# Patient Record
Sex: Male | Born: 1960 | Race: Black or African American | Hispanic: No | Marital: Single | Smoking: Never smoker
Health system: Southern US, Community
[De-identification: ages and names within clinical notes are randomized; demographics above are authoritative.]

## PROBLEM LIST (undated history)

## (undated) DIAGNOSIS — I10 Essential (primary) hypertension: Secondary | ICD-10-CM

## (undated) DIAGNOSIS — M1990 Unspecified osteoarthritis, unspecified site: Secondary | ICD-10-CM

## (undated) DIAGNOSIS — E785 Hyperlipidemia, unspecified: Secondary | ICD-10-CM

## (undated) DIAGNOSIS — K429 Umbilical hernia without obstruction or gangrene: Secondary | ICD-10-CM

## (undated) DIAGNOSIS — N183 Chronic kidney disease, stage 3 unspecified: Secondary | ICD-10-CM

## (undated) DIAGNOSIS — M109 Gout, unspecified: Secondary | ICD-10-CM

## (undated) DIAGNOSIS — G459 Transient cerebral ischemic attack, unspecified: Secondary | ICD-10-CM

## (undated) DIAGNOSIS — I351 Nonrheumatic aortic (valve) insufficiency: Secondary | ICD-10-CM

## (undated) DIAGNOSIS — I639 Cerebral infarction, unspecified: Secondary | ICD-10-CM

## (undated) DIAGNOSIS — R011 Cardiac murmur, unspecified: Secondary | ICD-10-CM

## (undated) HISTORY — DX: Chronic kidney disease, stage 3 unspecified: N18.30

## (undated) HISTORY — DX: Transient cerebral ischemic attack, unspecified: G45.9

## (undated) HISTORY — DX: Hyperlipidemia, unspecified: E78.5

## (undated) HISTORY — DX: Nonrheumatic aortic (valve) insufficiency: I35.1

---

## 1998-03-25 ENCOUNTER — Emergency Department (HOSPITAL_COMMUNITY): Admission: EM | Admit: 1998-03-25 | Discharge: 1998-03-25 | Payer: Self-pay | Admitting: Emergency Medicine

## 1998-10-22 ENCOUNTER — Encounter: Payer: Self-pay | Admitting: Emergency Medicine

## 1998-10-22 ENCOUNTER — Ambulatory Visit (HOSPITAL_BASED_OUTPATIENT_CLINIC_OR_DEPARTMENT_OTHER): Admission: RE | Admit: 1998-10-22 | Discharge: 1998-10-22 | Payer: Self-pay | Admitting: Orthopedic Surgery

## 1998-10-22 ENCOUNTER — Emergency Department (HOSPITAL_COMMUNITY): Admission: EM | Admit: 1998-10-22 | Discharge: 1998-10-22 | Payer: Self-pay | Admitting: Emergency Medicine

## 2002-03-07 ENCOUNTER — Encounter: Payer: Self-pay | Admitting: Emergency Medicine

## 2002-03-07 ENCOUNTER — Emergency Department (HOSPITAL_COMMUNITY): Admission: EM | Admit: 2002-03-07 | Discharge: 2002-03-07 | Payer: Self-pay | Admitting: Emergency Medicine

## 2002-03-12 ENCOUNTER — Emergency Department (HOSPITAL_COMMUNITY): Admission: EM | Admit: 2002-03-12 | Discharge: 2002-03-13 | Payer: Self-pay | Admitting: Emergency Medicine

## 2002-03-13 ENCOUNTER — Encounter: Payer: Self-pay | Admitting: Emergency Medicine

## 2002-11-12 ENCOUNTER — Emergency Department (HOSPITAL_COMMUNITY): Admission: AD | Admit: 2002-11-12 | Discharge: 2002-11-12 | Payer: Self-pay | Admitting: Emergency Medicine

## 2002-11-12 ENCOUNTER — Encounter: Payer: Self-pay | Admitting: Emergency Medicine

## 2004-05-13 ENCOUNTER — Emergency Department (HOSPITAL_COMMUNITY): Admission: EM | Admit: 2004-05-13 | Discharge: 2004-05-14 | Payer: Self-pay | Admitting: Emergency Medicine

## 2004-12-24 ENCOUNTER — Emergency Department (HOSPITAL_COMMUNITY): Admission: EM | Admit: 2004-12-24 | Discharge: 2004-12-24 | Payer: Self-pay | Admitting: Emergency Medicine

## 2005-03-04 ENCOUNTER — Emergency Department (HOSPITAL_COMMUNITY): Admission: EM | Admit: 2005-03-04 | Discharge: 2005-03-04 | Payer: Self-pay | Admitting: Emergency Medicine

## 2005-04-08 ENCOUNTER — Emergency Department (HOSPITAL_COMMUNITY): Admission: EM | Admit: 2005-04-08 | Discharge: 2005-04-08 | Payer: Self-pay | Admitting: Emergency Medicine

## 2005-04-08 IMAGING — DX DG ORTHOPANTOGRAM /PANORAMIC
1 series · 1 of 1 positions shown · non-contrast
Comparison: none

CLINICAL DATA: Left lower jaw toothache

ORTHOPANTOGRAM (PANOREX):

[view not recorded]
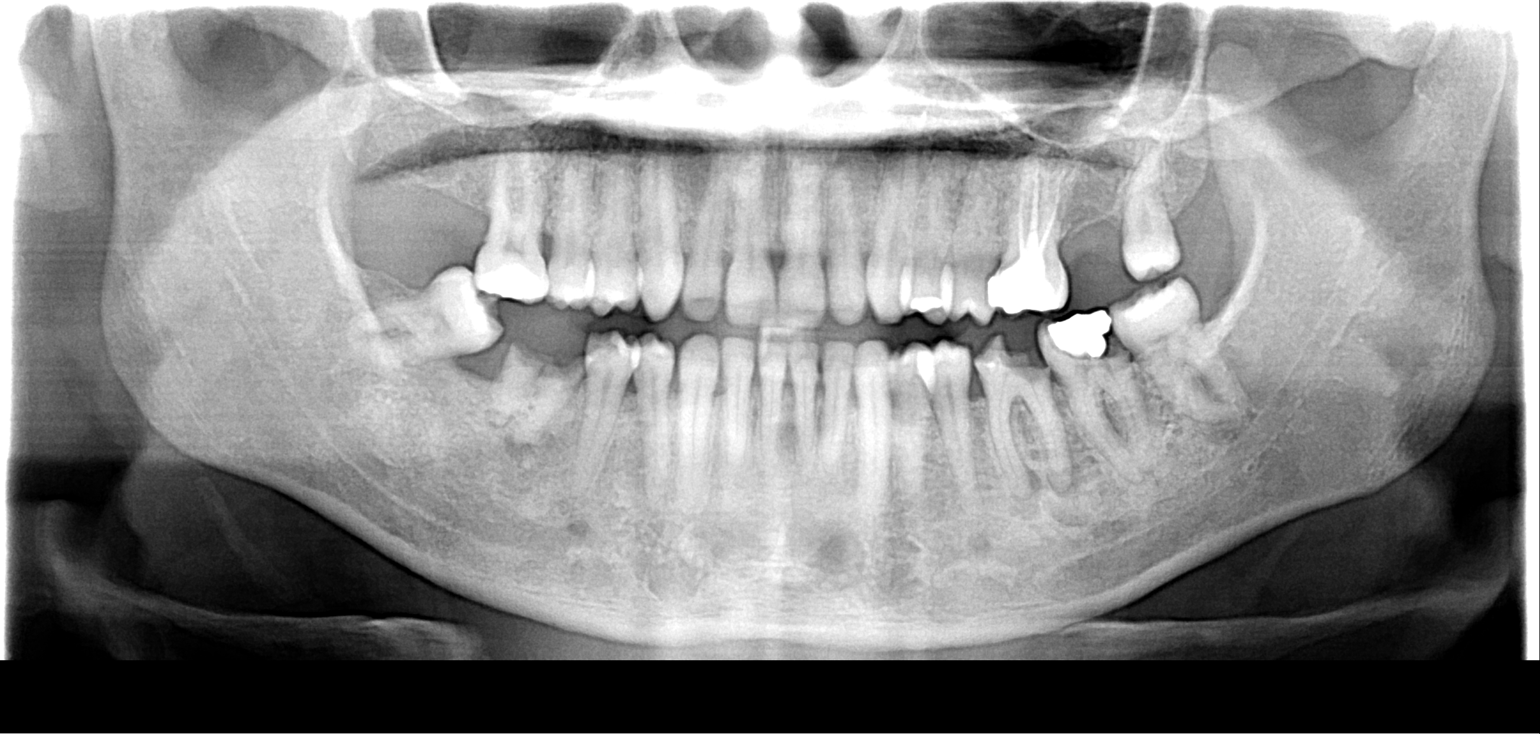

[1 of 1 positions shown; findings below may reference images not displayed]

FINDINGS: Multiple dental caries. There is slight lucency noted around teeth
#19 and 30, possibly early periapical abscesses. Otherwise no bony abnormality.
IMPRESSION: Question early periapical abscesses teeth #19 and 30.

## 2015-10-15 ENCOUNTER — Emergency Department (HOSPITAL_COMMUNITY): Payer: Worker's Compensation

## 2015-10-15 ENCOUNTER — Encounter (HOSPITAL_COMMUNITY): Payer: Self-pay | Admitting: *Deleted

## 2015-10-15 ENCOUNTER — Emergency Department (HOSPITAL_COMMUNITY)
Admission: EM | Admit: 2015-10-15 | Discharge: 2015-10-15 | Disposition: A | Discharge status: Home / Self Care | Payer: Worker's Compensation | Attending: Emergency Medicine | Admitting: Emergency Medicine

## 2015-10-15 DIAGNOSIS — I10 Essential (primary) hypertension: Secondary | ICD-10-CM | POA: Insufficient documentation

## 2015-10-15 DIAGNOSIS — W231XXA Caught, crushed, jammed, or pinched between stationary objects, initial encounter: Secondary | ICD-10-CM | POA: Insufficient documentation

## 2015-10-15 DIAGNOSIS — Y990 Civilian activity done for income or pay: Secondary | ICD-10-CM | POA: Diagnosis not present

## 2015-10-15 DIAGNOSIS — Z8739 Personal history of other diseases of the musculoskeletal system and connective tissue: Secondary | ICD-10-CM | POA: Diagnosis not present

## 2015-10-15 DIAGNOSIS — S6702XA Crushing injury of left thumb, initial encounter: Secondary | ICD-10-CM | POA: Insufficient documentation

## 2015-10-15 DIAGNOSIS — Y9289 Other specified places as the place of occurrence of the external cause: Secondary | ICD-10-CM | POA: Diagnosis not present

## 2015-10-15 DIAGNOSIS — Y9389 Activity, other specified: Secondary | ICD-10-CM | POA: Diagnosis not present

## 2015-10-15 DIAGNOSIS — S61012A Laceration without foreign body of left thumb without damage to nail, initial encounter: Secondary | ICD-10-CM | POA: Insufficient documentation

## 2015-10-15 DIAGNOSIS — S6992XA Unspecified injury of left wrist, hand and finger(s), initial encounter: Secondary | ICD-10-CM | POA: Diagnosis present

## 2015-10-15 HISTORY — DX: Gout, unspecified: M10.9

## 2015-10-15 HISTORY — DX: Essential (primary) hypertension: I10

## 2015-10-15 IMAGING — DX DG FINGER THUMB 2+V*L*
3 series · 3 of 3 positions shown · non-contrast
Comparison: None.

CLINICAL DATA: Smashed finger between metal blocks at work today.
Open wound on left thumb.

EXAM:
LEFT THUMB 2+V

[finger ap]
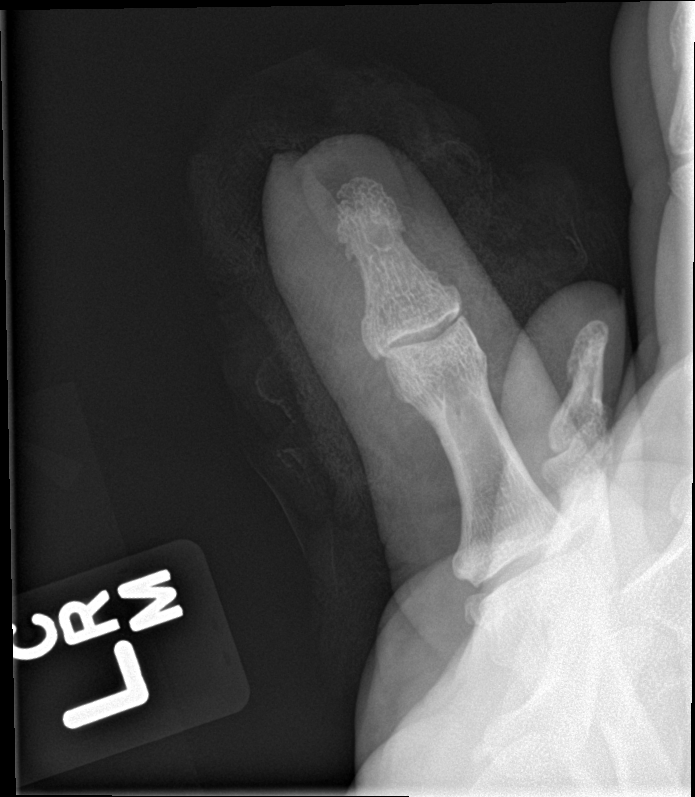

[finger obl]
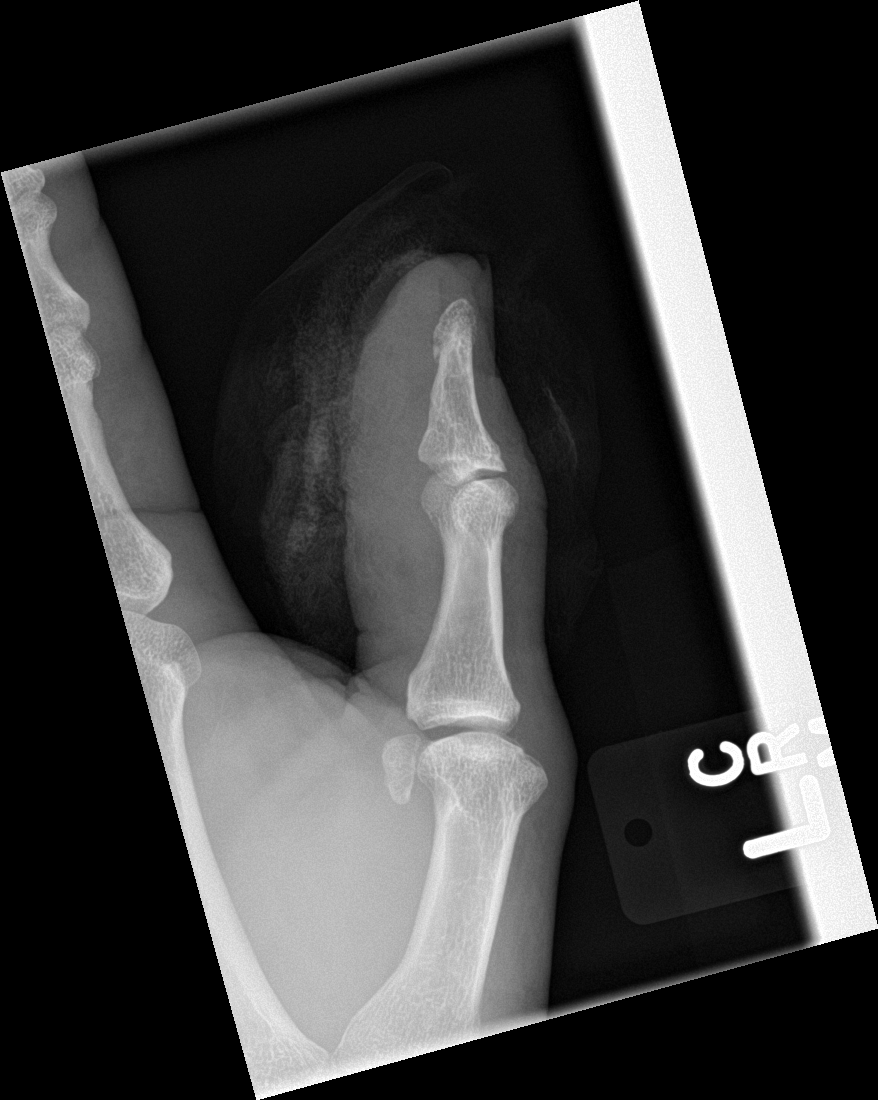

[finger lat]
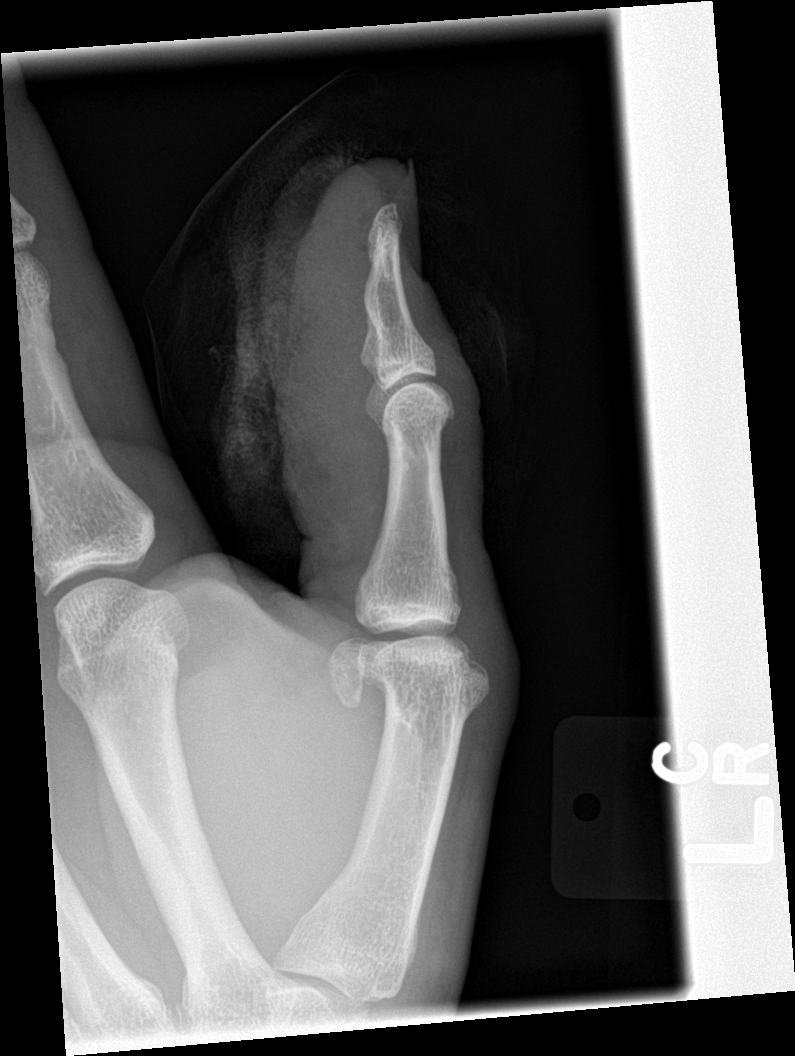

[3 of 3 positions shown; findings below may reference images not displayed]

FINDINGS: Soft tissue defect at the tip of the left thumb. No underlying acute
bony abnormality. No fracture, subluxation or dislocation.
IMPRESSION: No acute bony abnormality.

## 2015-10-15 MED ORDER — LIDOCAINE HCL (PF) 1 % IJ SOLN
10.0000 mL | Freq: Once | INTRAMUSCULAR | Status: AC
  Administered 2015-10-15: 10 mL
  Filled 2015-10-15: qty 10

## 2015-10-15 MED ORDER — CEPHALEXIN 250 MG PO CAPS
500.0000 mg | ORAL_CAPSULE | Freq: Once | ORAL | Status: AC
  Administered 2015-10-15: 500 mg via ORAL
  Filled 2015-10-15: qty 2

## 2015-10-15 MED ORDER — MORPHINE SULFATE (PF) 4 MG/ML IV SOLN
4.0000 mg | Freq: Once | INTRAVENOUS | Status: AC
  Administered 2015-10-15: 4 mg via INTRAMUSCULAR
  Filled 2015-10-15: qty 1

## 2015-10-15 MED ORDER — ONDANSETRON 4 MG PO TBDP: 4.0000 mg | ORAL_TABLET | Freq: Once | ORAL | Status: DC

## 2015-10-15 MED ORDER — AMLODIPINE BESYLATE 10 MG PO TABS: 10.0000 mg | ORAL_TABLET | Freq: Every day | ORAL | Status: DC

## 2015-10-15 MED ORDER — OXYCODONE-ACETAMINOPHEN 5-325 MG PO TABS: ORAL_TABLET | ORAL | Status: DC

## 2015-10-15 MED ORDER — CEPHALEXIN 500 MG PO CAPS: 500.0000 mg | ORAL_CAPSULE | Freq: Four times a day (QID) | ORAL | Status: DC

## 2015-10-15 NOTE — ED Notes (Signed)
Pt ambulates independently and with steady gait at time of discharge. Discharge instructions and follow up information reviewed with patient. No other questions or concerns voiced at this time. RX x 3 given. 

## 2015-10-15 NOTE — ED Notes (Signed)
Pt reports smashing his finger betwenn blocks while at work today. Pt noted to have open wound to left thumb with bleeding in triage. Dressing applied in triage.

## 2015-10-15 NOTE — ED Provider Notes (Signed)
CSN: [id number]     Arrival date & time 10/15/15  [id number] History   First MD Initiated Contact with Patient 10/15/15 0745     Chief Complaint  Patient presents with  . Finger Injury     (Consider location/radiation/quality/duration/timing/severity/associated sxs/prior Treatment) HPI   Blood pressure 205/136, pulse 81, temperature 97.9 F (36.6 C), temperature source Oral, resp. rate 18, height 5\' 7"  (1.702 m), weight 90.719 kg, SpO2 96 %.  Carlos Hayes is a 55 y.o. male is right-hand-dominant complaining of pain and laceration to left thumb after digit was impacted with a manhole cover just prior to arrival. Bleeding is controlled, patient denies numbness, weakness, reduced range of motion. States last tetanus shot was within the last 5 years.  Past Medical History  Diagnosis Date  . Hypertension   . Gout    History reviewed. No pertinent past surgical history. No family history on file. Social History  Substance Use Topics  . Smoking status: Never Smoker   . Smokeless tobacco: None  . Alcohol Use: None    Review of Systems  10 systems reviewed and found to be negative, except as noted in the HPI.   Allergies  Review of patient's allergies indicates no known allergies.  Home Medications   Prior to Admission medications   Not on File   BP 205/136 mmHg  Pulse 81  Temp(Src) 97.9 F (36.6 C) (Oral)  Resp 18  Ht 5\' 7"  (1.702 m)  Wt 90.719 kg  BMI 31.32 kg/m2  SpO2 96% Physical Exam  Constitutional: He is oriented to person, place, and time. He appears well-developed and well-nourished. No distress.  HENT:  Head: Normocephalic.  Eyes: Conjunctivae and EOM are normal.  Cardiovascular: Normal rate.   Pulmonary/Chest: Effort normal and breath sounds normal. No stridor.  Musculoskeletal: Normal range of motion.       Hands: 3 cm flap-like laceration to radial aspect of left thumb as diagrammed, patient has excellent range of motion to the interphalangeal joint in  flexion and extension, is able to differentiate between pinprick and light touch. Bleeding is controlled. Patient also has 21 cm lacerations as diagrammed. No nailbed involvement or subungual hematoma.  Neurological: He is alert and oriented to person, place, and time.  Psychiatric: He has a normal mood and affect.  Nursing note and vitals reviewed.   ED Course  .Marland KitchenLaceration Repair Date/Time: 10/15/2015 10:46 AM Performed by: Monico Blitz Authorized by: Monico Blitz Consent: Verbal consent obtained. Risks and benefits: risks, benefits and alternatives were discussed Consent given by: patient Patient identity confirmed: verbally with patient Body area: upper extremity Location details: left thumb Laceration length: 3 cm Foreign bodies: no foreign bodies Tendon involvement: none Nerve involvement: none Vascular damage: no Anesthesia: digital block Anesthetic total: 6 ml Patient sedated: no Preparation: Patient was prepped and draped in the usual sterile fashion. Irrigation solution: saline Irrigation method: syringe Amount of cleaning: extensive Debridement: moderate Degree of undermining: none Skin closure: Ethilon (4-0) Number of sutures: 8 Technique: simple Approximation: loose Approximation difficulty: complex Dressing: antibiotic ointment, non-adhesive packing strip and splint Patient tolerance: Patient tolerated the procedure well with no immediate complications    .Marland KitchenLaceration Repair Date/Time: 10/15/2015 10:46 AM Performed by: Monico Blitz Authorized by: Monico Blitz Consent: Verbal consent obtained. Risks and benefits: risks, benefits and alternatives were discussed Consent given by: patient Patient identity confirmed: verbally with patient Body area: upper extremity Location details: left thumb Laceration length: 1 cm Foreign bodies: no foreign bodies Tendon involvement: none Nerve  involvement: none Vascular damage: no Anesthesia:  digital block Anesthetic total: 6 ml Patient sedated: no Preparation: Patient was prepped and draped in the usual sterile fashion. Irrigation solution: saline Irrigation method: syringe Amount of cleaning: extensive Debridement: moderate Degree of undermining: none Skin closure: Ethilon (4-0) Number of sutures: 1 Technique: simple Approximation: loose Approximation difficulty: complex Dressing: antibiotic ointment, non-adhesive packing strip and splint Patient tolerance: Patient tolerated the procedure well with no immediate complications  Laceration Repair Date/Time: 10/15/2015 10:46 AM Performed by: Monico Blitz Authorized by: Monico Blitz Consent: Verbal consent obtained. Risks and benefits: risks, benefits and alternatives were discussed Consent given by: patient Patient identity confirmed: verbally with patient Body area: upper extremity Location details: left thumb Laceration length: 1 cm Foreign bodies: no foreign bodies Tendon involvement: none Nerve involvement: none Vascular damage: no Anesthesia: digital block Anesthetic total: 6 ml Patient sedated: no Preparation: Patient was prepped and draped in the usual sterile fashion. Irrigation solution: saline Irrigation method: syringe Amount of cleaning: extensive Debridement: moderate Degree of undermining: none Skin closure: Ethilon (4-0) Number of sutures: 1 Technique: simple Approximation: loose Approximation difficulty: complex Dressing: antibiotic ointment, non-adhesive packing strip and splint Patient tolerance: Patient tolerated the procedure well with no immediate complications  Labs Review Labs Reviewed - No data to display  Imaging Review Dg Finger Thumb Left  10/15/2015  CLINICAL DATA:  Smashed finger between metal blocks at work today. Open wound on left thumb. EXAM: LEFT THUMB 2+V COMPARISON:  None. FINDINGS: Soft tissue defect at the tip of the left thumb. No underlying acute bony  abnormality. No fracture, subluxation or dislocation. IMPRESSION: No acute bony abnormality. Electronically Signed   By: Rolm Baptise M.D.   On: 10/15/2015 08:14   I have personally reviewed and evaluated these images and lab results as part of my medical decision-making.   EKG Interpretation None      MDM   Final diagnoses:  Crushing injury of left thumb, initial encounter  Thumb laceration, left, initial encounter    Filed Vitals:   10/15/15 0734 10/15/15 0735 10/15/15 0736  BP:  205/136   Pulse: 81    Temp: 97.9 F (36.6 C)    TempSrc: Oral    Resp: 18    Height: 5\' 7"  (1.702 m)    Weight: 77.111 kg  90.719 kg  SpO2: 96%      Medications  cephALEXin (KEFLEX) capsule 500 mg (not administered)  ondansetron (ZOFRAN-ODT) disintegrating tablet 4 mg (not administered)  morphine 4 MG/ML injection 4 mg (4 mg Intramuscular Given 10/15/15 0933)  lidocaine (PF) (XYLOCAINE) 1 % injection 10 mL (10 mLs Other Given 10/15/15 0933)    Carlos Hayes is 55 y.o. male presenting with injury and laceration to nondominant thumb. Tendon appears intact, x-rays negative for fracture. States tetanus shot is up-to-date within the last 5 years. Wound is cleaned and closed patient is placed in a splint and a sling, extensive discussion of return precautions. Patient is started on antibiotics and given Xeroform to dress the wound.  This is a shared visit with the attending physician who personally evaluated the patient and agrees with the care plan.   She states has been out of his high blood pressure medications for greater than 2 months. He denies chest pain, shortness of breath, nausea, vomiting. Asymptomatic HTN, will refill amlodipine.   Evaluation does not show pathology that would require ongoing emergent intervention or inpatient treatment. Pt is hemodynamically stable and mentating appropriately. Discussed findings and plan with  patient/guardian, who agrees with care plan. All questions  answered. Return precautions discussed and outpatient follow up given.   New Prescriptions   AMLODIPINE (NORVASC) 10 MG TABLET    Take 1 tablet (10 mg total) by mouth daily.   CEPHALEXIN (KEFLEX) 500 MG CAPSULE    Take 1 capsule (500 mg total) by mouth 4 (four) times daily.   OXYCODONE-ACETAMINOPHEN (PERCOCET/ROXICET) 5-325 MG TABLET    1 to 2 tabs PO q6hrs  PRN for pain         Monico Blitz, PA-C 10/15/15 [place], MD 10/21/15 2015

## 2015-10-15 NOTE — ED Provider Notes (Signed)
Patient examined with, and discussed with Elmyra Ricks Pisciotta PA-C.  Exam shows soft tissue avulsion of the volar aspect of the thumb. Flexion extension intact at the IP. No subungual hematoma. X-ray show no bony or joint abnormalities. Plan is soft tissue repair  Tanna Furry, MD 10/15/15 1027

## 2015-10-15 NOTE — Discharge Instructions (Signed)
If you see signs of infection (warmth, redness, tenderness, pus, sharp increase in pain, fever) immediately return to the emergency department.  Take your antibiotics as directed   1) Wash gently morning and night with soap and water. 2) You may use a topical antibiotic ointment (bacitracin triple antibiotic ointment or neosporin, for example) 3) Cover all exposed tissue with the Xeroform petroleum dressing you were given today 4) Apply gauze and tape   Do NOT use rubbing alcohol or hydrogen peroxide, do not soak the area   Present to your primary care doctor or the urgent care of your choice, or the ED for suture removal in 10 days.   Every attempt was made to remove foreign body (contaminants) from the wound.  However, there is always a chance that some may remain in the wound. This can  increase your risk of infection.   If you see signs of infection (warmth, redness, tenderness, pus, sharp increase in pain, fever, red streaking in the skin) immediately return to the emergency department.    Crush Injury, Fingers or Toes A crush injury means the fingers or toes are hurt by being squeezed (compressed). HOME CARE  Raise (elevate) the injured part above the level of your heart. Do this as much as you can for the first few days.  Put ice on the injured area.  Put ice in a plastic bag.  Place a towel between your skin and the bag.  Leave the ice on for 15-20 minutes, 03-04 times a day for the first 2 days.  Only take medicine as told by your doctor.  Use the injured part only as told by your doctor.  Change bandages (dressings) as told by your doctor.  Keep all doctor visits as told. GET HELP RIGHT AWAY IF:   There is redness, puffiness (swelling), or more pain in the injured finger or toe.  Yellowish-white fluid (pus) comes from the wound.  You have a fever.  A bad smell comes from the wound or bandage.  The wound breaks open.  You cannot move the injured finger or  toe. MAKE SURE YOU:   Understand these instructions.  Will watch your condition.  Will get help right away if you are not doing well or get worse.   This information is not intended to replace advice given to you by your health care provider. Make sure you discuss any questions you have with your health care provider.   Document Released: 01/13/2010 Document Revised: 10/18/2011 Document Reviewed: 12/11/2010 Elsevier Interactive Patient Education [employer].

## 2017-03-10 ENCOUNTER — Encounter (HOSPITAL_COMMUNITY): Payer: Self-pay | Admitting: Emergency Medicine

## 2017-03-10 ENCOUNTER — Emergency Department (HOSPITAL_COMMUNITY)
Admission: EM | Admit: 2017-03-10 | Discharge: 2017-03-10 | Disposition: A | Discharge status: Home / Self Care | Payer: 59 | Attending: Emergency Medicine | Admitting: Emergency Medicine

## 2017-03-10 DIAGNOSIS — I10 Essential (primary) hypertension: Secondary | ICD-10-CM | POA: Insufficient documentation

## 2017-03-10 DIAGNOSIS — K429 Umbilical hernia without obstruction or gangrene: Secondary | ICD-10-CM | POA: Diagnosis not present

## 2017-03-10 LAB — BASIC METABOLIC PANEL
ANION GAP: 8 (ref 5–15)
BUN: 13 mg/dL (ref 6–20)
CALCIUM: 9.1 mg/dL (ref 8.9–10.3)
CO2: 27 mmol/L (ref 22–32)
Chloride: 100 mmol/L — ABNORMAL LOW (ref 101–111)
Creatinine, Ser: 1.26 mg/dL — ABNORMAL HIGH (ref 0.61–1.24)
Glucose, Bld: 102 mg/dL — ABNORMAL HIGH (ref 65–99)
POTASSIUM: 3.4 mmol/L — AB (ref 3.5–5.1)
Sodium: 135 mmol/L (ref 135–145)

## 2017-03-10 LAB — CBC WITH DIFFERENTIAL/PLATELET
BASOS ABS: 0 10*3/uL (ref 0.0–0.1)
BASOS PCT: 0 %
Eosinophils Absolute: 0.3 10*3/uL (ref 0.0–0.7)
Eosinophils Relative: 6 %
HEMATOCRIT: 45.3 % (ref 39.0–52.0)
Hemoglobin: 14.7 g/dL (ref 13.0–17.0)
LYMPHS PCT: 47 %
Lymphs Abs: 2.6 10*3/uL (ref 0.7–4.0)
MCH: 26.3 pg (ref 26.0–34.0)
MCHC: 32.5 g/dL (ref 30.0–36.0)
MCV: 81 fL (ref 78.0–100.0)
MONO ABS: 0.6 10*3/uL (ref 0.1–1.0)
Monocytes Relative: 10 %
NEUTROS ABS: 2.1 10*3/uL (ref 1.7–7.7)
NEUTROS PCT: 37 %
Platelets: 224 10*3/uL (ref 150–400)
RBC: 5.59 MIL/uL (ref 4.22–5.81)
RDW: 13.7 % (ref 11.5–15.5)
WBC: 5.7 10*3/uL (ref 4.0–10.5)

## 2017-03-10 LAB — I-STAT TROPONIN, ED: Troponin i, poc: 0.01 ng/mL (ref 0.00–0.08)

## 2017-03-10 MED ORDER — AMLODIPINE BESYLATE 5 MG PO TABS
10.0000 mg | ORAL_TABLET | Freq: Once | ORAL | Status: AC
  Administered 2017-03-10: 10 mg via ORAL
  Filled 2017-03-10: qty 2

## 2017-03-10 MED ORDER — AMLODIPINE BESYLATE 10 MG PO TABS: 10.0000 mg | ORAL_TABLET | Freq: Every day | ORAL | 0 refills | Status: DC

## 2017-03-10 MED ORDER — HYDROCHLOROTHIAZIDE 12.5 MG PO CAPS
12.5000 mg | ORAL_CAPSULE | Freq: Once | ORAL | Status: AC
  Administered 2017-03-10: 12.5 mg via ORAL
  Filled 2017-03-10: qty 1

## 2017-03-10 MED ORDER — HYDROCHLOROTHIAZIDE 12.5 MG PO CAPS: 12.5000 mg | ORAL_CAPSULE | Freq: Every day | ORAL | 0 refills | Status: DC

## 2017-03-10 NOTE — ED Notes (Signed)
Patient verbalized understanding of discharge instructions and denies any further needs or questions at this time. Patient states he will be compliant with taking his prescribed BP medications and will call for follow-up appointments tomorrow. Patient ambulatory with steady gait. RN escorted to ED entrance.

## 2017-03-10 NOTE — ED Triage Notes (Signed)
Pt st's he has had a unbiblical hernia for over a  Year.  St's it started draining 3 months ago with a foul odor.  Pt denies any pain.,

## 2017-03-10 NOTE — ED Notes (Signed)
MD aware of patient's blood pressure. Patient denies any symptoms.

## 2017-03-10 NOTE — ED Provider Notes (Signed)
[facility] DEPT Provider Note   CSN: [id number] Arrival date & time: 03/10/17  1843     History   Chief Complaint Chief Complaint  Patient presents with  . Hernia     HPI Carlos Hayes is a 56 y.o. Male who presents with complaints with high blood pressure. He has  Not been taking Norvasc and HCTZ for the past year. Also has umbilical hernia for the past 3-4 years. No acute changes in terms of pain, discharge. His cousin told him the hernia "might bust" and "should get it checked out". He denies any headaches, changes in vision, chest pain, shortness of breath, palpitations, numbness/tingling, or weakness.   Past Medical History:  Diagnosis Date  . Gout   . Hypertension     There are no active problems to display for this patient.   History reviewed. No pertinent surgical history.     Home Medications    Prior to Admission medications   Medication Sig Start Date End Date Taking? Authorizing Provider  amLODipine (NORVASC) 10 MG tablet Take 1 tablet (10 mg total) by mouth daily. 03/10/17   Arnetha Massy, MD  hydrochlorothiazide (MICROZIDE) 12.5 MG capsule Take 1 capsule (12.5 mg total) by mouth daily. 03/10/17   Arnetha Massy, MD    Family History No family history on file.  Social History Social History  Substance Use Topics  . Smoking status: Never Smoker  . Smokeless tobacco: Never Used  . Alcohol use Yes     Allergies   Patient has no known allergies.   Review of Systems Review of Systems  Constitutional: Negative for chills and fever.  HENT: Negative for ear pain and sore throat.   Eyes: Negative for pain and visual disturbance.  Respiratory: Negative for cough and shortness of breath.   Cardiovascular: Negative for chest pain and palpitations.  Gastrointestinal: Negative for abdominal pain and vomiting.       Umbilical hernia, reducable  Genitourinary: Negative for dysuria and hematuria.  Musculoskeletal: Negative for arthralgias and back  pain.  Skin: Negative for color change and rash.  Neurological: Negative for seizures and syncope.  All other systems reviewed and are negative.    Physical Exam Updated Vital Signs BP (!) 211/134   Pulse 61   Temp 99.1 F (37.3 C) (Oral)   Resp 17   SpO2 99%   Physical Exam  Constitutional: He appears well-developed and well-nourished.  HENT:  Head: Normocephalic and atraumatic.  Eyes: Conjunctivae are normal.  Neck: Neck supple.  Cardiovascular: Normal rate and regular rhythm.   No murmur heard. Pulmonary/Chest: Effort normal and breath sounds normal. No respiratory distress.  Abdominal: Soft. There is no tenderness. A hernia (umbilical, reducible) is present.  Musculoskeletal: He exhibits no edema.  Neurological: He is alert.  Skin: Skin is warm and dry.  Psychiatric: He has a normal mood and affect.  Nursing note and vitals reviewed.    ED Treatments / Results  Labs (all labs ordered are listed, but only abnormal results are displayed) Labs Reviewed  BASIC METABOLIC PANEL - Abnormal; Notable for the following:       Result Value   Potassium 3.4 (*)    Chloride 100 (*)    Glucose, Bld 102 (*)    Creatinine, Ser 1.26 (*)    All other components within normal limits  CBC WITH DIFFERENTIAL/PLATELET  I-STAT TROPONIN, ED    EKG  EKG Interpretation None       Radiology No results found.  Procedures Procedures (  including critical care time)  Medications Ordered in ED Medications  amLODipine (NORVASC) tablet 10 mg (10 mg Oral Given 03/10/17 2238)  hydrochlorothiazide (MICROZIDE) capsule 12.5 mg (12.5 mg Oral Given 03/10/17 2238)     Initial Impression / Assessment and Plan / ED Course  I have reviewed the triage vital signs and the nursing notes.  Pertinent labs & imaging results that were available during my care of the patient were reviewed by me and considered in my medical decision making (see chart for details).    Patient presented with  asymptomatic hypertension, off meds for past year. Labs and EKG reassuring. Given dose of previous meds, Norvasc and HCTZ. No evidence of hypertensive emergency/urgency. No indication for further work up at this time. Given rx for BP meds, and given contact for [facility] referral.   Umbilical hernia reducible on exam with signs of incarceration or strangulation. Given follow up for General Surgery for further evaluation. Return precautions discussed.   Final Clinical Impressions(s) / ED Diagnoses   Final diagnoses:  Umbilical hernia without obstruction and without gangrene  Hypertension, unspecified type    New Prescriptions New Prescriptions   AMLODIPINE (NORVASC) 10 MG TABLET    Take 1 tablet (10 mg total) by mouth daily.   HYDROCHLOROTHIAZIDE (MICROZIDE) 12.5 MG CAPSULE    Take 1 capsule (12.5 mg total) by mouth daily.     Arnetha Massy, MD 03/10/17 7998    Jola Schmidt, MD 03/11/17 [phone]

## 2017-03-10 NOTE — Discharge Instructions (Signed)
Your work up was reassuring today. Please follow up with primary care provider for your high blood pressure.   Call number provided to discuss with Surgery your umbilical hernia. Return to ED with any pain, inability to reduce hernia, or any new abdominal complaints.

## 2017-03-10 NOTE — ED Notes (Signed)
Patient ambulatory to restroom and back with steady gait. Denies dizziness, headache.

## 2018-07-01 ENCOUNTER — Encounter (HOSPITAL_COMMUNITY): Payer: Self-pay | Admitting: *Deleted

## 2018-07-01 ENCOUNTER — Emergency Department (HOSPITAL_COMMUNITY)
Admission: EM | Admit: 2018-07-01 | Discharge: 2018-07-01 | Disposition: A | Discharge status: Home / Self Care | Payer: 59 | Attending: Emergency Medicine | Admitting: Emergency Medicine

## 2018-07-01 DIAGNOSIS — Z79899 Other long term (current) drug therapy: Secondary | ICD-10-CM | POA: Insufficient documentation

## 2018-07-01 DIAGNOSIS — M25562 Pain in left knee: Secondary | ICD-10-CM | POA: Diagnosis present

## 2018-07-01 DIAGNOSIS — I10 Essential (primary) hypertension: Secondary | ICD-10-CM | POA: Diagnosis not present

## 2018-07-01 LAB — I-STAT CHEM 8, ED
BUN: 14 mg/dL (ref 6–20)
CREATININE: 1.6 mg/dL — AB (ref 0.61–1.24)
Calcium, Ion: 1.16 mmol/L (ref 1.15–1.40)
Chloride: 105 mmol/L (ref 98–111)
GLUCOSE: 100 mg/dL — AB (ref 70–99)
HCT: 44 % (ref 39.0–52.0)
HEMOGLOBIN: 15 g/dL (ref 13.0–17.0)
POTASSIUM: 3.5 mmol/L (ref 3.5–5.1)
Sodium: 140 mmol/L (ref 135–145)
TCO2: 27 mmol/L (ref 22–32)

## 2018-07-01 MED ORDER — AMLODIPINE BESYLATE 5 MG PO TABS
10.0000 mg | ORAL_TABLET | Freq: Every day | ORAL | Status: DC
Start: 2018-07-01 — End: 2018-07-02
  Administered 2018-07-01: 10 mg via ORAL
  Filled 2018-07-01: qty 2

## 2018-07-01 MED ORDER — LABETALOL HCL 5 MG/ML IV SOLN: 10.0000 mg | Freq: Once | INTRAVENOUS | Status: DC

## 2018-07-01 MED ORDER — HYDROCHLOROTHIAZIDE 12.5 MG PO CAPS: 25.0000 mg | ORAL_CAPSULE | Freq: Every day | ORAL | 0 refills | Status: DC

## 2018-07-01 MED ORDER — HYDROCHLOROTHIAZIDE 12.5 MG PO CAPS
12.5000 mg | ORAL_CAPSULE | Freq: Every day | ORAL | Status: DC
  Administered 2018-07-01: 12.5 mg via ORAL
  Filled 2018-07-01: qty 1

## 2018-07-01 MED ORDER — COLCHICINE 0.6 MG PO TABS: 0.6000 mg | ORAL_TABLET | Freq: Every day | ORAL | 0 refills | Status: DC

## 2018-07-01 MED ORDER — AMLODIPINE BESYLATE 10 MG PO TABS: 10.0000 mg | ORAL_TABLET | Freq: Every day | ORAL | 0 refills | Status: DC

## 2018-07-01 NOTE — ED Provider Notes (Signed)
[facility] EMERGENCY DEPARTMENT Provider Note   CSN: [id number] Arrival date & time: 07/01/18  1855     History   Chief Complaint Chief Complaint  Patient presents with  . Knee Pain  . Hypertension    HPI Carlos Hayes is a 57 y.o. male.  The history is provided by the patient and medical records. No language interpreter was used.  Knee Pain   This is a recurrent problem. The current episode started more than 1 week ago. The problem occurs constantly. The problem has not changed since onset.The pain is present in the left knee. The pain is mild. Associated symptoms include stiffness. Pertinent negatives include no numbness and full range of motion. He has tried OTC pain medications for the symptoms. The treatment provided mild relief. There has been no history of extremity trauma.    Past Medical History:  Diagnosis Date  . Gout   . Hypertension     There are no active problems to display for this patient.   History reviewed. No pertinent surgical history.      Home Medications    Prior to Admission medications   Medication Sig Start Date End Date Taking? Authorizing Provider  amLODipine (NORVASC) 10 MG tablet Take 1 tablet (10 mg total) by mouth daily. 03/10/17   Arnetha Massy, MD  hydrochlorothiazide (MICROZIDE) 12.5 MG capsule Take 1 capsule (12.5 mg total) by mouth daily. 03/10/17   Arnetha Massy, MD    Family History History reviewed. No pertinent family history.  Social History Social History   Tobacco Use  . Smoking status: Never Smoker  . Smokeless tobacco: Never Used  Substance Use Topics  . Alcohol use: Yes  . Drug use: No     Allergies   Patient has no known allergies.   Review of Systems Review of Systems  Constitutional: Negative for activity change, appetite change, chills and fever.  HENT: Negative for ear pain and sore throat.   Eyes: Negative for pain and visual disturbance.  Respiratory: Negative for  cough and shortness of breath.   Cardiovascular: Negative for chest pain and palpitations.  Gastrointestinal: Negative for abdominal pain and vomiting.  Genitourinary: Negative for dysuria and hematuria.  Musculoskeletal: Positive for stiffness. Negative for arthralgias and back pain.  Skin: Negative for color change and rash.  Neurological: Negative for seizures, syncope and numbness.  All other systems reviewed and are negative.    Physical Exam Updated Vital Signs BP (!) 244/143 Comment: MD aware  Pulse 91 Comment: MD aware  Temp 99.5 F (37.5 C) (Oral)   Resp 20 Comment: MD aware  SpO2 99% Comment: MD aware  Physical Exam  Constitutional: He appears well-developed and well-nourished.  HENT:  Head: Normocephalic and atraumatic.  Eyes: Conjunctivae are normal.  Neck: Neck supple.  Cardiovascular: Normal rate and regular rhythm.  No murmur heard. Pulmonary/Chest: Effort normal and breath sounds normal. No respiratory distress.  Abdominal: Soft. There is no tenderness.  Musculoskeletal: He exhibits no edema.       Left knee: He exhibits swelling. He exhibits normal range of motion, no ecchymosis, no deformity and no erythema. No tenderness found.  Neurological: He is alert.  Skin: Skin is warm and dry.  Psychiatric: He has a normal mood and affect.  Nursing note and vitals reviewed.  ED Treatments / Results  Labs (all labs ordered are listed, but only abnormal results are displayed) Labs Reviewed  I-STAT CHEM 8, ED - Abnormal; Notable for the following components:  Result Value   Creatinine, Ser 1.60 (*)    Glucose, Bld 100 (*)    All other components within normal limits    EKG None  Radiology No results found.  Procedures Procedures (including critical care time)  Medications Ordered in ED Medications  amLODipine (NORVASC) tablet 10 mg (10 mg Oral Given 07/01/18 2019)  hydrochlorothiazide (MICROZIDE) capsule 12.5 mg (12.5 mg Oral Given 07/01/18 2020)       Initial Impression / Assessment and Plan / ED Course  I have reviewed the triage vital signs and the nursing notes.  Pertinent labs & imaging results that were available during my care of the patient were reviewed by me and considered in my medical decision making (see chart for details).     Patient is a 57 y.o. male with PMHX of HTN, not taking home medications & gout presenting for L knee swelling and high blood pressure. Patient states that he has been taking ibuprofen without relief. Blood pressure is elevation, patient is asymptomatic.  Labs show upward trend of creatinine from previous check. Home BP medications were given with small response of BP. Patient counseled that he needs to see a new PCP as soon as possible or return to the ED or urgent care for BP check. Patient counseled to not take ibuprofen for pain and given prescription for colchicine. Unlikely to be septic joint as pain can move knee without pain, not febrile, no other signs of sepsis.  Strict return precautions were discussed. Patient safe for discharge.   Final Clinical Impressions(s) / ED Diagnoses   Final diagnoses:  Hypertension, unspecified type    ED Discharge Orders    None       Erskine Squibb, MD 07/01/18 2214    Varney Biles, MD 07/02/18 5456

## 2018-07-01 NOTE — ED Notes (Signed)
Patient verbalizes understanding of discharge instructions. Opportunity for questioning and answers were provided. Armband removed by staff, pt discharged from ED, ambulatory to the lobby.

## 2018-07-01 NOTE — ED Notes (Signed)
Multiple save tubes for addt'l bloodwork.

## 2018-07-01 NOTE — ED Triage Notes (Addendum)
Pt reports left knee pain, hx of gout. Hx of htn and off meds x 6 months.

## 2020-03-04 ENCOUNTER — Other Ambulatory Visit: Payer: Self-pay

## 2020-03-04 ENCOUNTER — Emergency Department (HOSPITAL_COMMUNITY)
Admission: EM | Admit: 2020-03-04 | Discharge: 2020-03-05 | Disposition: A | Discharge status: Home / Self Care | Payer: 59 | Attending: Emergency Medicine | Admitting: Emergency Medicine

## 2020-03-04 DIAGNOSIS — J189 Pneumonia, unspecified organism: Secondary | ICD-10-CM | POA: Insufficient documentation

## 2020-03-04 DIAGNOSIS — Z20822 Contact with and (suspected) exposure to covid-19: Secondary | ICD-10-CM | POA: Diagnosis not present

## 2020-03-04 DIAGNOSIS — Z79899 Other long term (current) drug therapy: Secondary | ICD-10-CM | POA: Insufficient documentation

## 2020-03-04 DIAGNOSIS — R05 Cough: Secondary | ICD-10-CM | POA: Diagnosis present

## 2020-03-04 DIAGNOSIS — I10 Essential (primary) hypertension: Secondary | ICD-10-CM | POA: Insufficient documentation

## 2020-03-04 DIAGNOSIS — Z9119 Patient's noncompliance with other medical treatment and regimen: Secondary | ICD-10-CM | POA: Diagnosis not present

## 2020-03-04 LAB — BASIC METABOLIC PANEL
Anion gap: 13 (ref 5–15)
BUN: 20 mg/dL (ref 6–20)
CO2: 24 mmol/L (ref 22–32)
Calcium: 9 mg/dL (ref 8.9–10.3)
Chloride: 103 mmol/L (ref 98–111)
Creatinine, Ser: 1.49 mg/dL — ABNORMAL HIGH (ref 0.61–1.24)
GFR calc Af Amer: 60 mL/min — ABNORMAL LOW (ref >60)
GFR calc non Af Amer: 51 mL/min — ABNORMAL LOW (ref >60)
Glucose, Bld: 93 mg/dL (ref 70–99)
Potassium: 3.5 mmol/L (ref 3.5–5.1)
Sodium: 140 mmol/L (ref 135–145)

## 2020-03-04 LAB — CBC
HCT: 44.3 % (ref 39.0–52.0)
Hemoglobin: 13.1 g/dL (ref 13.0–17.0)
MCH: 24.7 pg — ABNORMAL LOW (ref 26.0–34.0)
MCHC: 29.6 g/dL — ABNORMAL LOW (ref 30.0–36.0)
MCV: 83.4 fL (ref 80.0–100.0)
Platelets: 233 10*3/uL (ref 150–400)
RBC: 5.31 MIL/uL (ref 4.22–5.81)
RDW: 13.8 % (ref 11.5–15.5)
WBC: 8.7 10*3/uL (ref 4.0–10.5)
nRBC: 0 % (ref 0.0–0.2)

## 2020-03-04 NOTE — ED Triage Notes (Signed)
Pt arrives to ED with c/o of cough for over 1 week. Pt is noted to be extremely Hypertensive in triage, out of meds for over 1 year.

## 2020-03-05 ENCOUNTER — Emergency Department (HOSPITAL_COMMUNITY): Payer: 59

## 2020-03-05 LAB — SARS CORONAVIRUS 2 BY RT PCR (HOSPITAL ORDER, PERFORMED IN ~~LOC~~ HOSPITAL LAB): SARS Coronavirus 2: NEGATIVE

## 2020-03-05 IMAGING — DX DG CHEST 1V PORT
1 series · 2 of 2 positions shown · non-contrast
Comparison: None.

CLINICAL DATA: Cough for 1 week

EXAM:
PORTABLE CHEST 1 VIEW

[Series 1: chest · 0.14mm/px · 2 of 2 slices shown]
[im 1/2]
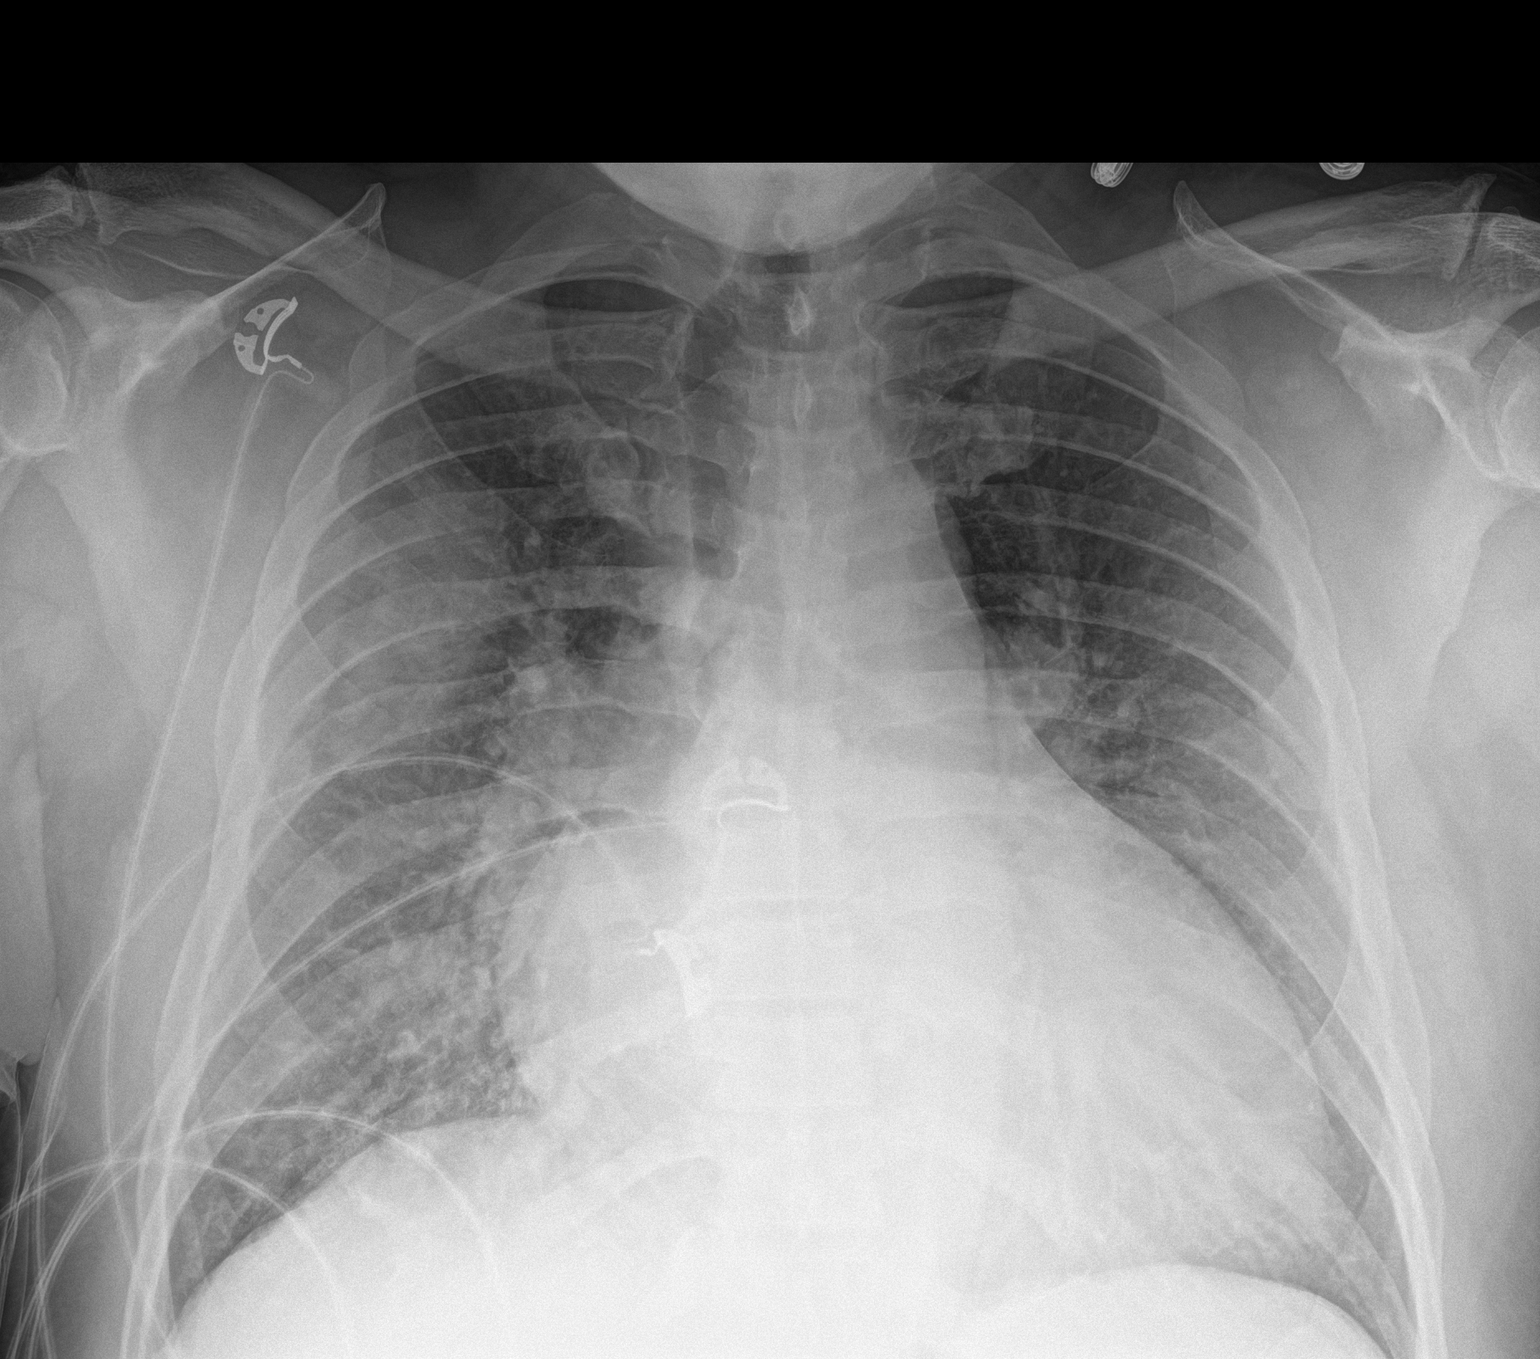
[im 2/2]
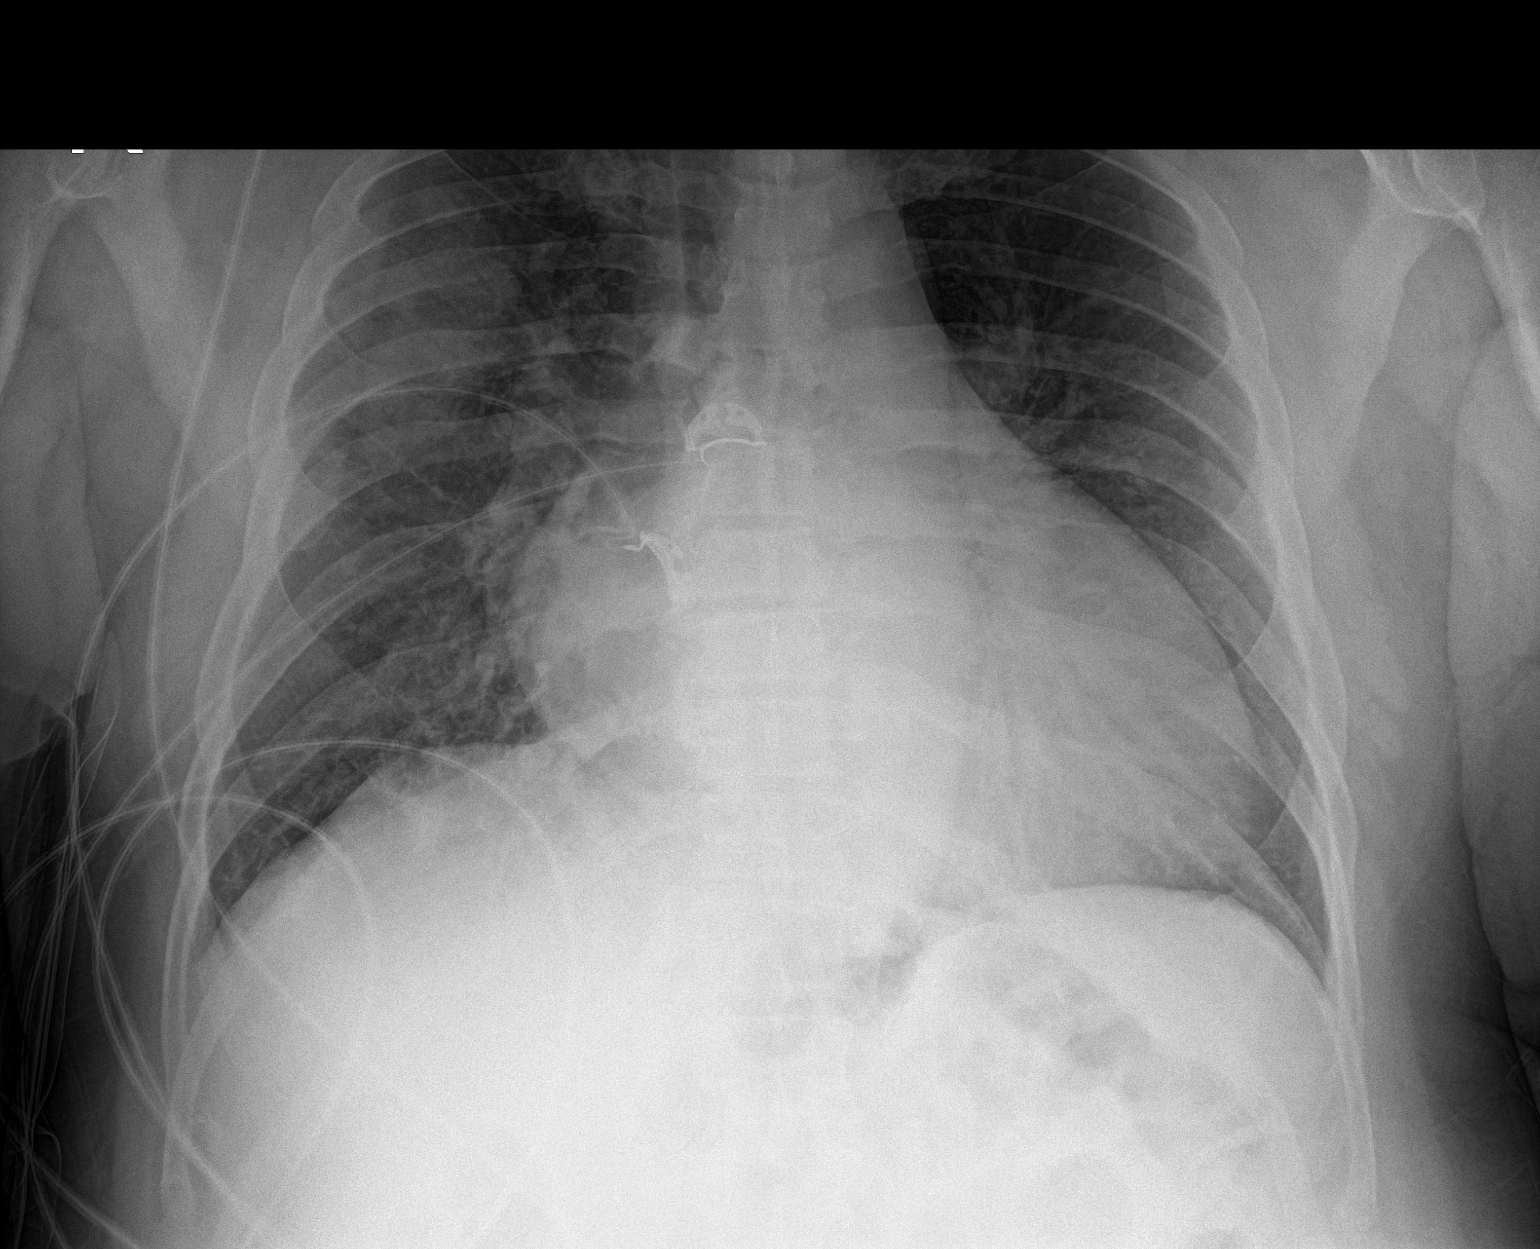

[2 of 2 positions shown; findings below may reference images not displayed]

FINDINGS: Cardiac shadow is enlarged. Patchy opacities are noted throughout
both lungs primarily on the right. No sizable effusion is seen. No
bony abnormality is noted.
IMPRESSION: Patchy opacities bilaterally worse on the right than left consistent
with multifocal pneumonia.

## 2020-03-05 MED ORDER — AMLODIPINE BESYLATE 10 MG PO TABS: 10.0000 mg | ORAL_TABLET | Freq: Every day | ORAL | 0 refills | Status: DC

## 2020-03-05 MED ORDER — HYDROCHLOROTHIAZIDE 12.5 MG PO CAPS: 25.0000 mg | ORAL_CAPSULE | Freq: Every day | ORAL | 0 refills | Status: DC

## 2020-03-05 MED ORDER — AMOXICILLIN 500 MG PO CAPS: 1000.0000 mg | ORAL_CAPSULE | Freq: Two times a day (BID) | ORAL | 0 refills | Status: DC

## 2020-03-05 NOTE — Discharge Instructions (Signed)
Take Amoxil twice daily as directed for pneumonia. Your COVID test results can be obtained through [facility] later today.   As we discussed, leaving your blood pressure untreated will lead to other debilitating disease such as stroke, kidney failure, heart and vascular disease, blindness. Please take the medications as prescribed and follow up with a primary care physician of your choice for ongoing treatment.

## 2020-03-05 NOTE — ED Provider Notes (Signed)
[facility] EMERGENCY DEPARTMENT Provider Note   CSN: [id number] Arrival date & time: 03/04/20  1725     History Chief Complaint  Patient presents with  . Cough    Carlos Hayes is a 59 y.o. male.  Patient to ED for evaluation of cough for the past 2-3 days. Cough is nonproductive. He denies fever, is having no chest pain, SOB, nausea or night sweats. No known COVID exposures. He is COVID vaccinated. No nausea, vomiting, diarrhea.   The history is provided by the patient. No language interpreter was used.  Cough Associated symptoms: no chills and no fever        Past Medical History:  Diagnosis Date  . Gout   . Hypertension     There are no problems to display for this patient.   No past surgical history on file.     No family history on file.  Social History   Tobacco Use  . Smoking status: Never Smoker  . Smokeless tobacco: Never Used  Substance Use Topics  . Alcohol use: Yes  . Drug use: No    Home Medications Prior to Admission medications   Medication Sig Start Date End Date Taking? Authorizing Provider  amLODipine (NORVASC) 10 MG tablet Take 1 tablet (10 mg total) by mouth daily. 07/01/18   Erskine Squibb, MD  aspirin EC 325 MG tablet Take 325 mg by mouth daily.    [provider]  colchicine 0.6 MG tablet Take 1 tablet (0.6 mg total) by mouth daily. 07/01/18 07/31/18  Erskine Squibb, MD  Dextran 70-Hypromellose (ARTIFICIAL TEARS PF OP) Place 2 drops into both eyes 3 (three) times daily as needed (for eye irritation or dryness).    [provider]  hydrochlorothiazide (MICROZIDE) 12.5 MG capsule Take 2 capsules (25 mg total) by mouth daily. 07/01/18   Erskine Squibb, MD    Allergies    Patient has no known allergies.  Review of Systems   Review of Systems  Constitutional: Negative for chills and fever.  HENT: Negative.   Respiratory: Positive for cough.   Cardiovascular: Negative.   Gastrointestinal:  Negative.   Musculoskeletal: Negative.   Skin: Negative.   Neurological: Negative.     Physical Exam Updated Vital Signs BP (!) 215/112 (BP Location: Right Arm)   Pulse 92   Temp 98.3 F (36.8 C) (Oral)   Resp (!) 28   Ht 5\' 7"  (1.702 m)   Wt 77.1 kg   SpO2 99%   BMI 26.63 kg/m   Physical Exam Vitals and nursing note reviewed.  Constitutional:      General: He is not in acute distress.    Appearance: He is well-developed. He is not ill-appearing.  HENT:     Head: Normocephalic.  Cardiovascular:     Rate and Rhythm: Normal rate and regular rhythm.     Heart sounds: No murmur heard.   Pulmonary:     Effort: Pulmonary effort is normal.     Breath sounds: Normal breath sounds. No wheezing, rhonchi or rales.     Comments: No active cough noted throughout exam.  Abdominal:     General: Bowel sounds are normal.     Palpations: Abdomen is soft.     Tenderness: There is no abdominal tenderness. There is no guarding or rebound.  Musculoskeletal:        General: Normal range of motion.     Cervical back: Normal range of motion and neck supple.  Skin:    General: Skin is warm and dry.     Findings: No rash.  Neurological:     Mental Status: He is alert.     Cranial Nerves: No cranial nerve deficit.     ED Results / Procedures / Treatments   Labs (all labs ordered are listed, but only abnormal results are displayed) Labs Reviewed  BASIC METABOLIC PANEL - Abnormal; Notable for the following components:      Result Value   Creatinine, Ser 1.49 (*)    GFR calc non Af Amer 51 (*)    GFR calc Af Amer 60 (*)    All other components within normal limits  CBC - Abnormal; Notable for the following components:   MCH 24.7 (*)    MCHC 29.6 (*)    All other components within normal limits    EKG None  Radiology No results found. DG Chest Portable 1 View  Result Date: 03/05/2020 CLINICAL DATA:  Cough for 1 week EXAM: PORTABLE CHEST 1 VIEW COMPARISON:  None. FINDINGS:  Cardiac shadow is enlarged. Patchy opacities are noted throughout both lungs primarily on the right. No sizable effusion is seen. No bony abnormality is noted. IMPRESSION: Patchy opacities bilaterally worse on the right than left consistent with multifocal pneumonia. Electronically Signed   By: Inez Catalina M.D.   On: 03/05/2020 02:24    Procedures Procedures (including critical care time)  Medications Ordered in ED Medications - No data to display  ED Course  I have reviewed the triage vital signs and the nursing notes.  Pertinent labs & imaging results that were available during my care of the patient were reviewed by me and considered in my medical decision making (see chart for details).    MDM Rules/Calculators/A&P                          Patient to ED for evaluation of cough x 2-3 days. No other ss/sxs of illness.   He is extremely hypertensive on multiple readings. Chart reviewed. He was similarly hypertensive on previous visits. He denies taking medications continuously and his last prescription was that given to him here (2019). Discussed the importance of blood pressure control and the consequences of leaving it untreated.   CXR c/w multi-focal PNA, concerning for COVID-19. He is well appearing, not hypoxic, no fever. He is vaccinated. COVID collected and is pending. Will start on Amoxil 1000 mg bid.   Will provide referral for primary care and Rx's for blood pressure.  Final Clinical Impression(s) / ED Diagnoses Final diagnoses:  None   1. Pneumonia 2. Hypertension 3. Medical noncompliance  Rx / DC Orders ED Discharge Orders    None       Dennie Bible 33/82/50 [id number]    Delora Fuel, MD 67/34/19 [phone]

## 2020-09-26 ENCOUNTER — Other Ambulatory Visit: Payer: Self-pay

## 2020-09-26 ENCOUNTER — Emergency Department (HOSPITAL_COMMUNITY): Payer: 59

## 2020-09-26 ENCOUNTER — Observation Stay (HOSPITAL_COMMUNITY)
Admission: EM | Admit: 2020-09-26 | Discharge: 2020-09-27 | Disposition: A | Discharge status: Home / Self Care | Payer: 59 | Attending: Internal Medicine | Admitting: Internal Medicine

## 2020-09-26 DIAGNOSIS — I10 Essential (primary) hypertension: Secondary | ICD-10-CM

## 2020-09-26 DIAGNOSIS — Z79899 Other long term (current) drug therapy: Secondary | ICD-10-CM | POA: Insufficient documentation

## 2020-09-26 DIAGNOSIS — I472 Ventricular tachycardia: Secondary | ICD-10-CM | POA: Insufficient documentation

## 2020-09-26 DIAGNOSIS — E785 Hyperlipidemia, unspecified: Secondary | ICD-10-CM | POA: Diagnosis not present

## 2020-09-26 DIAGNOSIS — R471 Dysarthria and anarthria: Secondary | ICD-10-CM | POA: Diagnosis not present

## 2020-09-26 DIAGNOSIS — D352 Benign neoplasm of pituitary gland: Secondary | ICD-10-CM | POA: Diagnosis not present

## 2020-09-26 DIAGNOSIS — I5032 Chronic diastolic (congestive) heart failure: Secondary | ICD-10-CM | POA: Diagnosis not present

## 2020-09-26 DIAGNOSIS — R479 Unspecified speech disturbances: Secondary | ICD-10-CM

## 2020-09-26 DIAGNOSIS — R4702 Dysphasia: Secondary | ICD-10-CM

## 2020-09-26 DIAGNOSIS — I160 Hypertensive urgency: Secondary | ICD-10-CM

## 2020-09-26 DIAGNOSIS — Z20822 Contact with and (suspected) exposure to covid-19: Secondary | ICD-10-CM | POA: Diagnosis not present

## 2020-09-26 DIAGNOSIS — N1831 Chronic kidney disease, stage 3a: Secondary | ICD-10-CM | POA: Diagnosis not present

## 2020-09-26 DIAGNOSIS — I130 Hypertensive heart and chronic kidney disease with heart failure and stage 1 through stage 4 chronic kidney disease, or unspecified chronic kidney disease: Secondary | ICD-10-CM | POA: Diagnosis present

## 2020-09-26 DIAGNOSIS — G459 Transient cerebral ischemic attack, unspecified: Secondary | ICD-10-CM | POA: Diagnosis present

## 2020-09-26 LAB — CBC WITH DIFFERENTIAL/PLATELET
Abs Immature Granulocytes: 0.01 10*3/uL (ref 0.00–0.07)
Basophils Absolute: 0.1 10*3/uL (ref 0.0–0.1)
Basophils Relative: 1 %
Eosinophils Absolute: 0.2 10*3/uL (ref 0.0–0.5)
Eosinophils Relative: 4 %
HCT: 49.5 % (ref 39.0–52.0)
Hemoglobin: 15.8 g/dL (ref 13.0–17.0)
Immature Granulocytes: 0 %
Lymphocytes Relative: 31 %
Lymphs Abs: 1.5 10*3/uL (ref 0.7–4.0)
MCH: 25.8 pg — ABNORMAL LOW (ref 26.0–34.0)
MCHC: 31.9 g/dL (ref 30.0–36.0)
MCV: 80.9 fL (ref 80.0–100.0)
Monocytes Absolute: 0.5 10*3/uL (ref 0.1–1.0)
Monocytes Relative: 10 %
Neutro Abs: 2.6 10*3/uL (ref 1.7–7.7)
Neutrophils Relative %: 54 %
Platelets: 239 10*3/uL (ref 150–400)
RBC: 6.12 MIL/uL — ABNORMAL HIGH (ref 4.22–5.81)
RDW: 14.5 % (ref 11.5–15.5)
WBC: 4.8 10*3/uL (ref 4.0–10.5)
nRBC: 0 % (ref 0.0–0.2)

## 2020-09-26 LAB — COMPREHENSIVE METABOLIC PANEL
ALT: 18 U/L (ref 0–44)
AST: 18 U/L (ref 15–41)
Albumin: 3.9 g/dL (ref 3.5–5.0)
Alkaline Phosphatase: 60 U/L (ref 38–126)
Anion gap: 9 (ref 5–15)
BUN: 19 mg/dL (ref 6–20)
CO2: 23 mmol/L (ref 22–32)
Calcium: 9.3 mg/dL (ref 8.9–10.3)
Chloride: 106 mmol/L (ref 98–111)
Creatinine, Ser: 1.42 mg/dL — ABNORMAL HIGH (ref 0.61–1.24)
GFR, Estimated: 58 mL/min — ABNORMAL LOW (ref >60)
Glucose, Bld: 94 mg/dL (ref 70–99)
Potassium: 3.8 mmol/L (ref 3.5–5.1)
Sodium: 138 mmol/L (ref 135–145)
Total Bilirubin: 0.9 mg/dL (ref 0.3–1.2)
Total Protein: 7.7 g/dL (ref 6.5–8.1)

## 2020-09-26 IMAGING — MR MR HEAD W/O CM
9 of 11 series · 32 of 48 positions shown · non-contrast
Comparison: Head CT same day

CLINICAL DATA: Acute presentation with slurred speech. Hand
weakness.

EXAM:
MRI HEAD WITHOUT CONTRAST
TECHNIQUE: Multiplanar, multiecho pulse sequences of the brain and surrounding
structures were obtained without intravenous contrast.

[Series 2: DWI · axial · 3.0mm · 0.94mm/px · z∈[-67,+84]mm · 8 of 104 slices shown (1 of 2)]
[im 1/104]
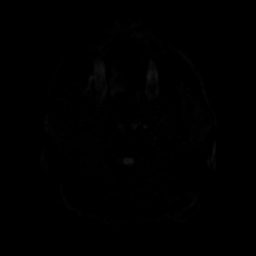
[im 15/104]
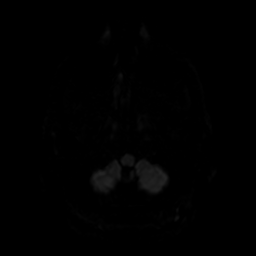
[im 30/104]
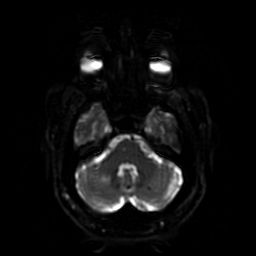
[im 45/104]
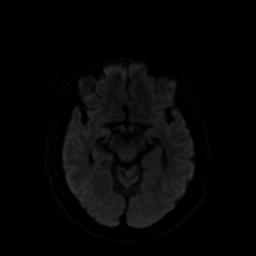
[im 59/104]
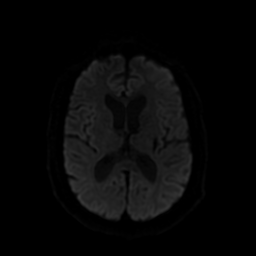
[im 74/104]
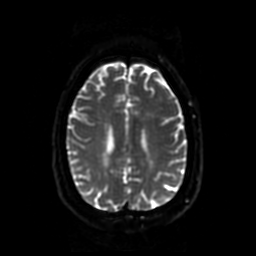
[im 89/104]
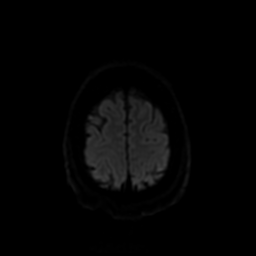
[im 104/104]
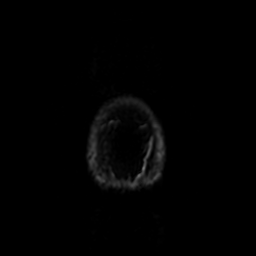

[Series 3: DWI · coronal · 4.0mm · 0.94mm/px · 5 of 72 slices shown (2 of 2)]
[im 1/72]
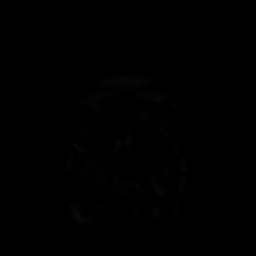
[im 18/72]
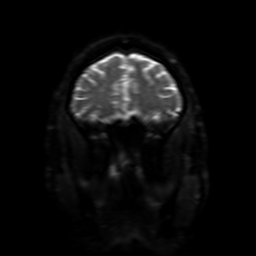
[im 36/72]
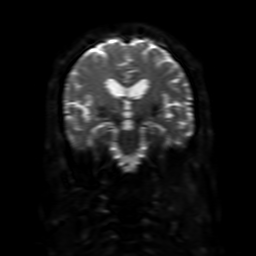
[im 54/72]
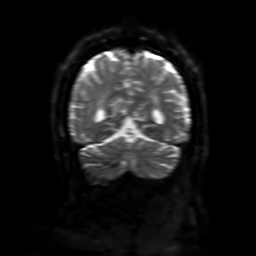
[im 72/72]
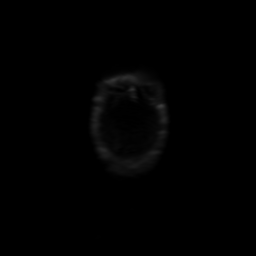

[Series 4: FLAIR · sagittal · 5.0mm · 0.47mm/px · 2 of 25 slices shown (1 of 2)]
[im 1/25]
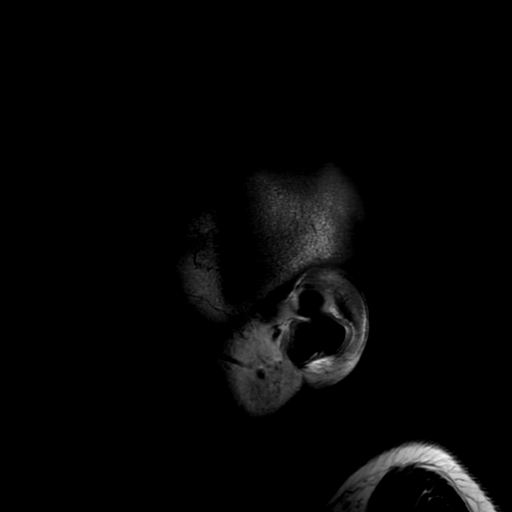
[im 25/25]
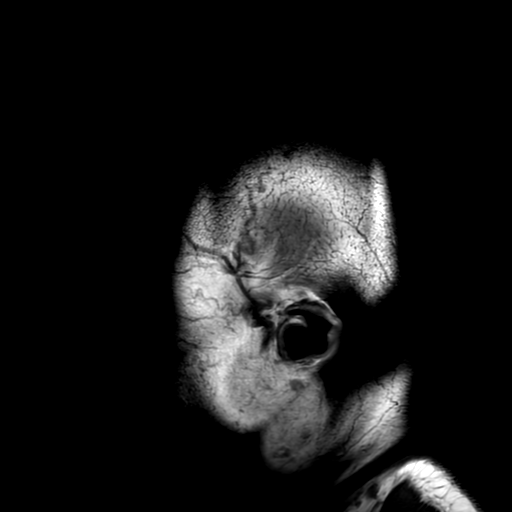

[Series 5: FLAIR · axial · 3.0mm · 0.45mm/px · z∈[-65,+84]mm · 2 of 26 slices shown (2 of 2)]
[im 1/26]
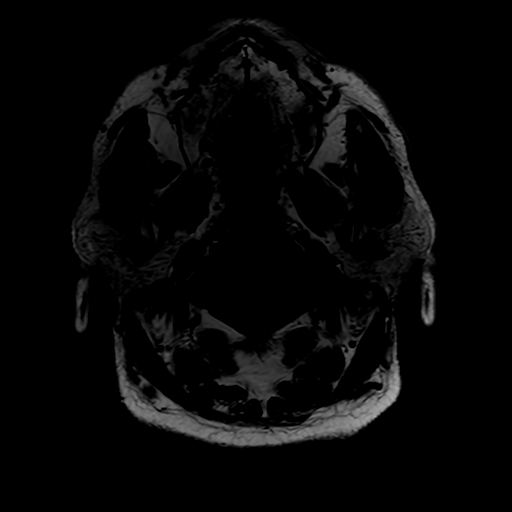
[im 26/26]
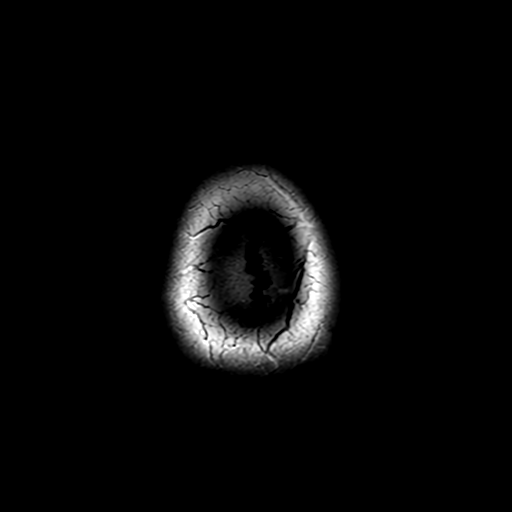

[Series 6: (person_name) · axial · 3.0mm · 0.47mm/px · z∈[-68,-2]mm · 4 of 104 slices shown]
[im 1/104]
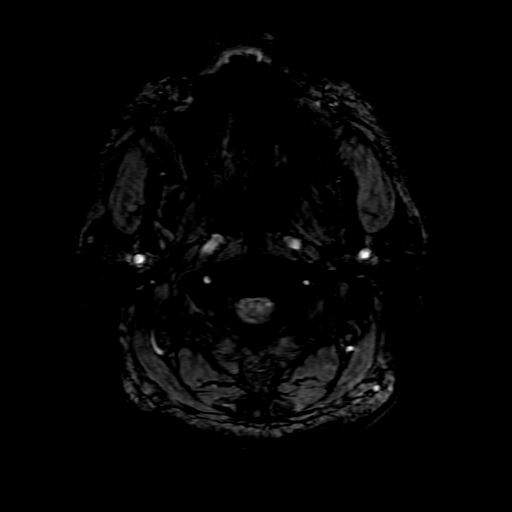
[im 15/104]
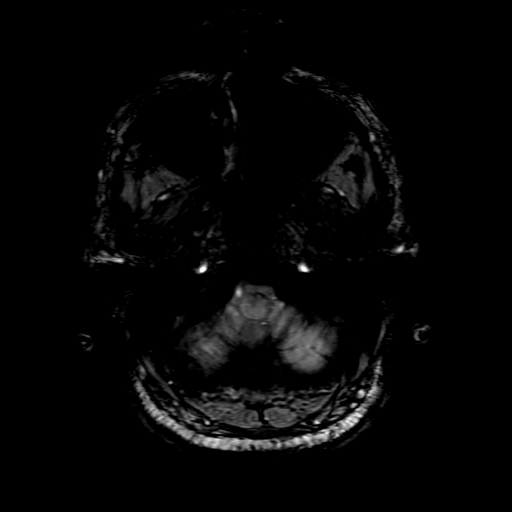
[im 30/104]
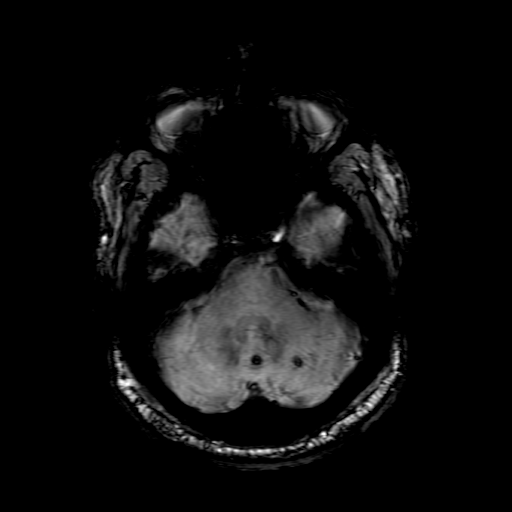
[im 45/104]
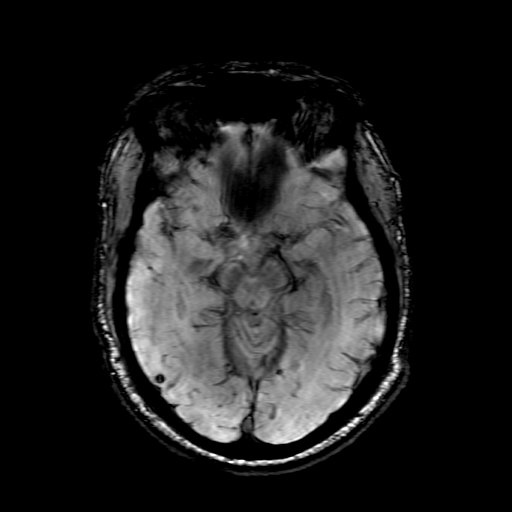

[Series 7: T2 · axial · 5.0mm · 0.47mm/px · z∈[-66,+83]mm · 2 of 26 slices shown (1 of 2)]
[im 1/26]
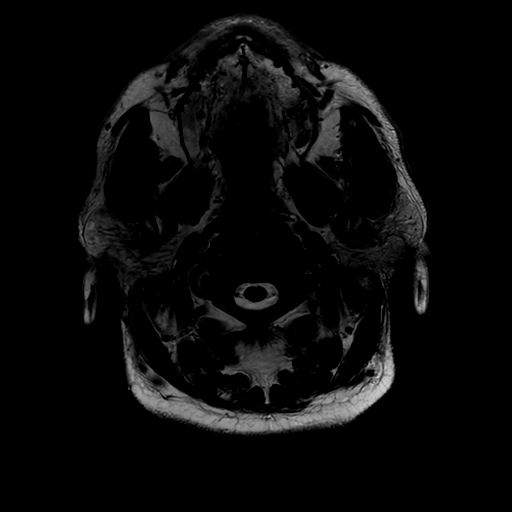
[im 26/26]
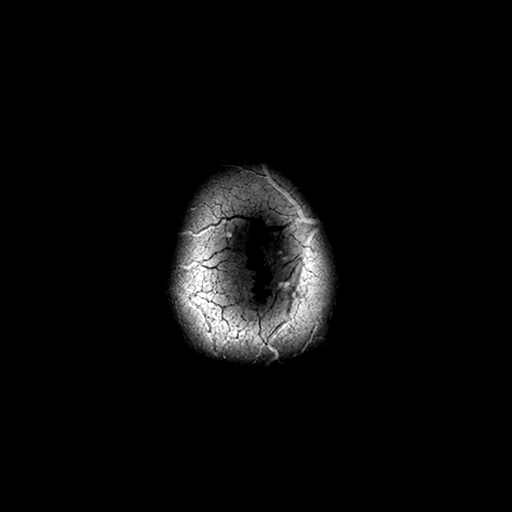

[Series 9: T2 · coronal · 5.0mm · 0.39mm/px · 2 of 30 slices shown (2 of 2)]
[im 1/30]
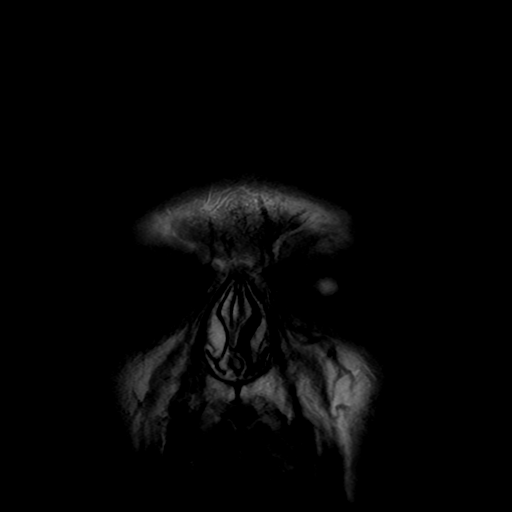
[im 30/30]
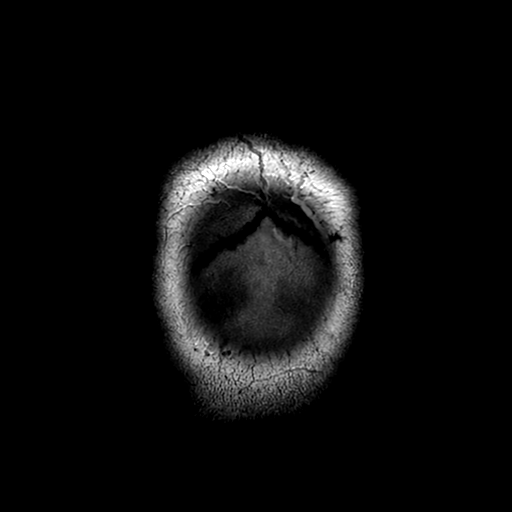

[Series 250: ADC · axial · 3.0mm · 0.94mm/px · z∈[-67,+84]mm · 4 of 52 slices shown (1 of 2)]
[im 1/52]
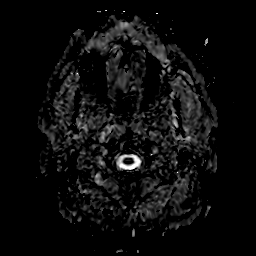
[im 18/52]
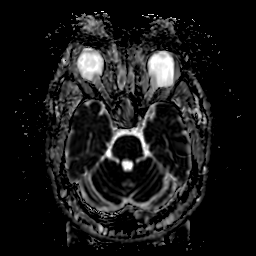
[im 35/52]
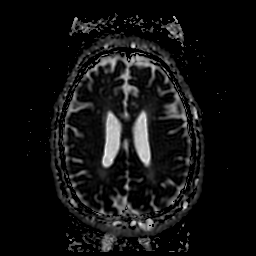
[im 52/52]
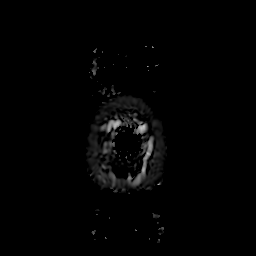

[Series 350: ADC · coronal · 4.0mm · 0.94mm/px · 3 of 36 slices shown (2 of 2)]
[im 1/36]
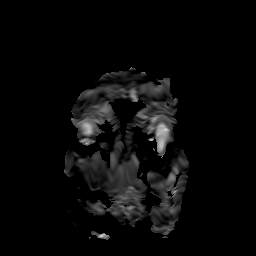
[im 18/36]
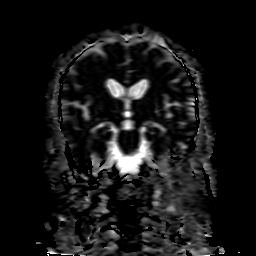
[im 36/36]
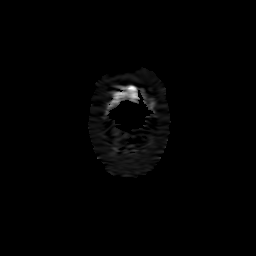

[32 of 48 positions shown; findings below may reference images not displayed]

FINDINGS: Brain: Diffusion imaging does not show any acute in far
shin/restricted diffusion. Pons shows a few old small vessel
infarctions with a single focus of hemosiderin deposition in the
ventral pons. Cerebral hemispheres show numerous foci of hemosiderin
deposition, most appearing old and probably associated with old
small vessel infarctions. Within the right cerebellum, there is a
small focus of hemosiderin deposition with surrounding edema. This
could be a recent hemorrhagic small vessel infarction. Cerebral
hemispheres show moderate chronic small-vessel ischemic change of
the thalami and hemispheric white matter, many associated with
punctate hemosiderin deposition. Old hemorrhagic lacunar infarction
in the external capsule on the left. This type of appearance is
presumed secondary to hypertensive disease. The differential
diagnosis would be an early manifestation of amyloid angiopathy. No
large vessel territory infarction. No mass lesion, hydrocephalus or
extra-axial collection.

Vascular: Major vessels at the base of the brain show flow.

Skull and upper cervical spine: Negative

Sinuses/Orbits: Clear/normal

Other: None
IMPRESSION: 1. Small-vessel ischemic changes affecting the pons, cerebellum,
thalami, basal ganglia and cerebral hemispheric white matter. Many
of the insults are associated with hemosiderin deposition,
suggesting hypertensive small-vessel disease as the etiology. Within
the right cerebellum, there is a focus of edema associated with a
microhemorrhage. This probably represents a recent hemorrhagic small
vessel infarction. No other identifiable acute intracranial finding.

## 2020-09-26 IMAGING — CT CT HEAD W/O CM
4 series · 16 of 47 positions shown, 18 images · non-contrast
Comparison: None.

CLINICAL DATA: TIA

EXAM:
CT HEAD WITHOUT CONTRAST
TECHNIQUE: Contiguous axial images were obtained from the base of the skull
through the vertex without intravenous contrast.

[Series 3: head without · axial · non-contrast · 0.44mm/px · z∈[+1167,+1287]mm · 7 of 33 slices shown, 9 images]
[im 5/33  brain]
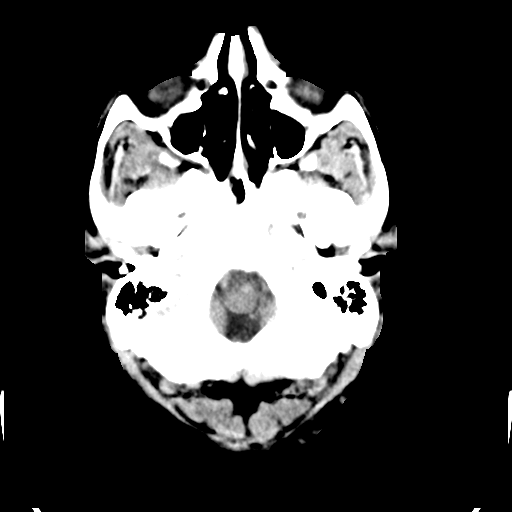
[im 5/33  bone]
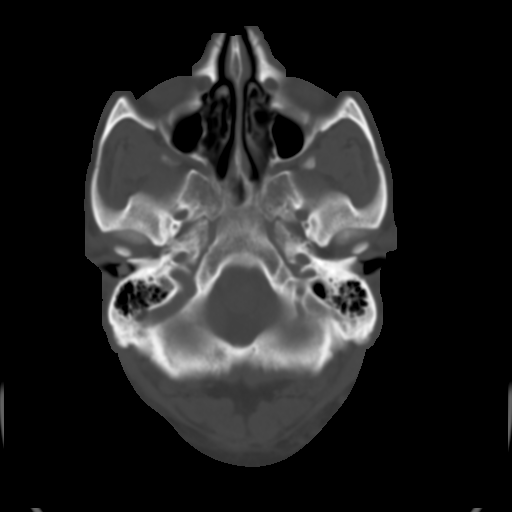
[im 9/33  brain]
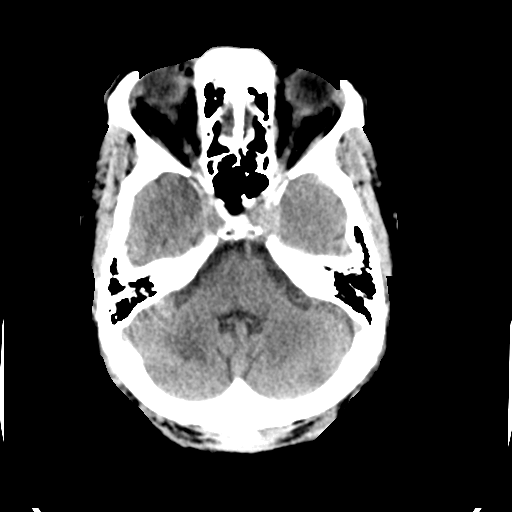
[im 13/33  brain]
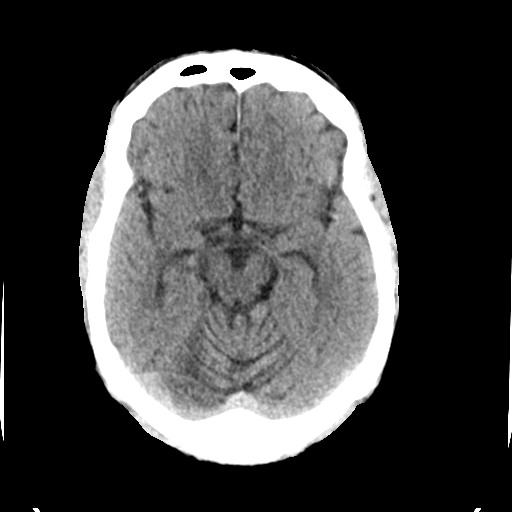
[im 17/33  brain]
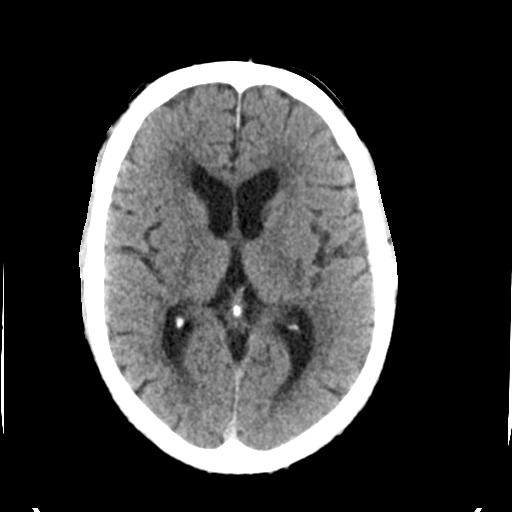
[im 21/33  brain]
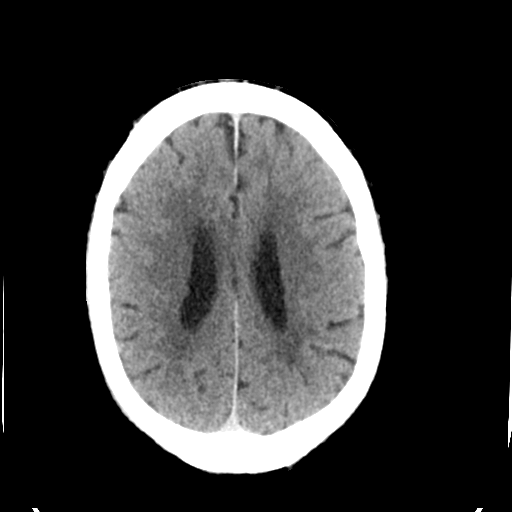
[im 21/33  bone]
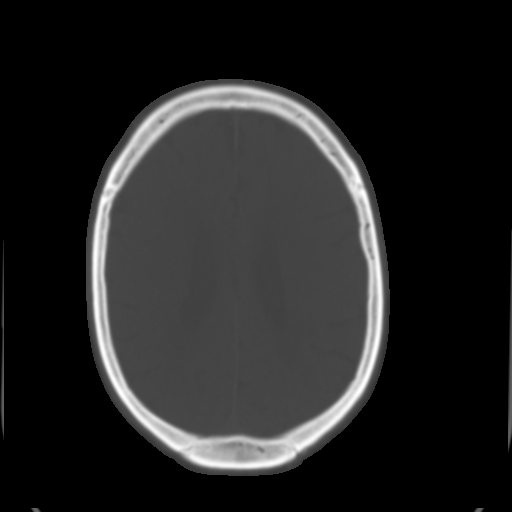
[im 25/33  brain]
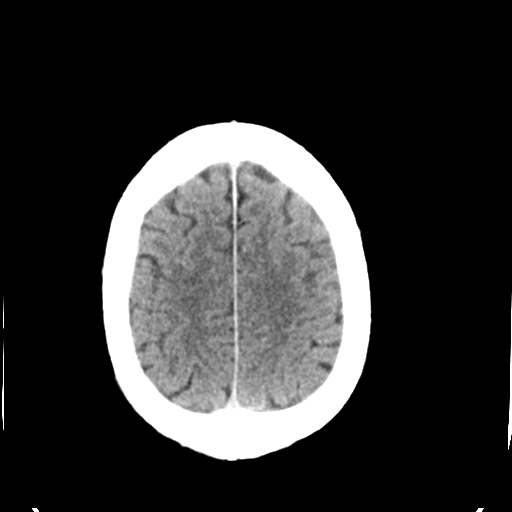
[im 29/33  brain]
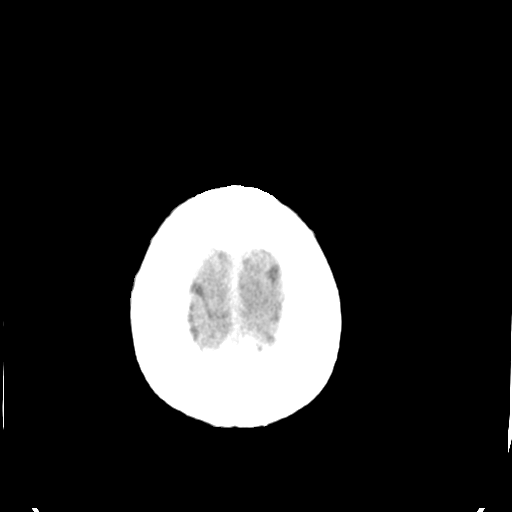

[Series 4: head bone · axial · 0.44mm/px · z∈[+1163,+1195]mm · 3 of 83 slices shown]
[im 9/83  bone]
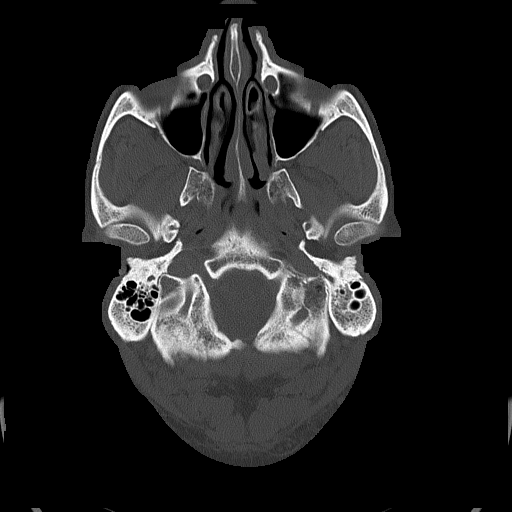
[im 17/83  bone]
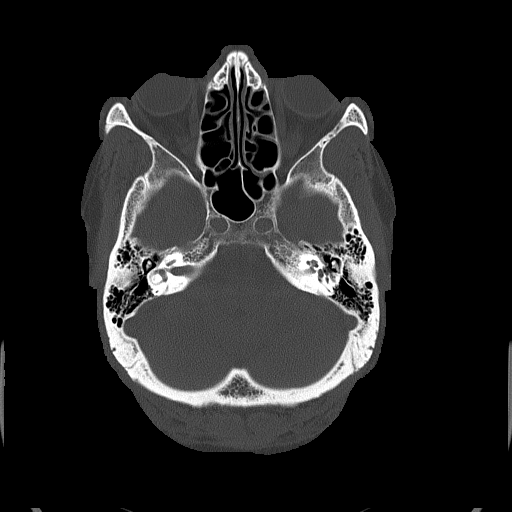
[im 25/83  bone]
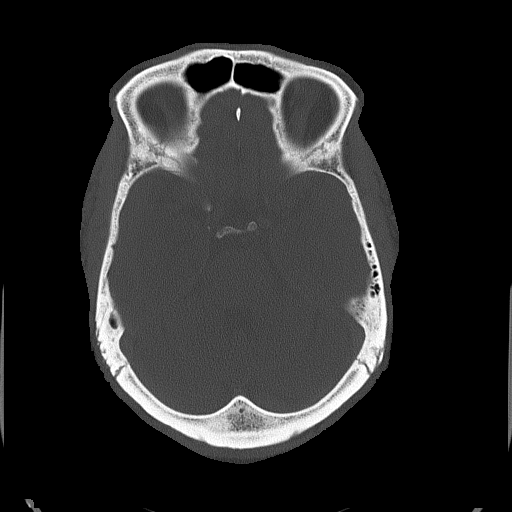

[Series 5: head without cor · coronal · non-contrast · 0.33mm/px · 3 of 70 slices shown]
[im 24/70  brain]
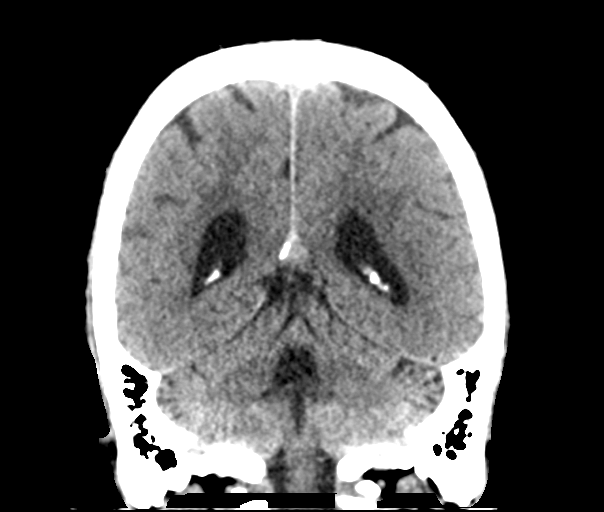
[im 31/70  brain]
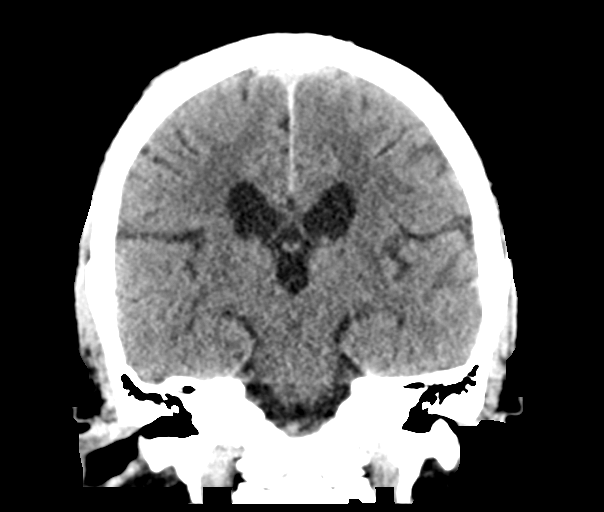
[im 39/70  brain]
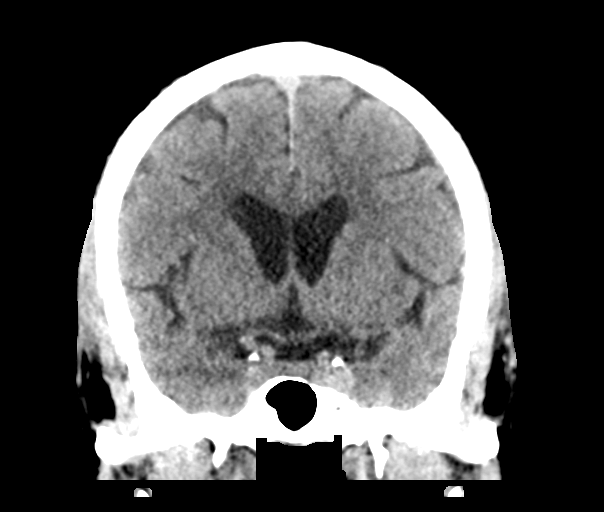

[Series 6: head without sag · sagittal · non-contrast · 0.33mm/px · 3 of 66 slices shown]
[im 22/66  brain]
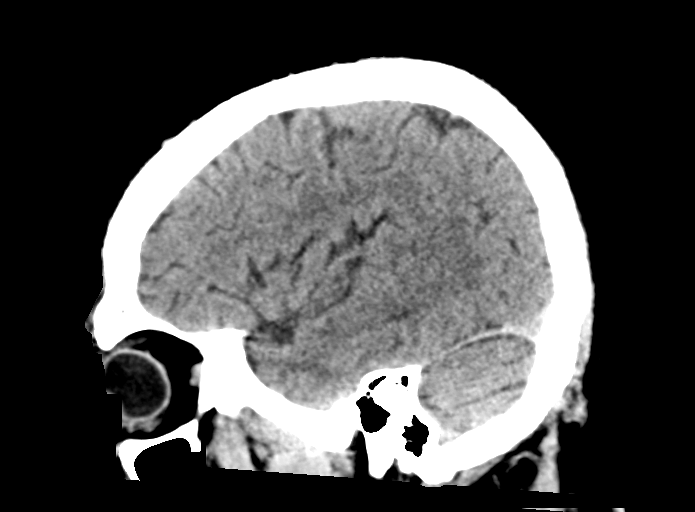
[im 33/66  brain]
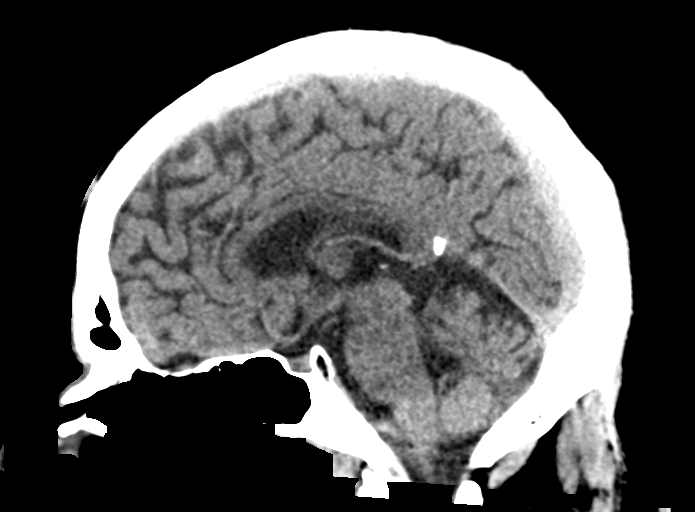
[im 44/66  brain]
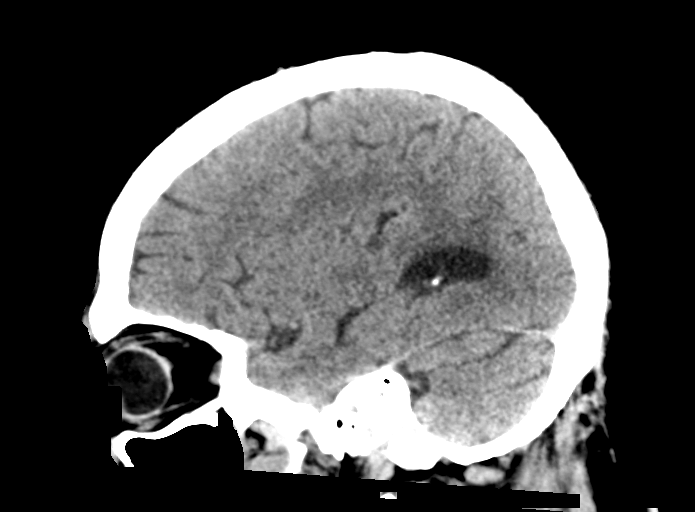

[16 of 47 positions shown; findings below may reference images not displayed]

FINDINGS: Brain: No evidence of acute infarction, hemorrhage, hydrocephalus,
extra-axial collection or mass lesion/mass effect. Mild
periventricular white matter hypodensities consistent with sequela
of chronic microvascular ischemic disease.

Vascular: No hyperdense vessel or unexpected calcification.

Skull: Normal. Negative for fracture or focal lesion.

Sinuses/Orbits: No acute finding.

Other: Bilateral ear cerumen.
IMPRESSION: No acute intracranial abnormality.

## 2020-09-26 MED ORDER — ACETAMINOPHEN 650 MG RE SUPP
650.0000 mg | RECTAL | Status: DC | PRN
Start: 2020-09-26 — End: 2020-09-27

## 2020-09-26 MED ORDER — ACETAMINOPHEN 325 MG PO TABS: 650.0000 mg | ORAL_TABLET | ORAL | Status: DC | PRN

## 2020-09-26 MED ORDER — HYDROCHLOROTHIAZIDE 12.5 MG PO CAPS
25.0000 mg | ORAL_CAPSULE | Freq: Every day | ORAL | Status: DC
  Administered 2020-09-27: 25 mg via ORAL
  Filled 2020-09-26: qty 2

## 2020-09-26 MED ORDER — SENNOSIDES-DOCUSATE SODIUM 8.6-50 MG PO TABS: 1.0000 | ORAL_TABLET | Freq: Every evening | ORAL | Status: DC | PRN

## 2020-09-26 MED ORDER — ACETAMINOPHEN 160 MG/5ML PO SOLN: 650.0000 mg | ORAL | Status: DC | PRN

## 2020-09-26 MED ORDER — SODIUM CHLORIDE 0.9 % IV SOLN: INTRAVENOUS | Status: DC

## 2020-09-26 MED ORDER — ASPIRIN 300 MG RE SUPP: 300.0000 mg | Freq: Every day | RECTAL | Status: DC

## 2020-09-26 MED ORDER — AMLODIPINE BESYLATE 5 MG PO TABS
5.0000 mg | ORAL_TABLET | Freq: Once | ORAL | Status: AC
  Administered 2020-09-26: 5 mg via ORAL
  Filled 2020-09-26: qty 1

## 2020-09-26 MED ORDER — STROKE: EARLY STAGES OF RECOVERY BOOK
Freq: Once | Status: AC
  Filled 2020-09-26: qty 1

## 2020-09-26 MED ORDER — AMLODIPINE BESYLATE 10 MG PO TABS
10.0000 mg | ORAL_TABLET | Freq: Every day | ORAL | Status: DC
  Administered 2020-09-27: 10 mg via ORAL
  Filled 2020-09-26: qty 1

## 2020-09-26 MED ORDER — ENOXAPARIN SODIUM 40 MG/0.4ML ~~LOC~~ SOLN: 40.0000 mg | Freq: Every day | SUBCUTANEOUS | Status: DC

## 2020-09-26 MED ORDER — ASPIRIN 325 MG PO TABS: 325.0000 mg | ORAL_TABLET | Freq: Every day | ORAL | Status: DC

## 2020-09-26 MED ORDER — LABETALOL HCL 5 MG/ML IV SOLN
10.0000 mg | Freq: Once | INTRAVENOUS | Status: AC
  Administered 2020-09-26: 10 mg via INTRAVENOUS
  Filled 2020-09-26: qty 4

## 2020-09-26 MED ORDER — ASPIRIN 81 MG PO CHEW
81.0000 mg | CHEWABLE_TABLET | Freq: Once | ORAL | Status: AC
  Administered 2020-09-26: 81 mg via ORAL
  Filled 2020-09-26: qty 1

## 2020-09-26 NOTE — ED Provider Notes (Signed)
[facility] EMERGENCY DEPARTMENT Provider Note   CSN: [id number] Arrival date & time: 09/26/20  1334     History Chief Complaint  Patient presents with  . Hypertension    Carlos Hayes is a 60 y.o. male presenting for evaluation of speech difficulty .   History provided by both patient and his girlfriend.  She states she looked over at him this morning and he "did not look right."  He then had difficulty picking up his phone which she had dropped, and had difficulty getting his words out.  She describes it as being slurred.  Symptoms lasted for 10 to 15 minutes before resolving without intervention.  He had no one-sided upper or lower weakness.  He has been feeling well since.  He denies recent fevers, chills, chest pain, shortness of breath, dizziness, lightheadedness, cough, nausea, vomiting, abdominal pain, urinary symptoms, abnormal bowel movements.  HPI     Past Medical History:  Diagnosis Date  . Gout   . Hypertension     There are no problems to display for this patient.   No past surgical history on file.     No family history on file.  Social History   Tobacco Use  . Smoking status: Never Smoker  . Smokeless tobacco: Never Used  Substance Use Topics  . Alcohol use: Yes  . Drug use: No    Home Medications Prior to Admission medications   Medication Sig Start Date End Date Taking? Authorizing Provider  amLODipine (NORVASC) 10 MG tablet Take 1 tablet (10 mg total) by mouth daily. 03/05/20   Charlann Lange, PA-C  amoxicillin (AMOXIL) 500 MG capsule Take 2 capsules (1,000 mg total) by mouth 2 (two) times daily. 03/05/20   Charlann Lange, PA-C  colchicine 0.6 MG tablet Take 1 tablet (0.6 mg total) by mouth daily. Patient not taking: Reported on 03/05/2020 07/01/18 08/05/29  Erskine Squibb, MD  hydrochlorothiazide (MICROZIDE) 12.5 MG capsule Take 2 capsules (25 mg total) by mouth daily. 03/05/20   Charlann Lange, PA-C    Allergies     Patient has no known allergies.  Review of Systems   Review of Systems  Physical Exam Updated Vital Signs BP (!) 208/116 (BP Location: Right Arm)   Pulse 87   Temp 98.9 F (37.2 C) (Oral)   Resp 20   SpO2 95%   Physical Exam  ED Results / Procedures / Treatments   Labs (all labs ordered are listed, but only abnormal results are displayed) Labs Reviewed  CBC WITH DIFFERENTIAL/PLATELET  COMPREHENSIVE METABOLIC PANEL  URINALYSIS, ROUTINE W REFLEX MICROSCOPIC    EKG EKG Interpretation  Date/Time:  Friday September 26 2020 14:30:03 EST Ventricular Rate:  82 PR Interval:  216 QRS Duration: 88 QT Interval:  388 QTC Calculation: 453 R Axis:   27 Text Interpretation: Sinus rhythm with 1st degree A-V block Possible Left atrial enlargement Minimal voltage criteria for LVH, may be normal variant ( [employer] product ) Anteroseptal infarct , age undetermined Abnormal ECG No significant change since last tracing -similar STE lateral leads Confirmed by Gareth Morgan [id number]) on 09/26/2020 2:38:55 PM   Radiology No results found.  Procedures Procedures   Medications Ordered in ED Medications - No data to display  ED Course  I have reviewed the triage vital signs and the nursing notes.  Pertinent labs & imaging results that were available during my care of the patient were reviewed by me and considered in my medical decision making (see chart for  details).    MDM Rules/Calculators/A&P                          Patient presenting for evaluation of speech difficulty and high blood pressure.  On exam, patient appears nontoxic.  He is neuro intact.  He continues to be mildly hypertensive, though per chart review, this is baseline.  Consider TIA, as patient had symptoms that have since resolved.  Consider stroke.  Consider hypertensive emergency, although less likely as symptoms have resolved and blood pressure remains elevated.  Will obtain labs, urine, CT head.  If reassuring, pt  will likely need MRI. I would discuss with neuro to see if they recommend admission regardless of MRI results for TIA vs cva workup  Patient signed out to C. Donney Rankins, PA-C for follow-up on labs and CT head along with neuro recommendations.   Final Clinical Impression(s) / ED Diagnoses Final diagnoses:  None    Rx / DC Orders ED Discharge Orders    None       Franchot Heidelberg, PA-C 09/26/20 1518    Gareth Morgan, MD 09/26/20 2258

## 2020-09-26 NOTE — ED Notes (Signed)
The pt reports that he has not had bp meds for 1-2 years    He had a regular doctor that dismissed the pt because he kept missing appointments

## 2020-09-26 NOTE — H&P (Signed)
[place]   PATIENT NAME: Carlos Hayes    MR#:  [id number]  DATE OF BIRTH:  22-Dec-1960  DATE OF ADMISSION:  09/26/2020  PRIMARY CARE PHYSICIAN: Patient, No Pcp Per   Patient is coming from: Home  REQUESTING/REFERRING PHYSICIAN: Kinnie Feil, PA-C S/ CHIEF COMPLAINT:   Chief Complaint  Patient presents with  . Hypertension    HISTORY OF PRESENT ILLNESS:  Carlos Hayes is a 60 y.o. male with medical history significant for essential hypertension and gout, presented to the emergency room with acute onset of transient slurred speech with expressive dysphasia without paresthesias or focal muscle weakness or facial droop or dysphagia.  No chest pain or palpitations.  No tinnitus or vertigo.  No headache or dizziness or blurred vision.  No fever or chills.  No nausea or vomiting or abdominal pain.  No urinary or stool incontinence.  No dysuria, oliguria or hematuria or flank pain.  The patient takes aspirin on a daily basis. ED Course: Initial blood pressure was 208 /116 with otherwise normal vital signs.  It has been as high as 219/131.  Labs reveals a creatinine 1.42 compared to 1.49 on 03/04/2020.  UA came back negative EKG as reviewed by me :Showed sinus rhythm with a first-degree AV block with a rate of 88 with Q waves anteroseptally voltage criteria for LVH with possible left atrial enlargement. Imaging: Noncontrasted head CT scan revealed no acute intracranial abnormalities.  The patient was given 10 mg of IV labetalol, 5 mg of p.o. Norvasc and 81 mg of p.o. aspirin. PAST MEDICAL HISTORY:   Past Medical History:  Diagnosis Date  . Gout   . Hypertension     PAST SURGICAL HISTORY:  No past surgical history on file.  He denies any previous surgeries.  SOCIAL HISTORY:   Social History   Tobacco Use  . Smoking status: Never Smoker  . Smokeless tobacco: Never Used  Substance Use Topics  . Alcohol use: Yes    FAMILY HISTORY:  No family history on  file.  No pertinent familial disease.  DRUG ALLERGIES:  No Known Allergies  REVIEW OF SYSTEMS:   ROS As per history of present illness. All pertinent systems were reviewed above. Constitutional, HEENT, cardiovascular, respiratory, GI, GU, musculoskeletal, neuro, psychiatric, endocrine, integumentary and hematologic systems were reviewed and are otherwise negative/unremarkable except for positive findings mentioned above in the HPI.   MEDICATIONS AT HOME:   Prior to Admission medications   Medication Sig Start Date End Date Taking? Authorizing Provider  amLODipine (NORVASC) 10 MG tablet Take 1 tablet (10 mg total) by mouth daily. 03/05/20  Yes Upstill, Shari, PA-C  APPLE CIDER VINEGAR PO Take 1 capsule by mouth daily.   Yes [provider]  colchicine 0.6 MG tablet Take 1 tablet (0.6 mg total) by mouth daily. 07/01/18 08/05/29 Yes Erskine Squibb, MD  hydrochlorothiazide (MICROZIDE) 12.5 MG capsule Take 2 capsules (25 mg total) by mouth daily. 03/05/20  Yes Upstill, Nehemiah Settle, PA-C  naproxen sodium (ALEVE) 220 MG tablet Take 440 mg by mouth daily as needed (headache/pain).   Yes [provider]  thiamine (VITAMIN B-1) 50 MG tablet Take 50 mg by mouth daily.   Yes [provider]      VITAL SIGNS:  Blood pressure (!) 217/121, pulse 82, temperature 98.9 F (37.2 C), resp. rate 18, SpO2 98 %.  PHYSICAL EXAMINATION:  Physical Exam  GENERAL:  60 y.o.-year-old African-American patient lying in the bed with no  acute distress.  EYES: Pupils equal, round, reactive to light and accommodation. No scleral icterus. Extraocular muscles intact.  HEENT: Head atraumatic, normocephalic. Oropharynx and nasopharynx clear.  NECK:  Supple, no jugular venous distention. No thyroid enlargement, no tenderness.  LUNGS: Normal breath sounds bilaterally, no wheezing, rales,rhonchi or crepitation. No use of accessory muscles of respiration.  CARDIOVASCULAR: Regular rate and rhythm, S1, S2  normal. No murmurs, rubs, or gallops.  ABDOMEN: Soft, nondistended, nontender. Bowel sounds present. No organomegaly or mass.  EXTREMITIES: No pedal edema, cyanosis, or clubbing.  NEUROLOGIC: Cranial nerves II through XII are intact. Muscle strength 5/5 in all extremities. Sensation intact. Gait not checked.  PSYCHIATRIC: The patient is alert and oriented x 3.  Normal affect and good eye contact. SKIN: No obvious rash, lesion, or ulcer.   LABORATORY PANEL:   CBC Recent Labs  Lab 09/26/20 1410  WBC 4.8  HGB 15.8  HCT 49.5  PLT 239   ------------------------------------------------------------------------------------------------------------------  Chemistries  Recent Labs  Lab 09/26/20 1410  NA 138  K 3.8  CL 106  CO2 23  GLUCOSE 94  BUN 19  CREATININE 1.42*  CALCIUM 9.3  AST 18  ALT 18  ALKPHOS 60  BILITOT 0.9   ------------------------------------------------------------------------------------------------------------------  Cardiac Enzymes No results for input(s): TROPONINI in the last 168 hours. ------------------------------------------------------------------------------------------------------------------  RADIOLOGY:  CT Head Wo Contrast  Result Date: 09/26/2020 CLINICAL DATA:  TIA EXAM: CT HEAD WITHOUT CONTRAST TECHNIQUE: Contiguous axial images were obtained from the base of the skull through the vertex without intravenous contrast. COMPARISON:  None. FINDINGS: Brain: No evidence of acute infarction, hemorrhage, hydrocephalus, extra-axial collection or mass lesion/mass effect. Mild periventricular white matter hypodensities consistent with sequela of chronic microvascular ischemic disease. Vascular: No hyperdense vessel or unexpected calcification. Skull: Normal. Negative for fracture or focal lesion. Sinuses/Orbits: No acute finding. Other: Bilateral ear cerumen. IMPRESSION: No acute intracranial abnormality. Electronically Signed   By: Valentino Saxon MD    On: 09/26/2020 15:33   MR BRAIN WO CONTRAST  Result Date: 09/26/2020 CLINICAL DATA:  Acute presentation with slurred speech. Hand weakness. EXAM: MRI HEAD WITHOUT CONTRAST TECHNIQUE: Multiplanar, multiecho pulse sequences of the brain and surrounding structures were obtained without intravenous contrast. COMPARISON:  Head CT same day FINDINGS: Brain: Diffusion imaging does not show any acute in far shin/restricted diffusion. Molli [place] shows a few old small vessel infarctions with a single focus of hemosiderin deposition in the ventral pons. Cerebral hemispheres show numerous foci of hemosiderin deposition, most appearing old and probably associated with old small vessel infarctions. Within the right cerebellum, there is a small focus of hemosiderin deposition with surrounding edema. This could be a recent hemorrhagic small vessel infarction. Cerebral hemispheres show moderate chronic small-vessel ischemic change of the thalami and hemispheric white matter, many associated with punctate hemosiderin deposition. Old hemorrhagic lacunar infarction in the external capsule on the left. This type of appearance is presumed secondary to hypertensive disease. The differential diagnosis would be an early manifestation of amyloid angiopathy. No large vessel territory infarction. No mass lesion, hydrocephalus or extra-axial collection. Vascular: Major vessels at the base of the brain show flow. Skull and upper cervical spine: Negative Sinuses/Orbits: Clear/normal Other: None IMPRESSION: 1. Small-vessel ischemic changes affecting the pons, cerebellum, thalami, basal ganglia and cerebral hemispheric white matter. 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 Within the right cerebellum, there is a focus of edema associated with a microhemorrhage. This probably represents a recent hemorrhagic  small vessel infarction. No other identifiable acute  intracranial finding. Electronically Signed   By: Nelson Chimes M.D.   On: 09/26/2020 21:10      IMPRESSION AND PLAN:  Active Problems:   TIA (transient ischemic attack)  1.  Transient ischemic attack with dysarthria and expressive dysphasia. -The patient will be admitted to an observation medical monitored bed. -We will follow neuro checks every 4 hours for 24 hours. -We will add Plavix to his aspirin. -We will obtain a brain MRI without contrast as well as bilateral carotid Doppler and 2D echo with bubble study. -We will obtain a neurology consultation by Dr. Leonel Ramsay who was notified about the patient and is aware. -PT/OT and ST consults will be obtained.  2.  Hypertensive urgency. -The patient will be placed on as needed IV labetalol with parameters parameters. -We will resume his antihypertensives.  3.  Stage IIIa chronic kidney disease. -The patient will be gently hydrated with IV normal saline and will follow BMP.  4.  Gout. -We will continue colchicine.  DVT prophylaxis: Lovenox. Code Status: full code. Family Communication:  The plan of care was discussed in details with the patient (and family). I answered all questions. The patient agreed to proceed with the above mentioned plan. Further management will depend upon hospital course. Disposition Plan: Back to previous home environment Consults called: Neurology consult to Dr. Leonel Ramsay, PT/OT and ST consults. All the records are reviewed and case discussed with ED provider.  Status is: Observation  The patient remains OBS appropriate and will d/c before 2 midnights.  Dispo: The patient is from: Home              Anticipated d/c is to: Home              Anticipated d/c date is: 1 day              Patient currently is not medically stable to d/c.   Difficult to place patient No    TOTAL TIME TAKING CARE OF THIS PATIENT: 55 minutes.    Christel Mormon M.D on 09/26/2020 at 11:19 PM  [facility]   From  7 PM-7 AM, contact night-coverage [email or web address]  CC: Primary care physician; Patient, No Pcp Per

## 2020-09-26 NOTE — ED Notes (Signed)
bp med given iv

## 2020-09-26 NOTE — ED Triage Notes (Signed)
Pt BIB [facility] from home. Wife called EMS for a stroke stating pt could not walk or talk. Upon EMS arrival pt was able to talk and walk, stroke screen was negative but pt stated he just felt funny when standing up. Pt denies any pain. Pt states he has HTN but has not taken medications in over a year.

## 2020-09-26 NOTE — ED Notes (Signed)
The pt returned from mri 

## 2020-09-26 NOTE — ED Notes (Signed)
Dr Leonel Ramsay a the pts bedside

## 2020-09-26 NOTE — ED Provider Notes (Signed)
1530: care assumed from previous EDPA at shift change. See previous notes for full H&P  Patient with slurred speech at around 7-8 am this morning, lasting 10-15 min witnessed by girlfriend. Asymptomatic on arrival. History of HLD and HTN, non compliant with medicines in the last 1 year.    At shift change pending CT head.  Plan is to fu on CT and consult neurology for disposition admit for TIA work up vs discharge. Will need antihypertensive medicine if discharged, amlodipine.   Physical Exam  BP (!) 217/121 (BP Location: Right Arm)   Pulse 82   Temp 98.9 F (37.2 C)   Resp 18   SpO2 98%   Physical Exam Constitutional:      Appearance: He is well-developed.  HENT:     Head: Normocephalic.     Nose: Nose normal.  Eyes:     General: Lids are normal.  Cardiovascular:     Rate and Rhythm: Normal rate.  Pulmonary:     Effort: Pulmonary effort is normal. No respiratory distress.  Musculoskeletal:        General: Normal range of motion.     Cervical back: Normal range of motion.  Neurological:     Mental Status: He is alert.     Comments: Awake. Speech clear.   Psychiatric:        Behavior: Behavior normal.     ED Course/Procedures   Clinical Course as of 09/26/20 2302  Fri Sep 26, 2020  1647 Spoke to Dr Charlsie Merles neurology recommends MRI brain wo. If negative dc home with aspirin and antihypertensive. Admit if +MRI [CG]  2022 Called MRI regarding delay - patient supposedly en route  [CG]  2300 MR BRAIN WO CONTRAST IMPRESSION: 1. Small-vessel ischemic changes affecting the pons, cerebellum, thalami, basal ganglia and cerebral hemispheric white matter. 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 Within the right cerebellum, there is a focus of edema associated with a microhemorrhage. This probably represents a recent hemorrhagic small vessel infarction. No other identifiable acute intracranial finding.  [CG]    Clinical Course User Index [CG] Kinnie Feil, PA-C    .Critical Care Performed by: Kinnie Feil, PA-C Authorized by: Kinnie Feil, PA-C   Critical care provider statement:    Critical care time (minutes):  45   Critical care was necessary to treat or prevent imminent or life-threatening deterioration of the following conditions:  CNS failure or compromise (CVA/TIA)   Critical care was time spent personally by me on the following activities:  Discussions with consultants, evaluation of patient's response to treatment, examination of patient, ordering and performing treatments and interventions, ordering and review of laboratory studies, ordering and review of radiographic studies, pulse oximetry, re-evaluation of patient's condition, obtaining history from patient or surrogate, review of old charts and development of treatment plan with patient or surrogate   I assumed direction of critical care for this patient from another provider in my specialty: no      MDM   1535: Met patient. Explained pending CT head and neurology consult. Disposition at this time unclear.   2300: MRI showing hypertensive small vessel ischemic changes with edema in right cerebellum.  Spoke to Dr Leonel Ramsay who will see patient in ER. Recommends admission for TIA work up. Spoke to [facility] who will admit patient. Updated patient on plan to admit. Remains asymptomatic. Hypertensive, labetalol given.       Kinnie Feil, PA-C  09/26/20 2302    Quintella Reichert, MD 09/27/20 [phone]

## 2020-09-27 ENCOUNTER — Encounter (HOSPITAL_COMMUNITY): Payer: Self-pay | Admitting: Family Medicine

## 2020-09-27 ENCOUNTER — Observation Stay (HOSPITAL_BASED_OUTPATIENT_CLINIC_OR_DEPARTMENT_OTHER): Payer: 59

## 2020-09-27 ENCOUNTER — Ambulatory Visit (HOSPITAL_COMMUNITY): Payer: 59

## 2020-09-27 ENCOUNTER — Observation Stay (HOSPITAL_COMMUNITY): Payer: 59

## 2020-09-27 DIAGNOSIS — G459 Transient cerebral ischemic attack, unspecified: Secondary | ICD-10-CM

## 2020-09-27 DIAGNOSIS — R479 Unspecified speech disturbances: Secondary | ICD-10-CM | POA: Diagnosis not present

## 2020-09-27 DIAGNOSIS — I10 Essential (primary) hypertension: Secondary | ICD-10-CM

## 2020-09-27 DIAGNOSIS — I614 Nontraumatic intracerebral hemorrhage in cerebellum: Secondary | ICD-10-CM

## 2020-09-27 LAB — ECHOCARDIOGRAM COMPLETE
Area-P 1/2: 5.38 cm2
Height: 67 in
P 1/2 time: 364 msec
S' Lateral: 4 cm
Weight: 2720 oz

## 2020-09-27 LAB — URINALYSIS, ROUTINE W REFLEX MICROSCOPIC
Bacteria, UA: NONE SEEN
Bilirubin Urine: NEGATIVE
Glucose, UA: NEGATIVE mg/dL
Hgb urine dipstick: NEGATIVE
Ketones, ur: NEGATIVE mg/dL
Leukocytes,Ua: NEGATIVE
Nitrite: NEGATIVE
Protein, ur: 100 mg/dL — AB
Specific Gravity, Urine: 1.019 (ref 1.005–1.030)
pH: 6 (ref 5.0–8.0)

## 2020-09-27 LAB — HIV ANTIBODY (ROUTINE TESTING W REFLEX): HIV Screen 4th Generation wRfx: NONREACTIVE

## 2020-09-27 LAB — RAPID URINE DRUG SCREEN, HOSP PERFORMED
Amphetamines: NOT DETECTED
Barbiturates: NOT DETECTED
Benzodiazepines: NOT DETECTED
Cocaine: NOT DETECTED
Opiates: NOT DETECTED
Tetrahydrocannabinol: NOT DETECTED

## 2020-09-27 LAB — RESP PANEL BY RT-PCR (FLU A&B, COVID) ARPGX2
Influenza A by PCR: NEGATIVE
Influenza B by PCR: NEGATIVE
SARS Coronavirus 2 by RT PCR: NEGATIVE

## 2020-09-27 LAB — HEMOGLOBIN A1C
Hgb A1c MFr Bld: 5.9 % — ABNORMAL HIGH (ref 4.8–5.6)
Mean Plasma Glucose: 122.63 mg/dL

## 2020-09-27 LAB — LIPID PANEL
Cholesterol: 237 mg/dL — ABNORMAL HIGH (ref 0–200)
HDL: 57 mg/dL (ref >40)
LDL Cholesterol: 165 mg/dL — ABNORMAL HIGH (ref 0–99)
Total CHOL/HDL Ratio: 4.2 RATIO
Triglycerides: 76 mg/dL (ref <150)
VLDL: 15 mg/dL (ref 0–40)

## 2020-09-27 IMAGING — MR MR MRA HEAD W/O CM
1 series · 20 of 48 positions shown · non-contrast
Comparison: Brain MRI from earlier today

CLINICAL DATA: Stroke follow-up.

EXAM:
MRA HEAD WITHOUT CONTRAST
TECHNIQUE: Angiographic images of the Circle of Willis were obtained using MRA
technique without intravenous contrast.

[Series 6: 3d cow · axial · 0.5mm · 0.41mm/px · z∈[-79,+12]mm · 20 of 192 slices shown]
[im 1/192]
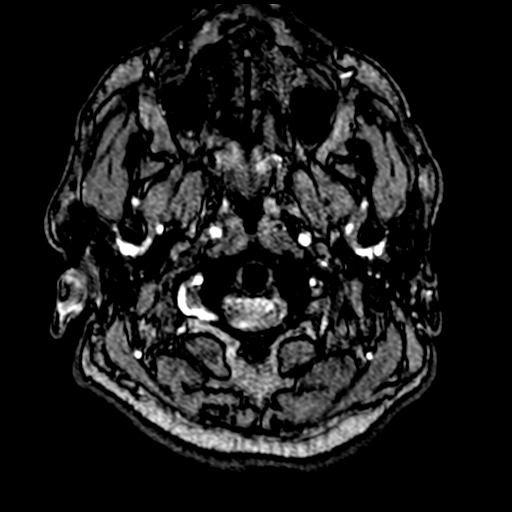
[im 5/192]
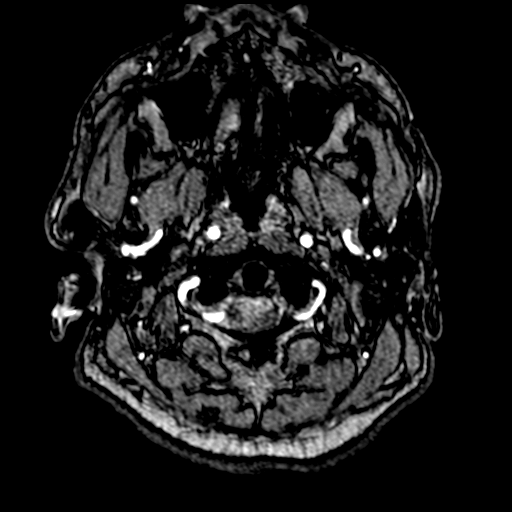
[im 9/192]
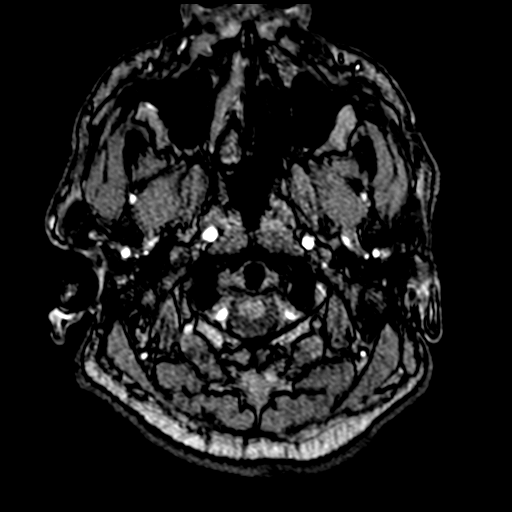
[im 13/192]
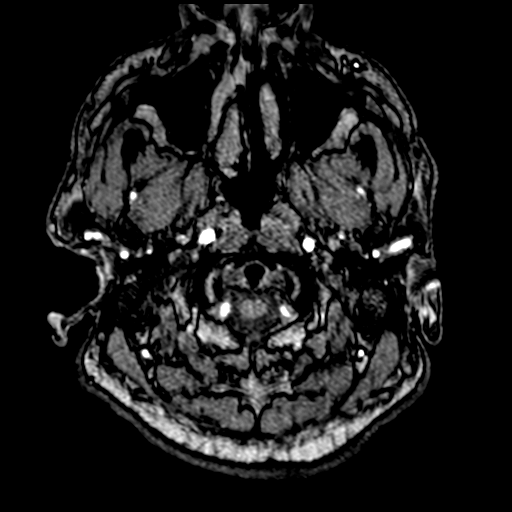
[im 17/192]
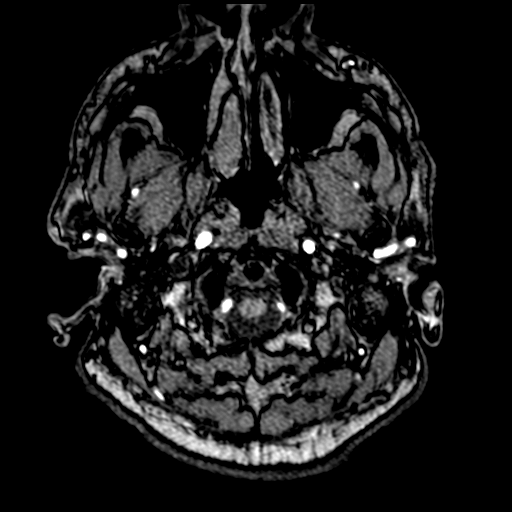
[im 21/192]
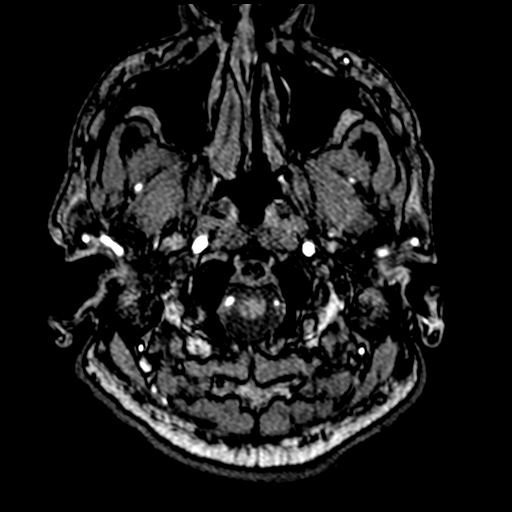
[im 25/192]
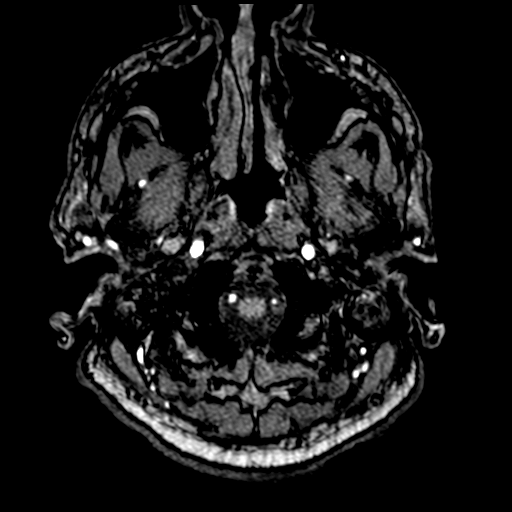
[im 29/192]
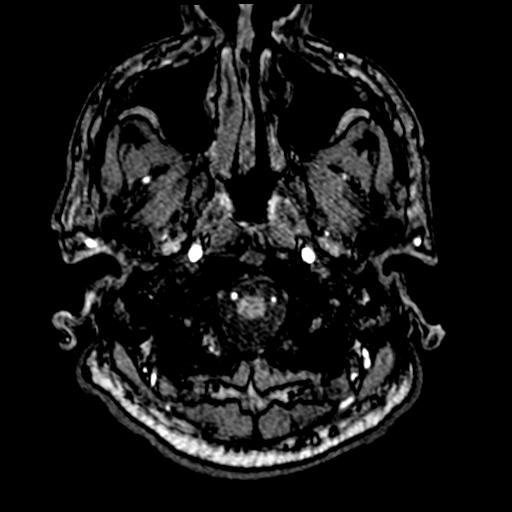
[im 33/192]
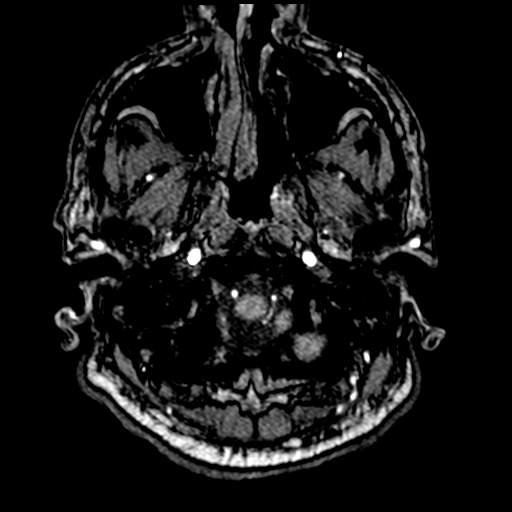
[im 37/192]
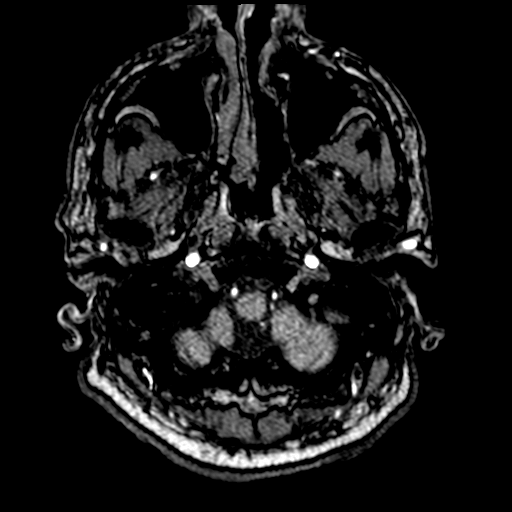
[im 41/192]
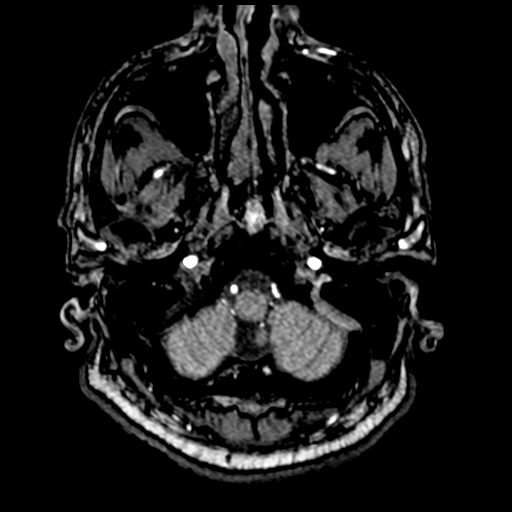
[im 45/192]
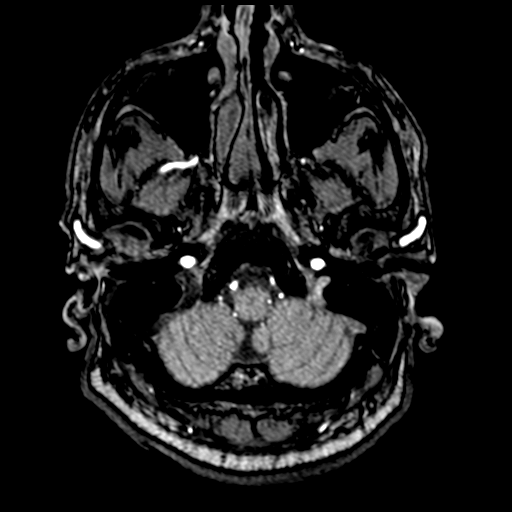
[im 61/192]
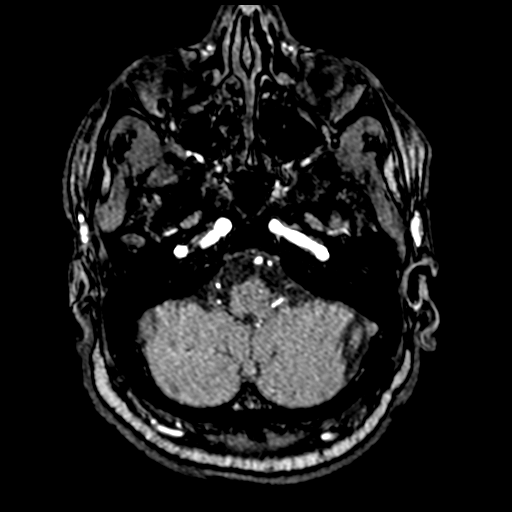
[im 86/192]
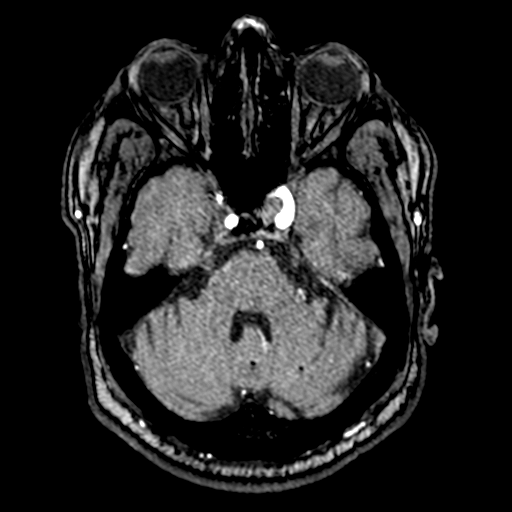
[im 98/192]
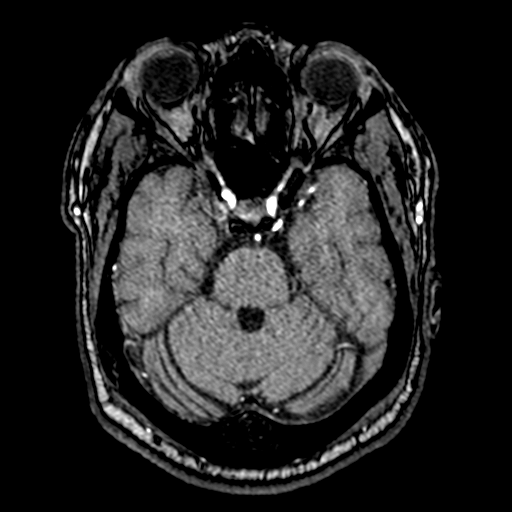
[im 110/192]
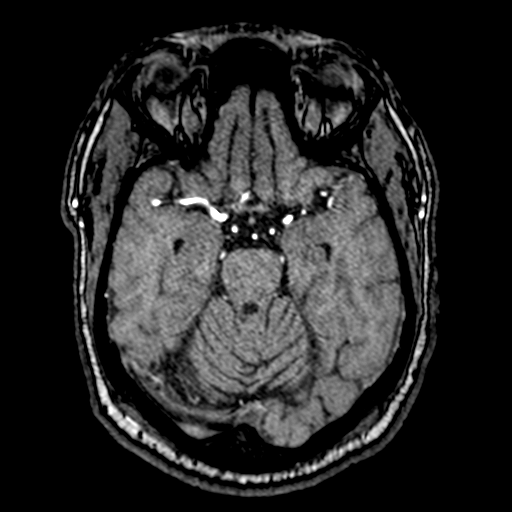
[im 135/192]
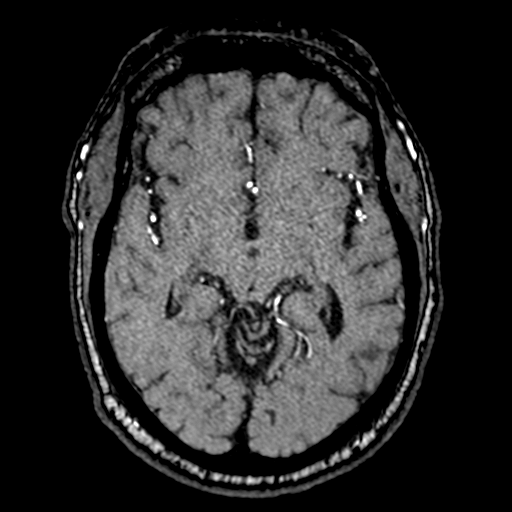
[im 159/192]
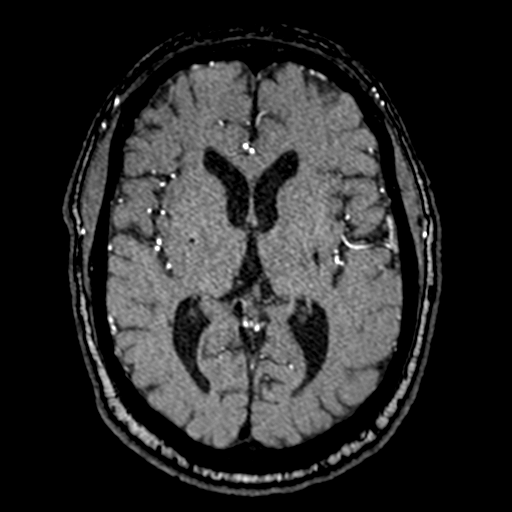
[im 163/192]
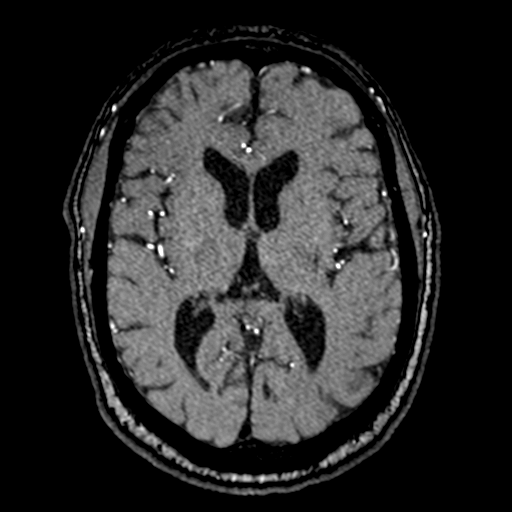
[im 183/192]
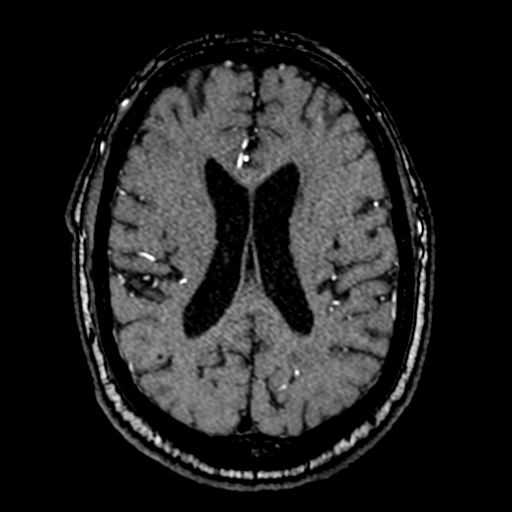

[20 of 48 positions shown; findings below may reference images not displayed]

FINDINGS: Right larger than left vertebral artery. Symmetric size carotid
arteries. No branch occlusion, beading, or aneurysm.

Generalized atheromatous irregularity with mild bilateral M1 and M2
segment narrowing. At a left M2 bifurcation there is a moderate
stenosis. There is even greater atheromatous irregularity of
bilateral proximal PCAs with high-grade bilateral P3/4 narrowings
causing flow gaps.

The left cavernous ICA is deviated laterally by a 9 mm nodule most
consistent with pituitary adenoma.

Negative for aneurysm.
IMPRESSION: 1. Generalized atheromatous irregularity especially affecting medium
size vessels in the posterior circulation. No high-grade proximal
stenosis.
2. Left sellar/cavernous 9 mm mass most consistent with pituitary
adenoma.

## 2020-09-27 IMAGING — MR MR MRA NECK W/O CM
4 of 5 series · 46 of 48 positions shown · non-contrast
Comparison: None.

CLINICAL DATA: Stroke follow-up

EXAM:
MRA NECK WITHOUT CONTRAST
TECHNIQUE: Angiographic images of the neck were obtained using MRA technique
without intravenous contrast.

[Series 9: tof_fl3d_tra_iso · axial · 0.6mm · 0.52mm/px · z∈[-186,-103]mm · 26 of 144 slices shown]
[im 1/144]
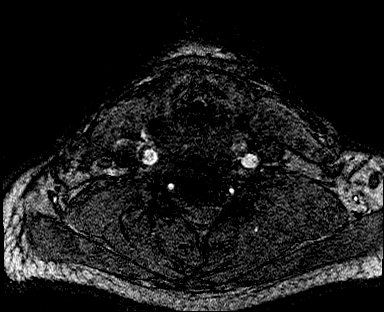
[im 6/144]
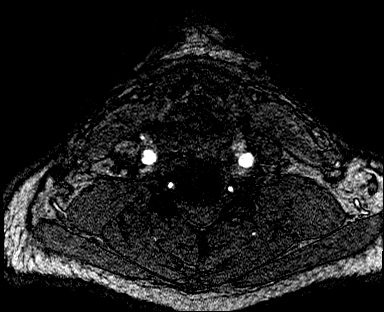
[im 12/144]
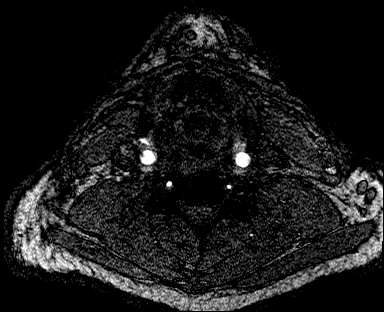
[im 18/144]
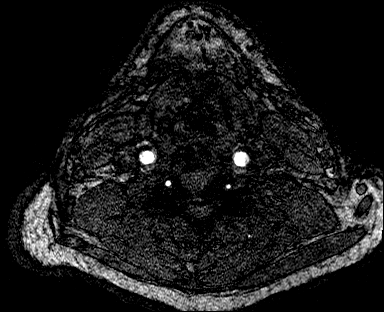
[im 23/144]
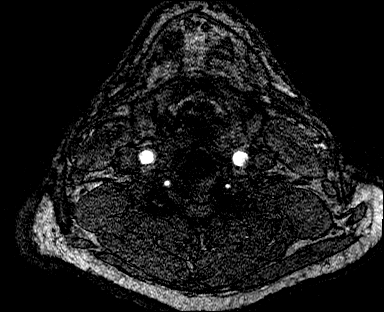
[im 29/144]
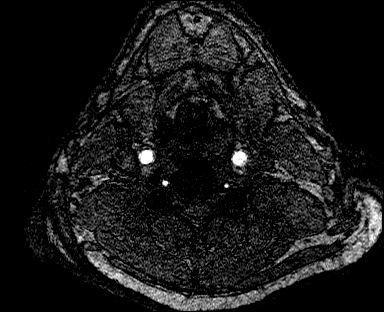
[im 35/144]
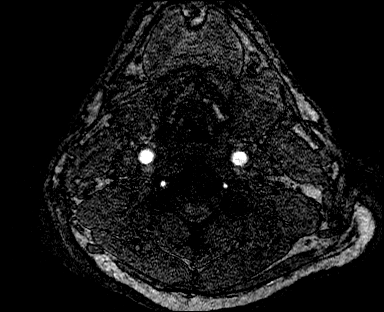
[im 41/144]
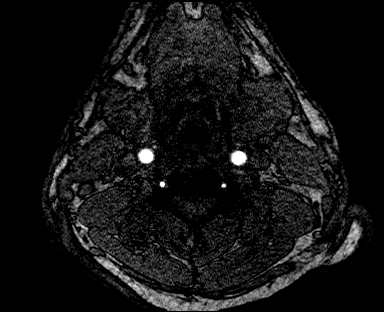
[im 46/144]
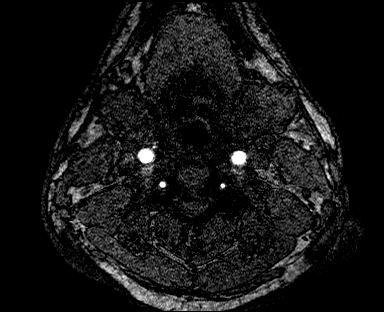
[im 52/144]
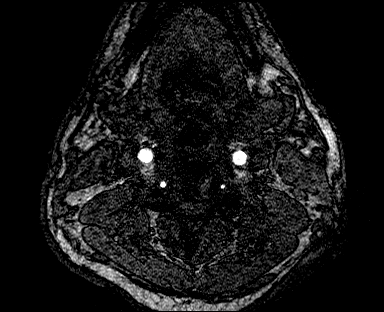
[im 58/144]
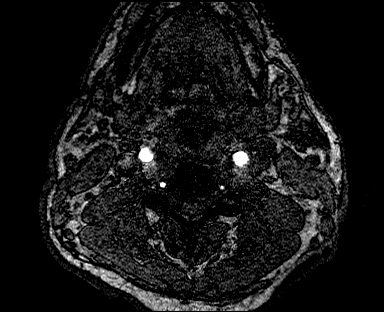
[im 63/144]
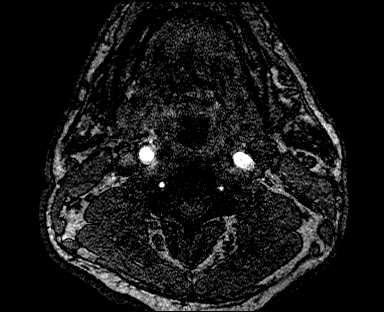
[im 69/144]
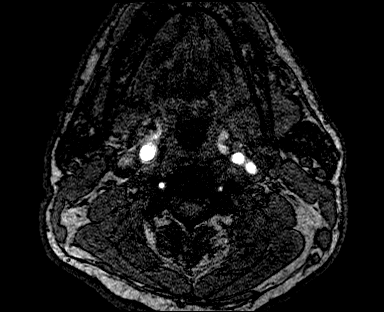
[im 75/144]
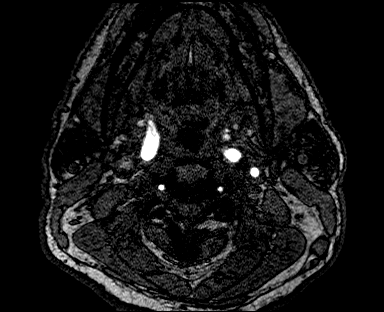
[im 81/144]
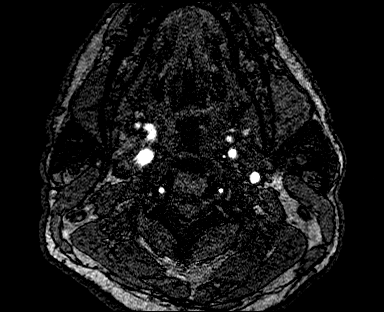
[im 86/144]
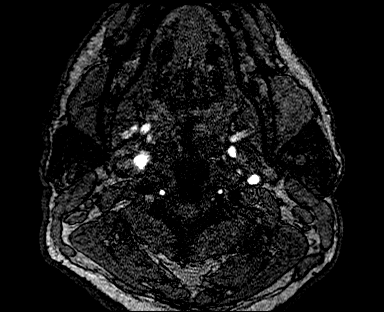
[im 92/144]
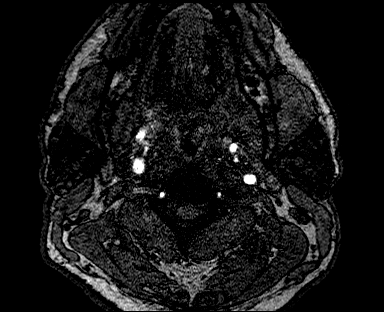
[im 98/144]
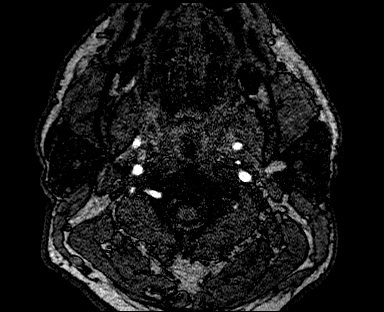
[im 103/144]
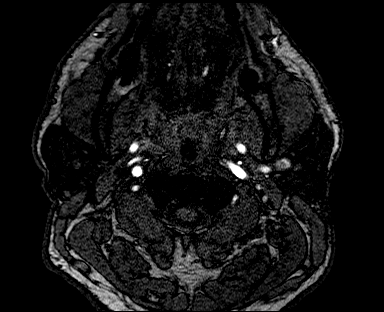
[im 109/144]
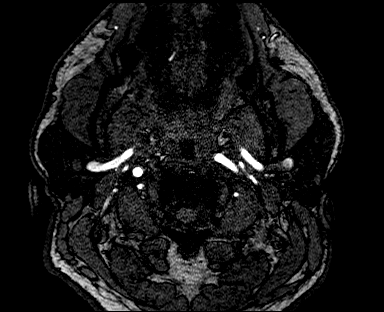
[im 115/144]
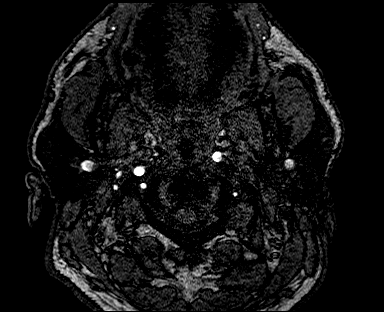
[im 121/144]
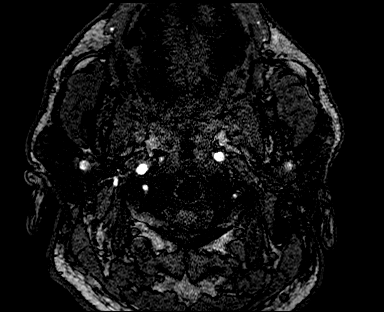
[im 126/144]
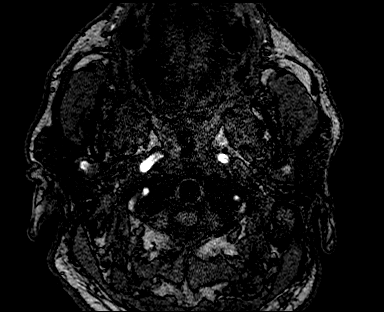
[im 132/144]
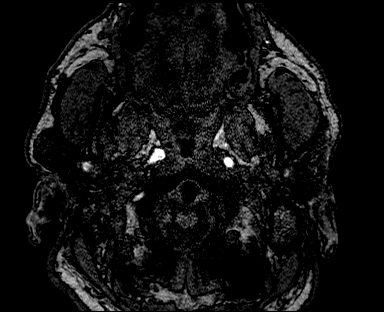
[im 138/144]
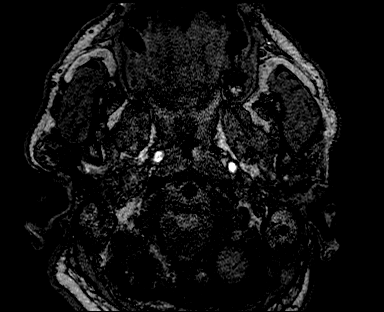
[im 144/144]
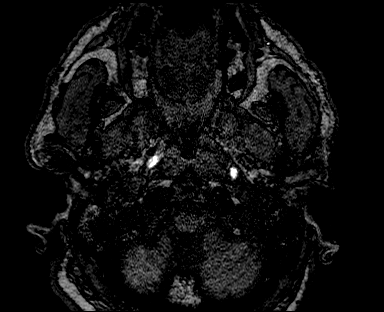

[Series 12: tof_fl2d_tra · axial · 3.5mm · 0.78mm/px · z∈[-294,-69]mm · 18 of 105 slices shown]
[im 1/105]
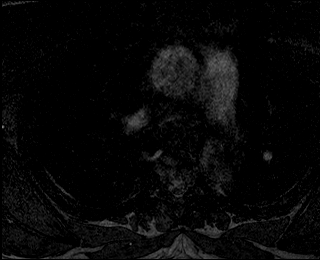
[im 6/105]
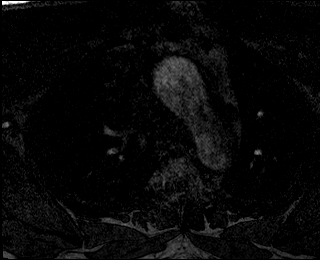
[im 12/105]
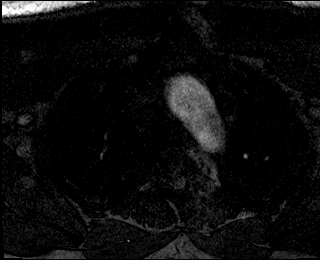
[im 18/105]
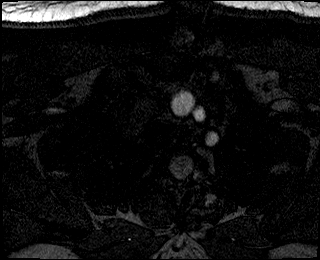
[im 24/105]
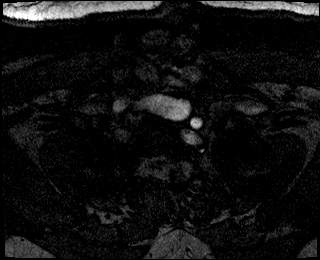
[im 29/105]
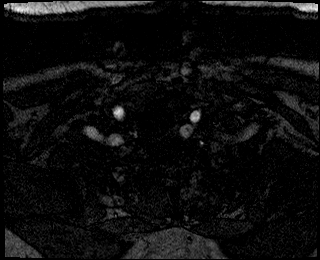
[im 35/105]
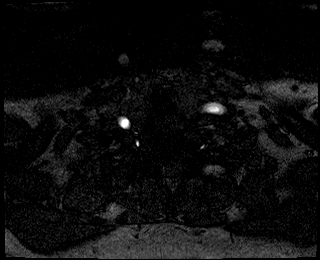
[im 41/105]
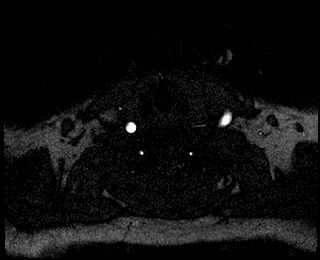
[im 47/105]
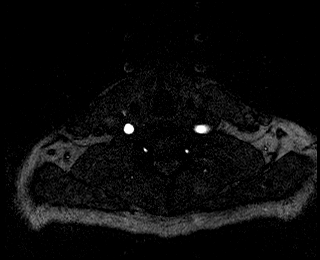
[im 53/105]
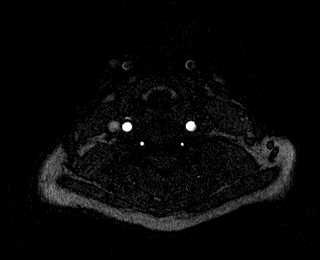
[im 58/105]
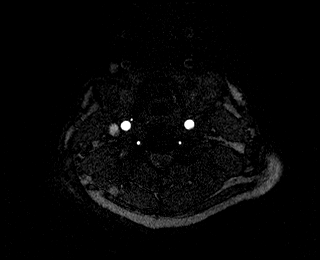
[im 64/105]
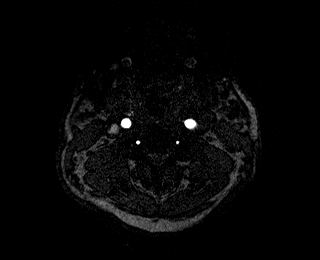
[im 70/105]
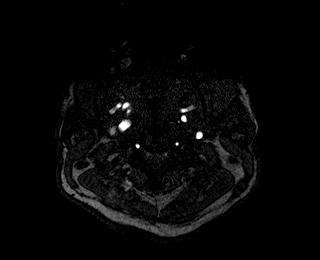
[im 76/105]
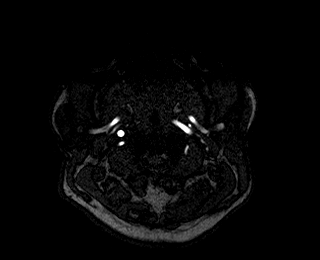
[im 81/105]
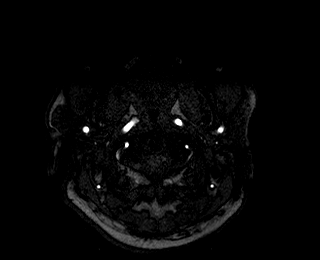
[im 87/105]
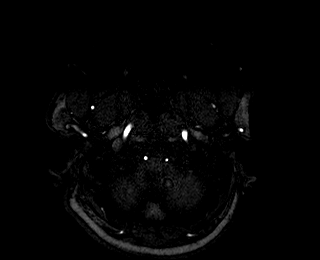
[im 93/105]
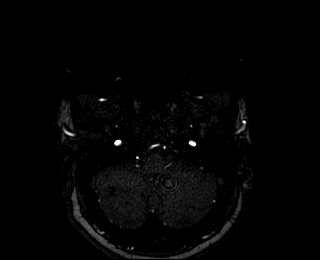
[im 99/105]
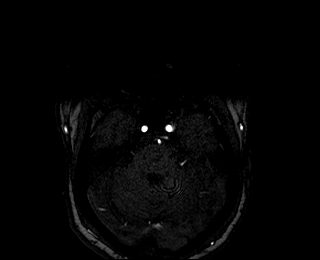

[Series 15: tof_fl2d_tra_mip_tra · axial · 247.4mm · 0.78mm/px · 1 of 1 slices shown]
[im 1/1]
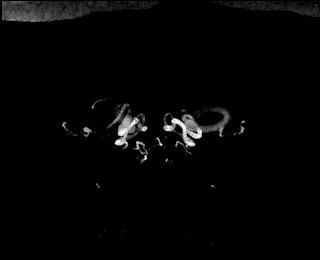

[Series 16: tof_fl2d_tra_mip_radials · coronal · 0.78mm/px · 1 of 4 slices shown]
[im 1/4]
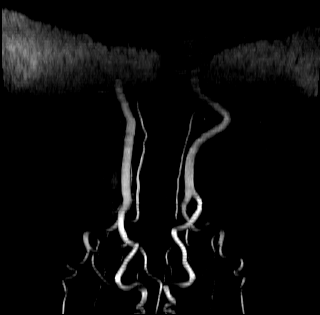

[46 of 48 positions shown; findings below may reference images not displayed]

FINDINGS: Antegrade flow in bilateral carotid and vertebral arteries.

Normal size arch with 4 vessel branching. There is carotid and
vertebral tortuosity without stenosis, ulceration, or beading. The
left vertebral artery appears slightly irregular compared to the
right on reformats, but this is likely from artifact and lumen size,
the vessel appears smooth on source images.
IMPRESSION: Negative neck MRA.

## 2020-09-27 MED ORDER — HYDRALAZINE HCL 20 MG/ML IJ SOLN
10.0000 mg | Freq: Four times a day (QID) | INTRAMUSCULAR | Status: DC | PRN
  Administered 2020-09-27: 10 mg via INTRAVENOUS
  Filled 2020-09-27: qty 1

## 2020-09-27 MED ORDER — ATORVASTATIN CALCIUM 80 MG PO TABS
80.0000 mg | ORAL_TABLET | Freq: Every day | ORAL | Status: DC
  Administered 2020-09-27: 80 mg via ORAL
  Filled 2020-09-27: qty 1

## 2020-09-27 MED ORDER — AMLODIPINE BESYLATE 10 MG PO TABS: 10.0000 mg | ORAL_TABLET | Freq: Every day | ORAL | 1 refills | Status: DC

## 2020-09-27 MED ORDER — ATORVASTATIN CALCIUM 80 MG PO TABS: 80.0000 mg | ORAL_TABLET | Freq: Every day | ORAL | 1 refills | Status: DC

## 2020-09-27 MED ORDER — CLOPIDOGREL BISULFATE 75 MG PO TABS: 75.0000 mg | ORAL_TABLET | Freq: Every day | ORAL | 0 refills | Status: AC

## 2020-09-27 MED ORDER — LABETALOL HCL 5 MG/ML IV SOLN
20.0000 mg | INTRAVENOUS | Status: DC | PRN
  Administered 2020-09-27: 20 mg via INTRAVENOUS
  Filled 2020-09-27: qty 4

## 2020-09-27 MED ORDER — CLONIDINE HCL 0.1 MG PO TABS: 0.1000 mg | ORAL_TABLET | Freq: Four times a day (QID) | ORAL | Status: DC | PRN

## 2020-09-27 MED ORDER — COLCHICINE 0.6 MG PO TABS: 0.6000 mg | ORAL_TABLET | Freq: Every day | ORAL | 0 refills | Status: DC | PRN

## 2020-09-27 MED ORDER — METOPROLOL SUCCINATE ER 25 MG PO TB24: 12.5000 mg | ORAL_TABLET | Freq: Every day | ORAL | 0 refills | Status: DC

## 2020-09-27 MED ORDER — ASPIRIN 81 MG PO TBEC: 81.0000 mg | DELAYED_RELEASE_TABLET | Freq: Every day | ORAL | 1 refills | Status: DC

## 2020-09-27 MED ORDER — LISINOPRIL 20 MG PO TABS
20.0000 mg | ORAL_TABLET | Freq: Every day | ORAL | Status: DC
  Administered 2020-09-27: 20 mg via ORAL
  Filled 2020-09-27: qty 1

## 2020-09-27 MED ORDER — HYDROCHLOROTHIAZIDE 12.5 MG PO CAPS: 25.0000 mg | ORAL_CAPSULE | Freq: Every day | ORAL | 1 refills | Status: DC

## 2020-09-27 MED ORDER — LISINOPRIL 20 MG PO TABS: 20.0000 mg | ORAL_TABLET | Freq: Every day | ORAL | 1 refills | Status: DC

## 2020-09-27 MED ORDER — ASPIRIN EC 81 MG PO TBEC
81.0000 mg | DELAYED_RELEASE_TABLET | Freq: Every day | ORAL | Status: DC
  Administered 2020-09-27: 81 mg via ORAL
  Filled 2020-09-27: qty 1

## 2020-09-27 MED ORDER — CLOPIDOGREL BISULFATE 75 MG PO TABS
75.0000 mg | ORAL_TABLET | Freq: Every day | ORAL | Status: DC
  Administered 2020-09-27: 75 mg via ORAL
  Filled 2020-09-27 (×2): qty 1

## 2020-09-27 MED ORDER — METOPROLOL SUCCINATE ER 25 MG PO TB24
12.5000 mg | ORAL_TABLET | Freq: Every day | ORAL | Status: DC
  Administered 2020-09-27: 12.5 mg via ORAL
  Filled 2020-09-27: qty 1

## 2020-09-27 NOTE — Progress Notes (Signed)
*  PRELIMINARY RESULTS* Echocardiogram 2D Echocardiogram has been performed.  Carlos Hayes 09/27/2020, 2:27 PM

## 2020-09-27 NOTE — Evaluation (Signed)
Occupational Therapy Evaluation Patient Details Name: Carlos Hayes MRN: [id number] DOB: May 12, 1961 Today's Date: 09/27/2020    History of Present Illness 60 year old male with transient facial droop and slurred speech most consistent with transient ischemic attack. MRI showed small vessel ischemic changes affecting the pons, cerebellum, thalami, basal ganglia, and cerebral hemisphere white matter consistent with HTN small vessel disease;  there is a focus of edema associated with microhemorrhage in Rt cerebellum.   MRA showed Left sellar/cavernous 49mm mass most consistent with pituitary adenoma.  PMH includes essential HTN and gout.   Clinical Impression   Pt admitted with above. He demonstrates the below listed deficits and will benefit from continued OT to maximize safety and independence with BADLs.  Pt presents to OT with questionable cognitive impairment specifically with ? Memory deficit.  He scored 7/28 on the Short Blessed Test which his indicative of questionable impairment.  Will follow acutely, and recommend OPOT.  Also recommend he have direct supervision with medication management, and financial management initially.       Follow Up Recommendations  Outpatient OT;Supervision - Intermittent    Equipment Recommendations  None recommended by OT    Recommendations for Other Services       Precautions / Restrictions Precautions Precautions: None      Mobility Bed Mobility Overal bed mobility: Independent                  Transfers Overall transfer level: Independent                    Balance Overall balance assessment: No apparent balance deficits (not formally assessed)                                         ADL either performed or assessed with clinical judgement   ADL Overall ADL's : Modified independent                                       General ADL Comments: pt able to perform ADLs and was able to  perform simulated shower in standing without LOB     Vision Baseline Vision/History: No visual deficits Patient Visual Report: No change from baseline Vision Assessment?: No apparent visual deficits;Yes Eye Alignment: Within Functional Limits Ocular Range of Motion: Within Functional Limits Alignment/Gaze Preference: Within Defined Limits Tracking/Visual Pursuits: Able to track stimulus in all quads without difficulty Visual Fields: No apparent deficits     Perception Perception Perception Tested?: Yes   Praxis Praxis Praxis tested?: Within functional limits    Pertinent Vitals/Pain Pain Assessment: No/denies pain     Hand Dominance Right   Extremity/Trunk Assessment Upper Extremity Assessment Upper Extremity Assessment: Overall WFL for tasks assessed   Lower Extremity Assessment Lower Extremity Assessment: Overall WFL for tasks assessed   Cervical / Trunk Assessment Cervical / Trunk Assessment: Normal   Communication Communication Communication: No difficulties   Cognition Arousal/Alertness: Awake/alert Behavior During Therapy: WFL for tasks assessed/performed Overall Cognitive Status: No family/caregiver present to determine baseline cognitive functioning Area of Impairment: Memory;Awareness                     Memory: Decreased short-term memory     Awareness: Emergent   General Comments: The Short Blessed Test was administered.  He scored  7/28 which is indicative of questionable cognitive impairmemtn.  Heunaware of the time of day - stated it was 2:30, correct answer 1055 (he had just looked at his phone for other info), and demonstrated difficulty with recall of information.  When asked, pt denies memory deficits   General Comments       Exercises     Shoulder Instructions      Home Living Family/patient expects to be discharged to:: Private residence Living Arrangements: Spouse/significant other Available Help at Discharge: Family Type of  Home: House Home Access: Level entry     [place]: One level     Bathroom Shower/Tub: Occupational psychologist: [place]: None          Prior Functioning/Environment Level of Independence: Independent        Comments: Pt reports to OT that he doesn't work, drives, and does yard work at his home.  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        OT Problem List: Decreased cognition      OT Treatment/Interventions: Self-care/ADL training;Therapeutic activities;Cognitive remediation/compensation;Patient/family education    OT Goals(Current goals can be found in the care plan section) Acute Rehab OT Goals Patient Stated Goal: to go home today OT Goal Formulation: With patient Time For Goal Achievement: 10/04/20 Potential to Achieve Goals: Good ADL Goals Additional ADL Goal #1: (P) Pt will perform moderately complex path finding task with no cues Additional ADL Goal #2: (P) Pt will follow 3 step command independently during functional tasks  OT Frequency: Min 2X/week   Barriers to D/C:            Co-evaluation              AM-PAC OT "6 Clicks" Daily Activity     Outcome Measure Help from another person eating meals?: None Help from another person taking care of personal grooming?: None Help from another person toileting, which includes using toliet, bedpan, or urinal?: None Help from another person bathing (including washing, rinsing, drying)?: None Help from another person to put on and taking off regular upper body clothing?: None Help from another person to put on and taking off regular lower body clothing?: None 6 Click Score: 24   End of Session Nurse Communication: Mobility status  Activity Tolerance: Patient tolerated treatment well Patient left: Other (comment) (with PT)  OT Visit Diagnosis: Cognitive communication deficit (R41.841) Symptoms and signs involving cognitive  functions: Cerebral infarction                Time: 3532-9924 OT Time Calculation (min): 14 min Charges:  OT General Charges $OT Visit: 1 Visit OT Evaluation $OT Eval Low Complexity: 1 Low  Nilsa Nutting., OTR/L Acute Rehabilitation Services Pager [phone] Office [phone]   Lucille Passy M 09/27/2020, 11:34 AM

## 2020-09-27 NOTE — ED Notes (Signed)
Patient to MRI.

## 2020-09-27 NOTE — Consult Note (Signed)
Neurology Consultation Reason for Consult: Transient neurological deficit Referring Physician: Ralene Bathe, E  CC: Transient Neurological deficit  History is obtained from: Patient  HPI: Carlos Hayes is a 60 y.o. male with history of hypertension who presents with transient facial droop and drooling that occurred earlier today.  He woke up in his normal state of health and then approximately 8 AM he noticed that he was slurring his speech and was drooling.  Some of that he is with commented on it as well.  He was evaluated in the emergency department where an MRI was performed which demonstrates a cerebellar lesion with some T2 signal the radiology feels is consistent with a recent hemorrhagic infarct.  He denies any vertigo, or history of episodes of vertigo in the past.  Denies any nausea or vomiting, double vision, or unsteady gait.  He does not know that he has had strokes in the past.   LKW: 8 AM tpa given?: no, resolution of symptoms    ROS: A 14 point ROS was performed and is negative except as noted in the HPI.  Past Medical History:  Diagnosis Date  . Gout   . Hypertension      Family history: No history of similar   Social History:  reports that he has never smoked. He has never used smokeless tobacco. He reports current alcohol use. He reports that he does not use drugs.   Exam: Current vital signs: BP (!) 197/111   Pulse 80   Temp 98.9 F (37.2 C)   Resp 18   SpO2 97%  Vital signs in last 24 hours: Temp:  [98.9 F (37.2 C)] 98.9 F (37.2 C) (02/18 2155) Pulse Rate:  [78-90] 80 (02/19 0027) Resp:  [15-20] 18 (02/19 0027) BP: (192-221)/(105-131) 197/111 (02/19 0027) SpO2:  [95 %-99 %] 97 % (02/19 0027)   Physical Exam  Constitutional: Appears well-developed and well-nourished.  Psych: Affect appropriate to situation Eyes: No scleral injection HENT: No OP obstruction MSK: no joint deformities.  Cardiovascular: Normal rate and regular rhythm.   Respiratory: Effort normal, non-labored breathing GI: Soft.  No distension. There is no tenderness.  Skin: WDI  Neuro: Mental Status: Patient is awake, alert, oriented to person, place, month, year, and situation. Patient is able to give a clear and coherent history. No signs of aphasia or neglect Cranial Nerves: II: Visual Fields are full. Pupils are equal, round, and reactive to light.   III,IV, VI: EOMI without ptosis or diploplia.  V: Facial sensation is symmetric to temperature VII: Facial movement is symmetric.  VIII: hearing is intact to voice X: Uvula elevates symmetrically XI: Shoulder shrug is symmetric. XII: tongue is midline without atrophy or fasciculations.  Motor: Tone is normal. Bulk is normal. 5/5 strength was present in all four extremities.  Sensory: Sensation is symmetric to light touch and temperature in the arms and legs. 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  I am not sure if the cerebellar lesion is truly subacute, but even if it is, with no hyperdensity on CT, I would not have any concerns about using antiplatelet therapy AS secondary stroke prevention.  He will need risk factor  modification including hypertension control and antiplatelet therapy.  Recommendations: - HgbA1c, fasting lipid panel - Frequent neuro checks - Echocardiogram - MRA head and neck.  - Prophylactic therapy-Antiplatelet med: Aspirin - dose 325mg  PO or 300mg  PR - Risk factor modification - Telemetry monitoring - PT consult, OT consult, Speech consult - Stroke team to  follow    Roland Rack, MD [facility] [phone]  If 7pm- 7am, please page neurology on call as listed in [place].

## 2020-09-27 NOTE — ED Notes (Signed)
Carlos Hayes called and updated on BP, handoff completed, room changed to 5C15.

## 2020-09-27 NOTE — Discharge Summary (Signed)
Physician Discharge Summary  Carlos Hayes BDZ:[id number] DOB: 1961-04-15 DOA: 09/26/2020  PCP: Patient, No Pcp Per  Admit date: 09/26/2020 Discharge date: 09/27/2020  Admitted From: home Disposition:  Home   Recommendations for Outpatient Follow-up:  1. Follow up with PCP in 1-2 weeks 2. Establish care and follow-up with endocrinology 3. Establish care and follow-up with cardiology 4. Follow-up with neurology  Home Health: none Equipment/Devices: none  Discharge Condition: stable CODE STATUS: Full code Diet recommendation: low sodium, heart healthy  HPI: Per admitting MD, Carlos Hayes is a 59 y.o. male with medical history significant for essential hypertension and gout, presented to the emergency room with acute onset of transient slurred speech with expressive dysphasia without paresthesias or focal muscle weakness or facial droop or dysphagia.  No chest pain or palpitations.  No tinnitus or vertigo.  No headache or dizziness or blurred vision.  No fever or chills.  No nausea or vomiting or abdominal pain.  No urinary or stool incontinence.  No dysuria, oliguria or hematuria or flank pain.  The patient takes aspirin on a daily basis. ED Course: Initial blood pressure was 208 /116 with otherwise normal vital signs.  It has been as high as 219/131.  Labs reveals a creatinine 1.42 compared to 1.49 on 03/04/2020.  UA came back negative  Hospital Course / Discharge diagnoses: Principal problem TIA versus hypertensive encephalopathy -neurology was consulted and followed patient while hospitalized.  He underwent a CT scan which showed no acute findings.  An MRI showed scattered cerebral microbleeds like related to longstanding poorly controlled chronic hypertension.  An MRA of the head was unremarkable.  LDL 165, A1c 5.9.  He was placed on a statin.  Patient was placed on aspirin and Plavix for 3 weeks then he should remain on aspirin alone.  He will follow-up with neurology.  His  symptoms cannot completely resolved, he is asking to go home, he is ambulatory and back to baseline.   Active problems  Hyperlipidemia-LDL 165, started on statin Malignant hypertension-patient was resumed on his home amlodipine and HCTZ (which he has not been taking in the past 2 years), lisinopril as well as metoprolol were added. NSVT -patient had 2 quick 5 beats of NSVT, started on metoprolol Pituitary adenoma -this will be need to be worked up as an outpatient, needs to follow-up with endocrinology Chronic diastolic CHF, moderate aortic valve regurgitation -2D echo done and showed normal EF 55-60%, LVH, and grade 2 diastolic dysfunction.  He was hypertensive while getting the echo done which probably worsened his aortic regurgitation.  Briefly discussed with cardiology reading MD, will need follow-up with cardiology as an outpatient.  Sepsis ruled out   Discharge Instructions  Discharge Instructions    Ambulatory referral to Neurology   Complete by: As directed    Follow up with stroke clinic NP (Jessica Vanschaick or Cecille Rubin, if both not available, consider Zachery Dauer, or Ahern) at [facility] in about 4 weeks. Thanks.     Allergies as of 09/27/2020   No Known Allergies     Medication List    TAKE these medications   amLODipine 10 MG tablet Commonly known as: NORVASC Take 1 tablet (10 mg total) by mouth daily.   APPLE CIDER VINEGAR PO Take 1 capsule by mouth daily.   aspirin 81 MG EC tablet Take 1 tablet (81 mg total) by mouth daily. Swallow whole. Start taking on: September 28, 2020   atorvastatin 80 MG tablet Commonly known as: LIPITOR Take 1  tablet (80 mg total) by mouth daily. Start taking on: September 28, 2020   clopidogrel 75 MG tablet Commonly known as: PLAVIX Take 1 tablet (75 mg total) by mouth daily for 21 days. Start taking on: September 28, 2020   colchicine 0.6 MG tablet Take 1 tablet (0.6 mg total) by mouth daily as needed (gout). What changed:    when to take this  reasons to take this   hydrochlorothiazide 12.5 MG capsule Commonly known as: Microzide Take 2 capsules (25 mg total) by mouth daily.   lisinopril 20 MG tablet Commonly known as: ZESTRIL Take 1 tablet (20 mg total) by mouth daily. Start taking on: September 28, 2020   metoprolol succinate 25 MG 24 hr tablet Commonly known as: TOPROL-XL Take 0.5 tablets (12.5 mg total) by mouth daily. Start taking on: September 28, 2020   naproxen sodium 220 MG tablet Commonly known as: ALEVE Take 440 mg by mouth daily as needed (headache/pain).   thiamine 50 MG tablet Commonly known as: VITAMIN B-1 Take 50 mg by mouth daily.       Follow-up Information    [facility]. Schedule an appointment as soon as possible for a visit in 4 week(s).   Specialty: Neurology Contact information: [address] [place]. Schedule an appointment as soon as possible for a visit in 2 week(s).   Why: to establish care Contact information: [address] [place] [phone]       [facility]. Schedule an appointment as soon as possible for a visit in 4 week(s).   Specialty: Internal Medicine Contact information: [place], [place] [address] [phone]              Consultations:  Neurology   Procedures/Studies:  CT Head Wo Contrast  Result Date: 09/26/2020 CLINICAL DATA:  TIA EXAM: CT HEAD WITHOUT CONTRAST TECHNIQUE: Contiguous axial images were obtained from the base of the skull through the vertex without intravenous contrast. COMPARISON:  None. FINDINGS: Brain: No evidence of acute infarction, hemorrhage, hydrocephalus, extra-axial collection or mass lesion/mass effect. Mild periventricular white matter hypodensities consistent with sequela of chronic microvascular ischemic  disease. Vascular: No hyperdense vessel or unexpected calcification. Skull: Normal. Negative for fracture or focal lesion. Sinuses/Orbits: No acute finding. Other: Bilateral ear cerumen. IMPRESSION: No acute intracranial abnormality. Electronically Signed   By: Valentino Saxon MD   On: 09/26/2020 15:33   MR ANGIO HEAD WO CONTRAST  Result Date: 09/27/2020 CLINICAL DATA:  Stroke follow-up. EXAM: MRA HEAD WITHOUT CONTRAST TECHNIQUE: Angiographic images of the Circle of Willis were obtained using MRA technique without intravenous contrast. COMPARISON:  Brain MRI from earlier today FINDINGS: Right larger than left vertebral artery. Symmetric size carotid arteries. No branch occlusion, beading, or aneurysm. Generalized atheromatous irregularity with mild bilateral M1 and M2 segment narrowing. At a left M2 bifurcation there is a moderate stenosis. There is even greater atheromatous irregularity of bilateral proximal PCAs with high-grade bilateral P3/4 narrowings causing flow gaps. The left cavernous ICA is deviated laterally by a 9 mm nodule most consistent with pituitary adenoma. Negative for aneurysm. IMPRESSION: 1. Generalized atheromatous irregularity especially affecting medium size vessels in the posterior circulation. No high-grade proximal stenosis. 2. Left sellar/cavernous 9 mm mass most consistent with pituitary adenoma. Electronically Signed   By: Monte Fantasia M.D.   On: 09/27/2020 05:20   MR ANGIO  NECK WO CONTRAST  Result Date: 09/27/2020 CLINICAL DATA:  Stroke follow-up EXAM: MRA NECK WITHOUT CONTRAST TECHNIQUE: Angiographic images of the neck were obtained using MRA technique without intravenous contrast. COMPARISON:  None. FINDINGS: Antegrade flow in bilateral carotid and vertebral arteries. Normal size arch with 4 vessel branching. There is carotid and vertebral tortuosity without stenosis, ulceration, or beading. The left vertebral artery appears slightly irregular compared to the right on  reformats, but this is likely from artifact and lumen size, the vessel appears smooth on source images. IMPRESSION: Negative neck MRA. Electronically Signed   By: Monte Fantasia M.D.   On: 09/27/2020 05:26   MR BRAIN WO CONTRAST  Result Date: 09/26/2020 CLINICAL DATA:  Acute presentation with slurred speech. Hand weakness. EXAM: MRI HEAD WITHOUT CONTRAST TECHNIQUE: Multiplanar, multiecho pulse sequences of the brain and surrounding structures were obtained without intravenous contrast. COMPARISON:  Head CT same day FINDINGS: Brain: Diffusion imaging does not show any acute in far shin/restricted diffusion. Molli Hazard shows a few old small vessel infarctions with a single focus of hemosiderin deposition in the ventral pons. Cerebral hemispheres show numerous foci of hemosiderin deposition, most appearing old and probably associated with old small vessel infarctions. Within the right cerebellum, there is a small focus of hemosiderin deposition with surrounding edema. This could be a recent hemorrhagic small vessel infarction. Cerebral hemispheres show moderate chronic small-vessel ischemic change of the thalami and hemispheric white matter, many associated with punctate hemosiderin deposition. Old hemorrhagic lacunar infarction in the external capsule on the left. This type of appearance is presumed secondary to hypertensive disease. The differential diagnosis would be an early manifestation of amyloid angiopathy. No large vessel territory infarction. No mass lesion, hydrocephalus or extra-axial collection. Vascular: Major vessels at the base of the brain show flow. Skull and upper cervical spine: Negative Sinuses/Orbits: Clear/normal Other: None IMPRESSION: 1. Small-vessel ischemic changes affecting the pons, cerebellum, thalami, basal ganglia and cerebral hemispheric white matter. 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 Within the  right cerebellum, there is a focus of edema associated with a microhemorrhage. This probably represents a recent hemorrhagic small vessel infarction. No other identifiable acute intracranial finding. Electronically Signed   By: Nelson Chimes M.D.   On: 09/26/2020 21:10   ECHOCARDIOGRAM COMPLETE  Result Date: 09/27/2020    ECHOCARDIOGRAM REPORT   Patient Name:   Carlos Hayes Date of Exam: 09/27/2020 Medical Rec #:  [id number]         Height:       67.0 in Accession #:    [id number]        Weight:       170.0 lb Date of Birth:  Dec 22, 1960         BSA:          1.887 m Patient Age:    39 years          BP:           187/94 mmHg Patient Gender: M                 HR:           76 bpm. Exam Location:  [facility] Procedure: 2D Echo, Cardiac Doppler and Color Doppler Indications:    TIA 435.9  History:        Patient has no prior history of Echocardiogram examinations.  Risk Factors:Hypertension. Gout (From Hx).  Sonographer:    Alvino Chapel RCS Referring Phys: [id number] [place]  1. Left ventricular ejection fraction, by estimation, is 55 to 60%. The left ventricle has normal function. Left ventricular endocardial border not optimally defined to evaluate regional wall motion. There is moderate left ventricular hypertrophy. Left ventricular diastolic parameters are consistent with Grade II diastolic dysfunction (pseudonormalization).  2. Right ventricular systolic function is normal. The right ventricular size is normal. Tricuspid regurgitation signal is inadequate for assessing PA pressure.  3. Left atrial size was moderately dilated.  4. Right atrial size was mild to moderately dilated.  5. The mitral valve is normal in structure. Trivial mitral valve regurgitation. No evidence of mitral stenosis.  6. The aortic valve is abnormal. Aortic valve regurgitation is moderate to moderate-severe. No aortic stenosis is present. Aortic regurgitation PHT measures 364 msec. Note BP at time of study  is 187/94 mmHg. AV appears mildly degenerative, no definite leaflet perforation noted. Jet of AR is eccentric and directed toward anterior mitral leaflet. Quantitation challenging with eccentric jet.  7. The inferior vena cava is normal in size with greater than 50% respiratory variability, suggesting right atrial pressure of 3 mmHg.  8. Cannot exclude atrial level shunt. FINDINGS  Left Ventricle: Left ventricular ejection fraction, by estimation, is 55 to 60%. The left ventricle has normal function. Left ventricular endocardial border not optimally defined to evaluate regional wall motion. The left ventricular internal cavity size was normal in size. There is moderate left ventricular hypertrophy. Left ventricular diastolic parameters are consistent with Grade II diastolic dysfunction (pseudonormalization). Right Ventricle: The right ventricular size is normal. No increase in right ventricular wall thickness. Right ventricular systolic function is normal. Tricuspid regurgitation signal is inadequate for assessing PA pressure. Left Atrium: Left atrial size was moderately dilated. Volume may be overestimated. Right Atrium: Right atrial size was mild to moderately dilated. Pericardium: There is no evidence of pericardial effusion. Mitral Valve: The mitral valve is normal in structure. Trivial mitral valve regurgitation. No evidence of mitral valve stenosis. Tricuspid Valve: The tricuspid valve is normal in structure. Tricuspid valve regurgitation is mild . No evidence of tricuspid stenosis. Aortic Valve: The aortic valve is abnormal. Aortic valve regurgitation is moderate to severe. Aortic regurgitation PHT measures 364 msec. No aortic stenosis is present. Pulmonic Valve: The pulmonic valve was normal in structure. Pulmonic valve regurgitation is mild. No evidence of pulmonic stenosis. Aorta: The aortic root is normal in size and structure. Venous: The inferior vena cava is normal in size with greater than 50%  respiratory variability, suggesting right atrial pressure of 3 mmHg. IAS/Shunts: Cannot exclude atrial level shunt.  LEFT VENTRICLE PLAX 2D LVIDd:         5.30 cm  Diastology LVIDs:         4.00 cm  LV e' medial:    4.78 cm/s LV PW:         1.60 cm  LV E/e' medial:  15.6 LV IVS:        1.50 cm  LV e' lateral:   5.43 cm/s LVOT diam:     2.40 cm  LV E/e' lateral: 13.8 LV SV:         86 LV SV Index:   46 LVOT Area:     4.52 cm  RIGHT VENTRICLE RV S prime:     15.30 cm/s TAPSE (M-mode): 2.7 cm LEFT ATRIUM  Index       RIGHT ATRIUM           Index LA diam:        5.00 cm  2.65 cm/m  RA Area:     27.40 cm LA Vol (A2C):   124.0 ml 65.70 ml/m RA Volume:   90.30 ml  47.85 ml/m LA Vol (A4C):   136.0 ml 72.06 ml/m LA Biplane Vol: 140.0 ml 74.18 ml/m  AORTIC VALVE LVOT Vmax:   95.35 cm/s LVOT Vmean:  61.950 cm/s LVOT VTI:    0.190 m AI PHT:      364 msec  AORTA Ao Root diam: 3.40 cm MITRAL VALVE MV Area (PHT): 5.38 cm    SHUNTS MV Decel Time: 141 msec    Systemic VTI:  0.19 m MV E velocity: 74.70 cm/s  Systemic Diam: 2.40 cm MV A velocity: 40.80 cm/s MV E/A ratio:  1.83 Cherlynn Kaiser MD Electronically signed by Cherlynn Kaiser MD Signature Date/Time: 09/27/2020/4:52:33 PM    Final       Subjective: - no chest pain, shortness of breath, no abdominal pain, nausea or vomiting.   Discharge Exam: BP (!) 160/101 (BP Location: Left Arm)   Pulse 76   Temp 98 F (36.7 C) (Oral)   Resp 16   Ht 5\' 7"  (1.702 m)   Wt 77.1 kg Comment: stated  SpO2 100%   BMI 26.63 kg/m   General: Pt is alert, awake, not in acute distress Cardiovascular: RRR, S1/S2 +, no rubs, no gallops Respiratory: CTA bilaterally, no wheezing, no rhonchi Abdominal: Soft, NT, ND, bowel sounds + Extremities: no edema, no cyanosis    The results of significant diagnostics from this hospitalization (including imaging, microbiology, ancillary and laboratory) are listed below for reference.     Microbiology: Recent Results  (from the past 240 hour(s))  Resp Panel by RT-PCR (Flu A&B, Covid) Nasopharyngeal Swab     Status: None   Collection Time: 09/27/20  1:20 AM   Specimen: Nasopharyngeal Swab; Nasopharyngeal(NP) swabs in vial transport medium  Result Value Ref Range Status   SARS Coronavirus 2 by RT PCR NEGATIVE NEGATIVE Final    Comment: (NOTE) SARS-CoV-2 target nucleic acids are NOT DETECTED.  The SARS-CoV-2 RNA is generally detectable in upper respiratory specimens during the acute phase of infection. The lowest concentration of SARS-CoV-2 viral copies this assay can detect is 138 copies/mL. A negative result does not preclude SARS-Cov-2 infection and should not be used as the sole basis for treatment or other patient management decisions. A negative result may occur with  improper specimen collection/handling, submission of specimen other than nasopharyngeal swab, presence of viral mutation(s) within the areas targeted by this assay, and inadequate number of viral copies(<138 copies/mL). A negative result must be combined with clinical observations, patient history, and epidemiological information. The expected result is Negative.  Fact Sheet for Patients:  EntrepreneurPulse.com.au  Fact Sheet for Healthcare Providers:  IncredibleEmployment.be  This test is no t yet approved or cleared by the [place] FDA and  has been authorized for detection and/or diagnosis of SARS-CoV-2 by FDA under an Emergency Use Authorization (EUA). This EUA will remain  in effect (meaning this test can be used) for the duration of the COVID-19 declaration under Section 564(b)(1) of the Act, 21 U.S.C.section 360bbb-3(b)(1), unless the authorization is terminated  or revoked sooner.       Influenza A by PCR NEGATIVE NEGATIVE Final   Influenza B by PCR NEGATIVE NEGATIVE Final  Comment: (NOTE) The Xpert Xpress SARS-CoV-2/FLU/RSV plus assay is intended as an aid in the  diagnosis of influenza from Nasopharyngeal swab specimens and should not be used as a sole basis for treatment. Nasal washings and aspirates are unacceptable for Xpert Xpress SARS-CoV-2/FLU/RSV testing.  Fact Sheet for Patients: EntrepreneurPulse.com.au  Fact Sheet for Healthcare Providers: IncredibleEmployment.be  This test is not yet approved or cleared by the [place] FDA and has been authorized for detection and/or diagnosis of SARS-CoV-2 by FDA under an Emergency Use Authorization (EUA). This EUA will remain in effect (meaning this test can be used) for the duration of the COVID-19 declaration under Section 564(b)(1) of the Act, 21 U.S.C. section 360bbb-3(b)(1), unless the authorization is terminated or revoked.  Performed at [facility], [address], [place], [place] [address]      Labs: Basic Metabolic Panel: Recent Labs  Lab 09/26/20 1410  NA 138  K 3.8  CL 106  CO2 23  GLUCOSE 94  BUN 19  CREATININE 1.42*  CALCIUM 9.3   Liver Function Tests: Recent Labs  Lab 09/26/20 1410  AST 18  ALT 18  ALKPHOS 60  BILITOT 0.9  PROT 7.7  ALBUMIN 3.9   CBC: Recent Labs  Lab 09/26/20 1410  WBC 4.8  NEUTROABS 2.6  HGB 15.8  HCT 49.5  MCV 80.9  PLT 239   CBG: No results for input(s): GLUCAP in the last 168 hours. Hgb A1c Recent Labs    09/27/20 0111  HGBA1C 5.9*   Lipid Profile Recent Labs    09/27/20 0111  CHOL 237*  HDL 57  LDLCALC 165*  TRIG 76  CHOLHDL 4.2   Thyroid function studies No results for input(s): TSH, T4TOTAL, T3FREE, THYROIDAB in the last 72 hours.  Invalid input(s): FREET3 Urinalysis    Component Value Date/Time   COLORURINE YELLOW 09/26/2020 [place] 09/26/2020 2340   LABSPEC 1.019 09/26/2020 2340   PHURINE 6.0 09/26/2020 2340   GLUCOSEU NEGATIVE 09/26/2020 2340   HGBUR NEGATIVE 09/26/2020 2340   BILIRUBINUR NEGATIVE 09/26/2020 2340   KETONESUR  NEGATIVE 09/26/2020 2340   PROTEINUR 100 (A) 09/26/2020 2340   NITRITE NEGATIVE 09/26/2020 2340   LEUKOCYTESUR NEGATIVE 09/26/2020 2340    FURTHER DISCHARGE INSTRUCTIONS:   Get Medicines reviewed and adjusted: Please take all your medications with you for your next visit with your Primary MD   Laboratory/radiological data: Please request your Primary MD to go over all hospital tests and procedure/radiological results at the follow up, please ask your Primary MD to get all Hospital records sent to his/her office.   In some cases, they will be blood work, cultures and biopsy results pending at the time of your discharge. Please request that your primary care M.D. goes through all the records of your hospital data and follows up on these results.   Also Note the following: If you experience worsening of your admission symptoms, develop shortness of breath, life threatening emergency, suicidal or homicidal thoughts you must seek medical attention immediately by calling 911 or calling your MD immediately  if symptoms less severe.   You must read complete instructions/literature along with all the possible adverse reactions/side effects for all the Medicines you take and that have been prescribed to you. Take any new Medicines after you have completely understood and accpet all the possible adverse reactions/side effects.    Do not drive when taking Pain medications or sleeping medications (Benzodaizepines)   Do not take more than  prescribed Pain, Sleep and Anxiety Medications. It is not advisable to combine anxiety,sleep and pain medications without talking with your primary care practitioner   Special Instructions: If you have smoked or chewed Tobacco  in the last 2 yrs please stop smoking, stop any regular Alcohol  and or any Recreational drug use.   Wear Seat belts while driving.   Please note: You were cared for by a hospitalist during your hospital stay. Once you are discharged, your  primary care physician will handle any further medical issues. Please note that NO REFILLS for any discharge medications will be authorized once you are discharged, as it is imperative that you return to your primary care physician (or establish a relationship with a primary care physician if you do not have one) for your post hospital discharge needs so that they can reassess your need for medications and monitor your lab values.  Time coordinating discharge: 40 minutes  SIGNED:  Marzetta Board, MD, PhD 09/27/2020, 5:19 PM

## 2020-09-27 NOTE — ED Notes (Signed)
Dr. Sidney Ace contacted to make aware of patient's BP and that he passed swallow screen.

## 2020-09-27 NOTE — Evaluation (Signed)
Physical Therapy Evaluation & Discharge Patient Details Name: Carlos Hayes MRN: [id number] DOB: 14-Apr-1961 Today's Date: 09/27/2020   History of Present Illness  60 year old male with transient facial droop and slurred speech most consistent with transient ischemic attack. MRI showed small vessel ischemic changes affecting the pons, cerebellum, thalami, basal ganglia, and cerebral hemisphere white matter consistent with HTN small vessel disease;  there is a focus of edema associated with microhemorrhage in Rt cerebellum.   MRA showed Left sellar/cavernous 12mm mass most consistent with pituitary adenoma.  PMH includes essential HTN and gout.  Clinical Impression  PTA, patient lives with girlfriend and reports independence. 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 Patient demonstrates with short term memory deficits and unable to recall room number. Patient overall independent with mobility. No skilled PT needs required at this time. No PT follow up recommended at this time.     Follow Up Recommendations No PT follow up    Equipment Recommendations  None recommended by PT    Recommendations for Other Services       Precautions / Restrictions Precautions Precautions: None Restrictions Weight Bearing Restrictions: No      Mobility  Bed Mobility Overal bed mobility: Independent                  Transfers Overall transfer level: Independent                  Ambulation/Gait Ambulation/Gait assistance: Independent Gait Distance (Feet): 400 Feet Assistive device: None Gait Pattern/deviations: WFL(Within Functional Limits)        Stairs            Wheelchair Mobility    Modified Rankin (Stroke Patients Only)       Balance Overall balance assessment: No apparent balance deficits (not formally assessed)                                            Pertinent Vitals/Pain Pain Assessment: No/denies pain    Home Living Family/patient expects to be discharged to:: Private residence Living Arrangements: Spouse/significant other Available Help at Discharge: Family Type of Home: House Home Access: Level entry     Home Layout: One level Home Equipment: None      Prior Function Level of Independence: Independent         Comments: Pt reports to OT that he doesn't work, drives, and does yard work at his home.  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     Hand Dominance   Dominant Hand: Right    Extremity/Trunk Assessment   Upper Extremity Assessment Upper Extremity Assessment: Overall WFL for tasks assessed    Lower Extremity Assessment Lower Extremity Assessment: Overall WFL for tasks assessed    Cervical / Trunk Assessment Cervical / Trunk Assessment: Normal  Communication   Communication: No difficulties  Cognition Arousal/Alertness: Awake/alert Behavior During Therapy: WFL for tasks assessed/performed Overall Cognitive Status: No family/caregiver present to determine baseline cognitive functioning Area of Impairment: Memory;Awareness                     Memory: Decreased short-term memory     Awareness: Emergent   General Comments: Difficulty remembering room number spontaneously, however able to identify  with cueing and options provided      General Comments      Exercises     Assessment/Plan    PT Assessment Patent does not need any further PT services  PT Problem List         PT Treatment Interventions      PT Goals (Current goals can be found in the Care Plan section)  Acute Rehab PT Goals Patient Stated Goal: to go home today    Frequency     Barriers to discharge        Co-evaluation               AM-PAC PT "6 Clicks" Mobility  Outcome Measure Help needed turning from your back to your side while in a flat bed  without using bedrails?: None Help needed moving from lying on your back to sitting on the side of a flat bed without using bedrails?: None Help needed moving to and from a bed to a chair (including a wheelchair)?: None Help needed standing up from a chair using your arms (e.g., wheelchair or bedside chair)?: None Help needed to walk in hospital room?: None Help needed climbing 3-5 steps with a railing? : None 6 Click Score: 24    End of Session   Activity Tolerance: Patient tolerated treatment well Patient left: in chair;with call bell/phone within reach Nurse Communication: Mobility status PT Visit Diagnosis: Muscle weakness (generalized) (M62.81)    Time: 6301-6010 PT Time Calculation (min) (ACUTE ONLY): 9 min   Charges:   PT Evaluation $PT Eval Low Complexity: 1 Low          Carlos Hayes A. Carlos Hayes PT, DPT Acute Rehabilitation Services Pager [phone] Office [phone]   Carlos Hayes 09/27/2020, 12:53 PM

## 2020-09-27 NOTE — Progress Notes (Signed)
STROKE TEAM PROGRESS NOTE   SUBJECTIVE (INTERVAL HISTORY) No family is at the bedside.  Overall his condition is completely resolved. Pt sitting in chair comfortably, walking in and out of bathroom without difficulty. No focal deficit on exam. MRI showed scattered CMBs and possible recent right cerebellar small ICH. He had episode of slurry and facial droop, now resolved.    OBJECTIVE Temp:  [98 F (36.7 C)-98.9 F (37.2 C)] 98 F (36.7 C) (02/19 1212) Pulse Rate:  [65-90] 76 (02/19 1408) Cardiac Rhythm: Normal sinus rhythm;Heart block (02/19 0725) Resp:  [13-20] 16 (02/19 1212) BP: (154-221)/(87-131) 160/101 (02/19 1408) SpO2:  [93 %-100 %] 100 % (02/19 1212) Weight:  [77.1 kg] 77.1 kg (02/19 1044)  No results for input(s): GLUCAP in the last 168 hours. Recent Labs  Lab 09/26/20 1410  NA 138  K 3.8  CL 106  CO2 23  GLUCOSE 94  BUN 19  CREATININE 1.42*  CALCIUM 9.3   Recent Labs  Lab 09/26/20 1410  AST 18  ALT 18  ALKPHOS 60  BILITOT 0.9  PROT 7.7  ALBUMIN 3.9   Recent Labs  Lab 09/26/20 1410  WBC 4.8  NEUTROABS 2.6  HGB 15.8  HCT 49.5  MCV 80.9  PLT 239   No results for input(s): CKTOTAL, CKMB, CKMBINDEX, TROPONINI in the last 168 hours. No results for input(s): LABPROT, INR in the last 72 hours. Recent Labs    09/26/20 2340  COLORURINE YELLOW  LABSPEC 1.019  PHURINE 6.0  GLUCOSEU NEGATIVE  HGBUR NEGATIVE  BILIRUBINUR NEGATIVE  KETONESUR NEGATIVE  PROTEINUR 100*  NITRITE NEGATIVE  LEUKOCYTESUR NEGATIVE       Component Value Date/Time   CHOL 237 (H) 09/27/2020 0111   TRIG 76 09/27/2020 0111   HDL 57 09/27/2020 0111   CHOLHDL 4.2 09/27/2020 0111   VLDL 15 09/27/2020 0111   LDLCALC 165 (H) 09/27/2020 0111   Lab Results  Component Value Date   HGBA1C 5.9 (H) 09/27/2020      Component Value Date/Time   LABOPIA NONE DETECTED 09/27/2020 1143   COCAINSCRNUR NONE DETECTED 09/27/2020 1143   LABBENZ NONE DETECTED 09/27/2020 1143   AMPHETMU  NONE DETECTED 09/27/2020 1143   THCU NONE DETECTED 09/27/2020 1143   LABBARB NONE DETECTED 09/27/2020 1143    No results for input(s): ETH in the last 168 hours.  I have personally reviewed the radiological images below and agree with the radiology interpretations.  CT Head Wo Contrast  Result Date: 09/26/2020 CLINICAL DATA:  TIA EXAM: CT HEAD WITHOUT CONTRAST TECHNIQUE: Contiguous axial images were obtained from the base of the skull through the vertex without intravenous contrast. COMPARISON:  None. FINDINGS: Brain: No evidence of acute infarction, hemorrhage, hydrocephalus, extra-axial collection or mass lesion/mass effect. Mild periventricular white matter hypodensities consistent with sequela of chronic microvascular ischemic disease. Vascular: No hyperdense vessel or unexpected calcification. Skull: Normal. Negative for fracture or focal lesion. Sinuses/Orbits: No acute finding. Other: Bilateral ear cerumen. IMPRESSION: No acute intracranial abnormality. Electronically Signed   By: Valentino Saxon MD   On: 09/26/2020 15:33   MR ANGIO HEAD WO CONTRAST  Result Date: 09/27/2020 CLINICAL DATA:  Stroke follow-up. EXAM: MRA HEAD WITHOUT CONTRAST TECHNIQUE: Angiographic images of the Circle of Willis were obtained using MRA technique without intravenous contrast. COMPARISON:  Brain MRI from earlier today FINDINGS: Right larger than left vertebral artery. Symmetric size carotid arteries. No branch occlusion, beading, or aneurysm. Generalized atheromatous irregularity with mild bilateral M1 and M2 segment narrowing.  At a left M2 bifurcation there is a moderate stenosis. There is even greater atheromatous irregularity of bilateral proximal PCAs with high-grade bilateral P3/4 narrowings causing flow gaps. The left cavernous ICA is deviated laterally by a 9 mm nodule most consistent with pituitary adenoma. Negative for aneurysm. IMPRESSION: 1. Generalized atheromatous irregularity especially affecting  medium size vessels in the posterior circulation. No high-grade proximal stenosis. 2. Left sellar/cavernous 9 mm mass most consistent with pituitary adenoma. Electronically Signed   By: Monte Fantasia M.D.   On: 09/27/2020 05:20   MR ANGIO NECK WO CONTRAST  Result Date: 09/27/2020 CLINICAL DATA:  Stroke follow-up EXAM: MRA NECK WITHOUT CONTRAST TECHNIQUE: Angiographic images of the neck were obtained using MRA technique without intravenous contrast. COMPARISON:  None. FINDINGS: Antegrade flow in bilateral carotid and vertebral arteries. Normal size arch with 4 vessel branching. There is carotid and vertebral tortuosity without stenosis, ulceration, or beading. The left vertebral artery appears slightly irregular compared to the right on reformats, but this is likely from artifact and lumen size, the vessel appears smooth on source images. IMPRESSION: Negative neck MRA. Electronically Signed   By: Monte Fantasia M.D.   On: 09/27/2020 05:26   MR BRAIN WO CONTRAST  Result Date: 09/26/2020 CLINICAL DATA:  Acute presentation with slurred speech. Hand weakness. EXAM: MRI HEAD WITHOUT CONTRAST TECHNIQUE: Multiplanar, multiecho pulse sequences of the brain and surrounding structures were obtained without intravenous contrast. COMPARISON:  Head CT same day FINDINGS: Brain: Diffusion imaging does not show any acute in far shin/restricted diffusion. Molli Hazard shows a few old small vessel infarctions with a single focus of hemosiderin deposition in the ventral pons. Cerebral hemispheres show numerous foci of hemosiderin deposition, most appearing old and probably associated with old small vessel infarctions. Within the right cerebellum, there is a small focus of hemosiderin deposition with surrounding edema. This could be a recent hemorrhagic small vessel infarction. Cerebral hemispheres show moderate chronic small-vessel ischemic change of the thalami and hemispheric white matter, many associated with punctate hemosiderin  deposition. Old hemorrhagic lacunar infarction in the external capsule on the left. This type of appearance is presumed secondary to hypertensive disease. The differential diagnosis would be an early manifestation of amyloid angiopathy. No large vessel territory infarction. No mass lesion, hydrocephalus or extra-axial collection. Vascular: Major vessels at the base of the brain show flow. Skull and upper cervical spine: Negative Sinuses/Orbits: Clear/normal Other: None IMPRESSION: 1. Small-vessel ischemic changes affecting the pons, cerebellum, thalami, basal ganglia and cerebral hemispheric white matter. 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 Within the right cerebellum, there is a focus of edema associated with a microhemorrhage. This probably represents a recent hemorrhagic small vessel infarction. No other identifiable acute intracranial finding. Electronically Signed   By: Nelson Chimes M.D.   On: 09/26/2020 21:10     PHYSICAL EXAM  Temp:  [98 F (36.7 C)-98.9 F (37.2 C)] 98 F (36.7 C) (02/19 1212) Pulse Rate:  [65-90] 76 (02/19 1408) Resp:  [13-20] 16 (02/19 1212) BP: (154-221)/(87-131) 160/101 (02/19 1408) SpO2:  [93 %-100 %] 100 % (02/19 1212) Weight:  [77.1 kg] 77.1 kg (02/19 1044)  General - Well nourished, well developed, in no apparent distress.  Ophthalmologic - fundi not visualized due to noncooperation.  Cardiovascular - Regular rhythm and rate.  Mental Status -  Level of arousal and orientation to time, place, and person were intact. Language including expression, naming, repetition, comprehension was assessed and found  intact. Fund of Knowledge was assessed and was intact.  Cranial Nerves II - XII - II - Visual field intact OU. III, IV, VI - Extraocular movements intact. V - Facial sensation intact bilaterally. VII - Facial movement intact bilaterally. VIII - Hearing & vestibular  intact bilaterally. X - Palate elevates symmetrically. XI - Chin turning & shoulder shrug intact bilaterally. XII - Tongue protrusion intact.  Motor Strength - The patient's strength was normal in all extremities and pronator drift was absent.  Bulk was normal and fasciculations were absent.   Motor Tone - Muscle tone was assessed at the neck and appendages and was normal.  Reflexes - The patient's reflexes were symmetrical in all extremities and he had no pathological reflexes.  Sensory - Light touch, temperature/pinprick were assessed and were symmetrical.    Coordination - The patient had normal movements in the hands and feet with no ataxia or dysmetria.  Tremor was absent.  Gait and Station - deferred.   ASSESSMENT/PLAN Mr. Kristoph Sattler is a 60 y.o. male with history of HTN admitted for transient facial droop and slurry speech. No tPA given due to recent ICH and symptoms resolved.    TIA vs. Hypertensive encephalopathy  CT no acute finding  MRI  Scattered cerebral micro bleeds at b/l cerebellum, BG, and periventricular, most likely chronic HTN  MRA h/n unremarkable  2D Echo  pending  LDL 165  HgbA1c 5.9  UDS neg  lovenox for VTE prophylaxis  No antithrombotic prior to admission, now on aspirin 81 mg daily and clopidogrel 75 mg daily DAPT for 3 weeks and then ASA 81mg .  Patient counseled to be compliant with his antithrombotic medications  Ongoing aggressive stroke risk factor management  Therapy recommendations:  outpt OT  Disposition:  home  Recent cerebellar ICH  MRI showed recent right cerebellar ICH  CT no frank hematoma  Likely due to uncontrolled HTN  BP control with BP goal normotensive  Hypertensive urgency . Home meds - not compliant with meds  . Unstable on the high end . On amoldipineHCTZ and lisinopril  Long term BP goal normotensive  Hyperlipidemia  Home meds:  none   LDL 165, goal < 70  Now on lipitor 80  Continue statin  at discharge  Pituitary adenoma  MRI showed Left sellar/cavernous 9 mm mass most consistent with pituitary adenoma.  Discussed with Dr. Cruzita Lederer, pt will be referred to endocrinologist first  Other Stroke Risk Factors  Advanced age  Cigarette smoker, advised to stop smoking  ETOH use, recommend no more than 2 drinks per day  Other Active Problems  CKD stage IIIa - Cre 1.42  Hospital day # 0  Neurology will sign off. Please call with questions. Pt will follow up with stroke clinic NP at [facility] in about 4 weeks. Thanks for the consult.   Rosalin Hawking, MD PhD Stroke Neurology 09/27/2020 4:03 PM    To contact Stroke Continuity provider, please refer to [email or web address] After hours, contact General Neurology

## 2020-09-27 NOTE — Discharge Instructions (Signed)
Continue taking aspirin and clopidogrel (Plavix) for 21 days, then stop the Plavix and take aspirin alone  Please establish care with a primary care provider soon as possible and follow in 2 weeks Please establish care with cardiology and follow in 2 weeks Please establish care with endocrinology and follow in 2 weeks  Please get a complete blood count and chemistry panel checked by your Primary MD at your next visit, and again as instructed by your Primary MD. Please get your medications reviewed and adjusted by your Primary MD.  Please request your Primary MD to go over all Hospital Tests and Procedure/Radiological results at the follow up, please get all Hospital records sent to your Prim MD by signing hospital release before you go home.  In some cases, there will be blood work, cultures and biopsy results pending at the time of your discharge. Please request that your primary care M.D. goes through all the records of your hospital data and follows up on these results.  If you had Pneumonia of Lung problems at the Hospital: Please get a 2 view Chest X ray done in 6-8 weeks after hospital discharge or sooner if instructed by your Primary MD.  If you have Congestive Heart Failure: Please call your Cardiologist or Primary MD anytime you have any of the following symptoms:  1) 3 pound weight gain in 24 hours or 5 pounds in 1 week  2) shortness of breath, with or without a dry hacking cough  3) swelling in the hands, feet or stomach  4) if you have to sleep on extra pillows at night in order to breathe  Follow cardiac low salt diet and 1.5 lit/day fluid restriction.  If you have diabetes Accuchecks 4 times/day, Once in AM empty stomach and then before each meal. Log in all results and show them to your primary doctor at your next visit. If any glucose reading is under 80 or above 300 call your primary MD immediately.  If you have Seizure/Convulsions/Epilepsy: Please do not drive, operate  heavy machinery, participate in activities at heights or participate in high speed sports until you have seen by Primary MD or a Neurologist and advised to do so again. Per [facility] statutes, patients with seizures are not allowed to drive until they have been seizure-free for six months.  Use caution when using heavy equipment or power tools. Avoid working on ladders or at heights. Take showers instead of baths. Ensure the water temperature is not too high on the home water heater. Do not go swimming alone. Do not lock yourself in a room alone (i.e. bathroom). When caring for infants or small children, sit down when holding, feeding, or changing them to minimize risk of injury to the child in the event you have a seizure. Maintain good sleep hygiene. Avoid alcohol.   If you had Gastrointestinal Bleeding: Please ask your Primary MD to check a complete blood count within one week of discharge or at your next visit. Your endoscopic/colonoscopic biopsies that are pending at the time of discharge, will also need to followed by your Primary MD.  Get Medicines reviewed and adjusted. Please take all your medications with you for your next visit with your Primary MD  Please request your Primary MD to go over all hospital tests and procedure/radiological results at the follow up, please ask your Primary MD to get all Hospital records sent to his/her office.  If you experience worsening of your admission symptoms, develop shortness of breath,  life threatening emergency, suicidal or homicidal thoughts you must seek medical attention immediately by calling 911 or calling your MD immediately  if symptoms less severe.  You must read complete instructions/literature along with all the possible adverse reactions/side effects for all the Medicines you take and that have been prescribed to you. Take any new Medicines after you have completely understood and accpet all the possible adverse reactions/side  effects.   Do not drive or operate heavy machinery when taking Pain medications.   Do not take more than prescribed Pain, Sleep and Anxiety Medications  Special Instructions: If you have smoked or chewed Tobacco  in the last 2 yrs please stop smoking, stop any regular Alcohol  and or any Recreational drug use.  Wear Seat belts while driving.  Please note You were cared for by a hospitalist during your hospital stay. If you have any questions about your discharge medications or the care you received while you were in the hospital after you are discharged, you can call the unit and asked to speak with the hospitalist on call if the hospitalist that took care of you is not available. Once you are discharged, your primary care physician will handle any further medical issues. Please note that NO REFILLS for any discharge medications will be authorized once you are discharged, as it is imperative that you return to your primary care physician (or establish a relationship with a primary care physician if you do not have one) for your aftercare needs so that they can reassess your need for medications and monitor your lab values.  You can reach the hospitalist office at phone [phone] or fax [phone]   If you do not have a primary care physician, you can call [phone] for a physician referral.  Activity: As tolerated with Full fall precautions use walker/cane & assistance as needed    Diet: low sodium  Disposition Home

## 2020-09-27 NOTE — Progress Notes (Signed)
Nsg Discharge Note  Admit Date:  09/26/2020 Discharge date: 09/27/2020   Iyan Flett to be D/C'd Home per MD order.  AVS completed.  Copy for chart, and copy for patient signed, and dated. Patient/caregiver able to verbalize understanding.  Discharge Medication: Allergies as of 09/27/2020   No Known Allergies     Medication List    TAKE these medications   amLODipine 10 MG tablet Commonly known as: NORVASC Take 1 tablet (10 mg total) by mouth daily.   APPLE CIDER VINEGAR PO Take 1 capsule by mouth daily.   aspirin 81 MG EC tablet Take 1 tablet (81 mg total) by mouth daily. Swallow whole. Start taking on: September 28, 2020   atorvastatin 80 MG tablet Commonly known as: LIPITOR Take 1 tablet (80 mg total) by mouth daily. Start taking on: September 28, 2020   clopidogrel 75 MG tablet Commonly known as: PLAVIX Take 1 tablet (75 mg total) by mouth daily for 21 days. Start taking on: September 28, 2020   colchicine 0.6 MG tablet Take 1 tablet (0.6 mg total) by mouth daily as needed (gout). What changed:   when to take this  reasons to take this   hydrochlorothiazide 12.5 MG capsule Commonly known as: Microzide Take 2 capsules (25 mg total) by mouth daily.   lisinopril 20 MG tablet Commonly known as: ZESTRIL Take 1 tablet (20 mg total) by mouth daily. Start taking on: September 28, 2020   metoprolol succinate 25 MG 24 hr tablet Commonly known as: TOPROL-XL Take 0.5 tablets (12.5 mg total) by mouth daily. Start taking on: September 28, 2020   naproxen sodium 220 MG tablet Commonly known as: ALEVE Take 440 mg by mouth daily as needed (headache/pain).   thiamine 50 MG tablet Commonly known as: VITAMIN B-1 Take 50 mg by mouth daily.       Discharge Assessment: Vitals:   09/27/20 1212 09/27/20 1408  BP: (!) 172/95 (!) 160/101  Pulse: 70 76  Resp: 16   Temp: 98 F (36.7 C)   SpO2: 100%    Skin clean, dry and intact without evidence of skin break down,  no evidence of skin tears noted. IV catheter discontinued intact. Site without signs and symptoms of complications - no redness or edema noted at insertion site, patient denies c/o pain - only slight tenderness at site.  Dressing with slight pressure applied.  D/c Instructions-Education: Discharge instructions given to patient/family with verbalized understanding. D/c education completed with patient/family including follow up instructions, medication list, d/c activities limitations if indicated, with other d/c instructions as indicated by MD - patient able to verbalize understanding, all questions fully answered. Patient instructed to return to ED, call 911, or call MD for any changes in condition.  Patient escorted via [place], and D/C home via private auto.  Erasmo Leventhal, RN 09/27/2020 5:47 PM

## 2020-09-27 NOTE — Progress Notes (Addendum)
Tele notified writer that patient had 5 beat run of V-tach and then followed by wide QRS and short pause. Episode lasted about 18 sec, then patient returned to NSR. Patient was asymptomatic. Paged MD Cruzita Lederer.

## 2020-09-27 NOTE — ED Notes (Signed)
Report given to [place], RN on [facility]. She would like BP rechecked prior to accepting patient due to appropriateness of patient for unit.

## 2020-10-31 ENCOUNTER — Other Ambulatory Visit: Payer: Self-pay

## 2020-10-31 ENCOUNTER — Encounter: Payer: Self-pay | Admitting: Internal Medicine

## 2020-10-31 ENCOUNTER — Ambulatory Visit: Payer: 59 | Admitting: Internal Medicine

## 2020-10-31 VITALS — BP 160/80 | HR 82 | Ht 67.0 in | Wt 230.0 lb

## 2020-10-31 DIAGNOSIS — I351 Nonrheumatic aortic (valve) insufficiency: Secondary | ICD-10-CM | POA: Diagnosis not present

## 2020-10-31 DIAGNOSIS — G459 Transient cerebral ischemic attack, unspecified: Secondary | ICD-10-CM

## 2020-10-31 DIAGNOSIS — I10 Essential (primary) hypertension: Secondary | ICD-10-CM

## 2020-10-31 MED ORDER — EZETIMIBE 10 MG PO TABS: 10.0000 mg | ORAL_TABLET | Freq: Every day | ORAL | 3 refills | Status: DC

## 2020-10-31 MED ORDER — HYDROCHLOROTHIAZIDE 25 MG PO TABS: 25.0000 mg | ORAL_TABLET | Freq: Every day | ORAL | 3 refills | Status: DC

## 2020-10-31 NOTE — Progress Notes (Signed)
Cardiology Office Note:    Date:  10/31/2020   ID:  Carlos Hayes, DOB September 23, 1960, MRN [id number]  PCP:  Patient, No Pcp Per   [facility]  Cardiologist:  Werner Lean, MD  Advanced Practice Provider:  No care team member to display Electrophysiologist:  None       CC: Get fixed up Consulted for the evaluation of TIA  at the behest of Patient, No Pcp Per  History of Present Illness:    Carlos Hayes is a 60 y.o. male with a hx of HTN and TIA 09/27/20 who presents for evaluation 10/31/20.  Patient notes that he is feeling OK now .   Had ED visit 09/27/20: had sudden onset word slurring.  GF called EMS.  Had BP in the SBP 200s.  Found to have AKI.  Sx completely resolved.  Had DAPT or 3 weeks and then started on ASA.  Started atorvastatin but this has been stopped for lip swelling.  Through Echo assessment found to have moderate to severe aortic insufficiency.     Has had no chest pain, chest pressure, chest tightness, chest stinging . No shortness of breath, DOE .  No PND or orthopnea.  No bendopnea, weight gain, leg swelling , or abdominal swelling.  No syncope or near syncope . Notes  no palpitations or funny heart beats.     Notes that he hasn't really had medical care since the COVID-19 pandemic.  Doesn't remember having BP or HLD issues in the past.  Ambulatory BP 180 at [facility] (uses there cuff).     Past Medical History:  Diagnosis Date  . Gout   . Hypertension     No past surgical history on file.  Current Medications: Current Meds  Medication Sig  . amLODipine (NORVASC) 10 MG tablet Take 1 tablet (10 mg total) by mouth daily.  . APPLE CIDER VINEGAR PO Take 1 capsule by mouth daily.  Marland Kitchen aspirin EC 81 MG EC tablet Take 1 tablet (81 mg total) by mouth daily. Swallow whole.  . colchicine 0.6 MG tablet Take 1 tablet (0.6 mg total) by mouth daily as needed (gout).  Marland Kitchen ezetimibe (ZETIA) 10 MG tablet Take 1 tablet (10 mg total) by  mouth daily.  . hydrochlorothiazide (HYDRODIURIL) 25 MG tablet Take 1 tablet (25 mg total) by mouth daily.  Marland Kitchen lisinopril (ZESTRIL) 20 MG tablet Take 1 tablet (20 mg total) by mouth daily.  . metoprolol succinate (TOPROL-XL) 25 MG 24 hr tablet Take 0.5 tablets (12.5 mg total) by mouth daily.  . naproxen sodium (ALEVE) 220 MG tablet Take 440 mg by mouth daily as needed (headache/pain).  Marland Kitchen thiamine (VITAMIN B-1) 50 MG tablet Take 50 mg by mouth daily.  . [DISCONTINUED] atorvastatin (LIPITOR) 80 MG tablet Take 1 tablet (80 mg total) by mouth daily.  . [DISCONTINUED] hydrochlorothiazide (MICROZIDE) 12.5 MG capsule Take 2 capsules (25 mg total) by mouth daily.     Allergies:   Lipitor [atorvastatin]   Social History   Socioeconomic History  . Marital status: Single    Spouse name: Not on file  . Number of children: Not on file  . Years of education: Not on file  . Highest education level: Not on file  Occupational History  . Not on file  Tobacco Use  . Smoking status: Never Smoker  . Smokeless tobacco: Never Used  Vaping Use  . Vaping Use: Never used  Substance and Sexual Activity  . Alcohol use: Yes  .  Drug use: No  . Sexual activity: Not on file  Other Topics Concern  . Not on file  Social History Narrative  . Not on file   Social Determinants of Health   Financial Resource Strain: Not on file  Food Insecurity: Not on file  Transportation Needs: Not on file  Physical Activity: Not on file  Stress: Not on file  Social Connections: Not on file     Family History: History of coronary artery disease notable for no members. History of heart failure notable for no members. History of arrhythmia notable for no members.  ROS:   Please see the history of present illness.     All other systems reviewed and are negative.  EKGs/Labs/Other Studies Reviewed:    The following studies were reviewed today:   EKG:   09/26/20: SR 1st HN rate 82 LVH  Transthoracic  Echocardiogram: Date: 09/27/20 Results: Moderate to  severe AI 1. Left ventricular ejection fraction, by estimation, is 55 to 60%. The  left ventricle has normal function. Left ventricular endocardial border  not optimally defined to evaluate regional wall motion. There is moderate  left ventricular hypertrophy. Left  ventricular diastolic parameters are consistent with Grade II diastolic  dysfunction (pseudonormalization).  2. Right ventricular systolic function is normal. The right ventricular  size is normal. Tricuspid regurgitation signal is inadequate for assessing  PA pressure.  3. Left atrial size was moderately dilated.  4. Right atrial size was mild to moderately dilated.  5. The mitral valve is normal in structure. Trivial mitral valve  regurgitation. No evidence of mitral stenosis.  6. The aortic valve is abnormal. Aortic valve regurgitation is moderate  to moderate-severe. No aortic stenosis is present. Aortic regurgitation  PHT measures 364 msec. Note BP at time of study is 187/94 mmHg. AV appears  mildly degenerative, no definite  leaflet perforation noted. Jet of AR is eccentric and directed toward  anterior mitral leaflet. Quantitation challenging with eccentric jet.  7. The inferior vena cava is normal in size with greater than 50%  respiratory variability, suggesting right atrial pressure of 3 mmHg.  8. Cannot exclude atrial level shunt.     Recent Labs: 09/26/2020: ALT 18; BUN 19; Creatinine, Ser 1.42; Hemoglobin 15.8; Platelets 239; Potassium 3.8; Sodium 138  Recent Lipid Panel    Component Value Date/Time   CHOL 237 (H) 09/27/2020 0111   TRIG 76 09/27/2020 0111   HDL 57 09/27/2020 0111   CHOLHDL 4.2 09/27/2020 0111   VLDL 15 09/27/2020 0111   LDLCALC 165 (H) 09/27/2020 0111     Risk Assessment/Calculations:     N/A  Physical Exam:    VS:  BP (!) 160/80   Pulse 82   Ht 5\' 7"  (1.702 m)   Wt 230 lb (104.3 kg)   SpO2 99%   BMI 36.02 kg/m      Wt Readings from Last 3 Encounters:  10/31/20 230 lb (104.3 kg)  09/27/20 170 lb (77.1 kg)  03/04/20 170 lb (77.1 kg)    GEN:  Well nourished, well developed in no acute distress HEENT: Normal negative Demusset's sign NECK: No JVD; No carotid bruits LYMPHATICS: No lymphadenopathy CARDIAC: RRR, notable IIVI holodiastolic murmur, no rubs, gallops 3+ pulses bilaterally  RESPIRATORY:  Clear to auscultation without rales, wheezing or rhonchi  ABDOMEN: Soft, non-tender, non-distended MUSCULOSKELETAL:  No edema; No deformity  SKIN: Warm and dry NEUROLOGIC:  Alert and oriented x 3 PSYCHIATRIC:  Normal affect   ASSESSMENT:  1. Nonrheumatic aortic valve insufficiency   2. Transient ischemic attack   3. Primary hypertension    PLAN:    In order of problems listed above:  Moderate Aortic Insufficiency HTN TIA vs HTN emergency - will increase HCTZ to 25 mg PO Daily and check BMP in one week - will continue other HTN agents - will continue amb BP monitoring - will see in HTN clinic - continue ASA - will get echo in June; if no improvement with better loading conditions will pursue TEE   Medication Adjustments/Labs and Tests Ordered: Current medicines are reviewed at length with the patient today.  Concerns regarding medicines are outlined above.  Orders Placed This Encounter  Procedures  . Basic metabolic panel  . AMB Referral to [facility]  . ECHOCARDIOGRAM COMPLETE   Meds ordered this encounter  Medications  . ezetimibe (ZETIA) 10 MG tablet    Sig: Take 1 tablet (10 mg total) by mouth daily.    Dispense:  90 tablet    Refill:  3  . hydrochlorothiazide (HYDRODIURIL) 25 MG tablet    Sig: Take 1 tablet (25 mg total) by mouth daily.    Dispense:  90 tablet    Refill:  3    Patient Instructions  Medication Instructions:  Your physician has recommended you make the following change in your medication:  START: zetia 10 mg by mouth daily  INCREASE: HCTZ  (hydrochlorothizade) to 25 mg by mouth daily  *If you need a refill on your cardiac medications before your next appointment, please call your pharmacy*   Lab Work: IN 7-10 days: BMP If you have labs (blood work) drawn today and your tests are completely normal, you will receive your results only by: Marland Kitchen MyChart Message (if you have MyChart) OR . A paper copy in the mail If you have any lab test that is abnormal or we need to change your treatment, we will call you to review the results.   Testing/Procedures: Your physician has requested that you have an echocardiogram in [place]. Echocardiography is a painless test that uses sound waves to create images of your heart. It provides your doctor with information about the size and shape of your heart and how well your heart's chambers and valves are working. This procedure takes approximately one hour. There are no restrictions for this procedure.  Your physician has referred you to see the Hypertension Clinic to help manage high blood pressure.     Follow-Up: At [facility], you and your health needs are our priority.  As part of our continuing mission to provide you with exceptional heart care, we have created designated Provider Care Teams.  These Care Teams include your primary Cardiologist (physician) and Advanced Practice Providers (APPs -  Physician Assistants and Nurse Practitioners) who all work together to provide you with the care you need, when you need it.  We recommend signing up for the patient portal called "MyChart".  Sign up information is provided on this After Visit Summary.  MyChart is used to connect with patients for Virtual Visits (Telemedicine).  Patients are able to view lab/test results, encounter notes, upcoming appointments, etc.  Non-urgent messages can be sent to your provider as well.   To learn more about what you can do with MyChart, go to [email or web address].    Your next appointment:   4 month(s)  The  format for your next appointment:   In Person  Provider:   You may see Rudean Haskell, MD or  one of the following Advanced Practice Providers on your designated Care Team:    Melina Copa, PA-C  Ermalinda Barrios, PA-C        Signed, Werner Lean, MD  10/31/2020 4:53 PM    [place]

## 2020-10-31 NOTE — Patient Instructions (Signed)
Medication Instructions:  Your physician has recommended you make the following change in your medication:  START: zetia 10 mg by mouth daily  INCREASE: HCTZ (hydrochlorothizade) to 25 mg by mouth daily  *If you need a refill on your cardiac medications before your next appointment, please call your pharmacy*   Lab Work: IN 7-10 days: BMP If you have labs (blood work) drawn today and your tests are completely normal, you will receive your results only by: Marland Kitchen MyChart Message (if you have MyChart) OR . A paper copy in the mail If you have any lab test that is abnormal or we need to change your treatment, we will call you to review the results.   Testing/Procedures: Your physician has requested that you have an echocardiogram in [place]. Echocardiography is a painless test that uses sound waves to create images of your heart. It provides your doctor with information about the size and shape of your heart and how well your heart's chambers and valves are working. This procedure takes approximately one hour. There are no restrictions for this procedure.  Your physician has referred you to see the Hypertension Clinic to help manage high blood pressure.     Follow-Up: At [facility], you and your health needs are our priority.  As part of our continuing mission to provide you with exceptional heart care, we have created designated Provider Care Teams.  These Care Teams include your primary Cardiologist (physician) and Advanced Practice Providers (APPs -  Physician Assistants and Nurse Practitioners) who all work together to provide you with the care you need, when you need it.  We recommend signing up for the patient portal called "MyChart".  Sign up information is provided on this After Visit Summary.  MyChart is used to connect with patients for Virtual Visits (Telemedicine).  Patients are able to view lab/test results, encounter notes, upcoming appointments, etc.  Non-urgent messages can be  sent to your provider as well.   To learn more about what you can do with MyChart, go to [email or web address].    Your next appointment:   4 month(s)  The format for your next appointment:   In Person  Provider:   You may see Rudean Haskell, MD or one of the following Advanced Practice Providers on your designated Care Team:    Melina Copa, PA-C  Ermalinda Barrios, PA-C

## 2020-11-07 ENCOUNTER — Other Ambulatory Visit: Payer: Self-pay

## 2020-11-07 ENCOUNTER — Other Ambulatory Visit: Payer: Self-pay | Admitting: Internal Medicine

## 2020-11-07 ENCOUNTER — Other Ambulatory Visit: Payer: 59 | Admitting: *Deleted

## 2020-11-10 LAB — BASIC METABOLIC PANEL
BUN/Creatinine Ratio: 13 (ref 9–20)
BUN: 23 mg/dL (ref 6–24)
CO2: 23 mmol/L (ref 20–29)
Calcium: 9.7 mg/dL (ref 8.7–10.2)
Chloride: 98 mmol/L (ref 96–106)
Creatinine, Ser: 1.74 mg/dL — ABNORMAL HIGH (ref 0.76–1.27)
Glucose: 108 mg/dL — ABNORMAL HIGH (ref 65–99)
Potassium: 3.6 mmol/L (ref 3.5–5.2)
Sodium: 139 mmol/L (ref 134–144)
eGFR: 45 mL/min/{1.73_m2} — ABNORMAL LOW (ref >59)

## 2020-11-18 ENCOUNTER — Ambulatory Visit: Payer: 59

## 2020-11-18 NOTE — Progress Notes (Deleted)
Patient ID: Carlos Hayes                 DOB: March 10, 1961                      MRN: [id number]     HPI: Carlos Hayes is a 60 y.o. male referred by Dr. Gasper Sells to HTN clinic. PMH is significant for HTN, TIA, moderate aortic valve insufficiency and gout. Patient was seen by MD on 10/31/20. Blood pressure was 160/80. Patient reported his BP is 119 systolic at [facility]. HCTZ was increased to 25mg  daily. Repeat BMP showed a bump in scr from 1.42 to 1.74. Concern was that this was from very elevated BP. Attempts were made to contact patient but RN was unsuccessful.  Needs BMP   Compliance? Med rec Diet  Exercise NSAIDs,decongestants Social hx Swap out HCTZ for chlorthalidone? Increase lisinopril? Add spiro? Add hydralazine  Home BP/HR?  Current HTN meds: amlodipine 10mg  daily, HCTZ 25mg  daily, lisinopril 20mg  daily, metoprolol succinate 12.5mg  daily Previously tried:  BP goal: <130/80  Family History: History of coronary artery disease notable for no members. History of heart failure notable for no members. History of arrhythmia notable for no members.  Social History:   Diet:   Exercise:   Home BP readings:   Wt Readings from Last 3 Encounters:  10/31/20 230 lb (104.3 kg)  09/27/20 170 lb (77.1 kg)  03/04/20 170 lb (77.1 kg)   BP Readings from Last 3 Encounters:  10/31/20 (!) 160/80  09/27/20 (!) 160/101  03/05/20 (!) 217/129   Pulse Readings from Last 3 Encounters:  10/31/20 82  09/27/20 76  03/05/20 87    Renal function: CrCl cannot be calculated (Unknown ideal weight.).  Past Medical History:  Diagnosis Date  . Gout   . Hypertension     Current Outpatient Medications on File Prior to Visit  Medication Sig Dispense Refill  . amLODipine (NORVASC) 10 MG tablet Take 1 tablet (10 mg total) by mouth daily. 30 tablet 1  . APPLE CIDER VINEGAR PO Take 1 capsule by mouth daily.    Marland Kitchen aspirin EC 81 MG EC tablet Take 1 tablet (81 mg total) by mouth  daily. Swallow whole. 30 tablet 1  . colchicine 0.6 MG tablet Take 1 tablet (0.6 mg total) by mouth daily as needed (gout). 30 tablet 0  . ezetimibe (ZETIA) 10 MG tablet Take 1 tablet (10 mg total) by mouth daily. 90 tablet 3  . hydrochlorothiazide (HYDRODIURIL) 25 MG tablet Take 1 tablet (25 mg total) by mouth daily. 90 tablet 3  . lisinopril (ZESTRIL) 20 MG tablet Take 1 tablet (20 mg total) by mouth daily. 30 tablet 1  . metoprolol succinate (TOPROL-XL) 25 MG 24 hr tablet Take 0.5 tablets (12.5 mg total) by mouth daily. 30 tablet 0  . naproxen sodium (ALEVE) 220 MG tablet Take 440 mg by mouth daily as needed (headache/pain).    Marland Kitchen thiamine (VITAMIN B-1) 50 MG tablet Take 50 mg by mouth daily.     No current facility-administered medications on file prior to visit.    Allergies  Allergen Reactions  . Lipitor [Atorvastatin] Swelling    Causes lip to swell    There were no vitals taken for this visit.   Assessment/Plan:  1. Hypertension -    Thank you  Ramond Dial, Pharm.D, BCPS, CPP [place]  [address] [place], [place] [address]  Phone: [phone]; Fax: [phone]    

## 2020-11-20 ENCOUNTER — Telehealth: Payer: Self-pay

## 2020-11-20 NOTE — Telephone Encounter (Signed)
Unable to r/s missed appt no vm

## 2020-12-03 ENCOUNTER — Encounter: Payer: Self-pay | Admitting: Internal Medicine

## 2020-12-13 ENCOUNTER — Ambulatory Visit (HOSPITAL_COMMUNITY)
Admission: EM | Admit: 2020-12-13 | Discharge: 2020-12-13 | Disposition: A | Discharge status: Home / Self Care | Payer: 59 | Attending: Emergency Medicine | Admitting: Emergency Medicine

## 2020-12-13 ENCOUNTER — Other Ambulatory Visit: Payer: Self-pay

## 2020-12-13 ENCOUNTER — Encounter (HOSPITAL_COMMUNITY): Payer: Self-pay

## 2020-12-13 DIAGNOSIS — Z760 Encounter for issue of repeat prescription: Secondary | ICD-10-CM | POA: Diagnosis not present

## 2020-12-13 MED ORDER — METOPROLOL SUCCINATE ER 25 MG PO TB24: 12.5000 mg | ORAL_TABLET | Freq: Every day | ORAL | 0 refills | Status: DC

## 2020-12-13 MED ORDER — EZETIMIBE 10 MG PO TABS: 10.0000 mg | ORAL_TABLET | Freq: Every day | ORAL | 3 refills | Status: DC

## 2020-12-13 MED ORDER — AMLODIPINE BESYLATE 10 MG PO TABS: 10.0000 mg | ORAL_TABLET | Freq: Every day | ORAL | 1 refills | Status: DC

## 2020-12-13 MED ORDER — ASPIRIN 81 MG PO TBEC: 81.0000 mg | DELAYED_RELEASE_TABLET | Freq: Every day | ORAL | 1 refills | Status: DC

## 2020-12-13 NOTE — ED Provider Notes (Signed)
[facility]   [id number] 12/13/20 Arrival Time: [place]   Chief Complaint  Patient presents with  . Medication Refill      SUBJECTIVE: History from: patient.  Carlos Hayes is a 60 y.o. male presented to the urgent care for medication refill.  Patient is here to have Norvasc 10 mg, atorvastatin 80 mg, metoprolol 25 mg and Ezetimibe need 10 mg refilled.  Report he does not have a PCP.  Denies chills, fever, nausea, vomiting, diarrhea, chest pain, shortness of breath.     ROS: As per HPI.  All other pertinent ROS negative.     Past Medical History:  Diagnosis Date  . Gout   . Hypertension    History reviewed. No pertinent surgical history. Allergies  Allergen Reactions  . Lipitor [Atorvastatin] Swelling    Causes lip to swell   No current facility-administered medications on file prior to encounter.   Current Outpatient Medications on File Prior to Encounter  Medication Sig Dispense Refill  . APPLE CIDER VINEGAR PO Take 1 capsule by mouth daily.    . colchicine 0.6 MG tablet Take 1 tablet (0.6 mg total) by mouth daily as needed (gout). 30 tablet 0  . hydrochlorothiazide (HYDRODIURIL) 25 MG tablet Take 1 tablet (25 mg total) by mouth daily. 90 tablet 3  . lisinopril (ZESTRIL) 20 MG tablet Take 1 tablet (20 mg total) by mouth daily. 30 tablet 1  . naproxen sodium (ALEVE) 220 MG tablet Take 440 mg by mouth daily as needed (headache/pain).    Marland Kitchen thiamine (VITAMIN B-1) 50 MG tablet Take 50 mg by mouth daily.     Social History   Socioeconomic History  . Marital status: Single    Spouse name: Not on file  . Number of children: Not on file  . Years of education: Not on file  . Highest education level: Not on file  Occupational History  . Not on file  Tobacco Use  . Smoking status: Never Smoker  . Smokeless tobacco: Never Used  Vaping Use  . Vaping Use: Never used  Substance and Sexual Activity  . Alcohol use: Yes  . Drug use: No  . Sexual activity: Not on  file  Other Topics Concern  . Not on file  Social History Narrative  . Not on file   Social Determinants of Health   Financial Resource Strain: Not on file  Food Insecurity: Not on file  Transportation Needs: Not on file  Physical Activity: Not on file  Stress: Not on file  Social Connections: Not on file  Intimate Partner Violence: Not on file   History reviewed. No pertinent family history.  OBJECTIVE:  Vitals:   12/13/20 1321  BP: (!) 182/95  Pulse: 70  Resp: 20  Temp: 98.4 F (36.9 C)  TempSrc: Oral  SpO2: 98%     Physical Exam Vitals and nursing note reviewed.  Constitutional:      General: He is not in acute distress.    Appearance: Normal appearance. He is normal weight. He is not ill-appearing, toxic-appearing or diaphoretic.  Cardiovascular:     Rate and Rhythm: Normal rate and regular rhythm.     Pulses: Normal pulses.     Heart sounds: Normal heart sounds. No murmur heard. No friction rub. No gallop.   Pulmonary:     Effort: Pulmonary effort is normal. No respiratory distress.     Breath sounds: Normal breath sounds. No stridor. No wheezing, rhonchi or rales.  Chest:  Chest wall: No tenderness.  Neurological:     Mental Status: He is alert and oriented to person, place, and time.      LABS:  No results found for this or any previous visit (from the past 24 hour(s)).   ASSESSMENT & PLAN:  1. Encounter for medication refill     Meds ordered this encounter  Medications  . amLODipine (NORVASC) 10 MG tablet    Sig: Take 1 tablet (10 mg total) by mouth daily.    Dispense:  30 tablet    Refill:  1  . aspirin 81 MG EC tablet    Sig: Take 1 tablet (81 mg total) by mouth daily. Swallow whole.    Dispense:  30 tablet    Refill:  1  . ezetimibe (ZETIA) 10 MG tablet    Sig: Take 1 tablet (10 mg total) by mouth daily.    Dispense:  90 tablet    Refill:  3  . metoprolol succinate (TOPROL-XL) 25 MG 24 hr tablet    Sig: Take 0.5 tablets (12.5  mg total) by mouth daily.    Dispense:  30 tablet    Refill:  0   Patient is stable at discharge.  Medication was refilled except atorvastatin  as he is allergic to it.  Discharge instructions  Follow-up and establish care with PCP. PCP resource was provided Medication was refilled. Call or go to the ED if you have any new or worsening symptoms such as fever, worsening cough, shortness of breath, chest tightness, chest pain, turning blue, changes in mental status, etc...   Reviewed expectations re: course of current medical issues. Questions answered. Outlined signs and symptoms indicating need for more acute intervention. Patient verbalized understanding. After Visit Summary given.         Emerson Monte, [place] 12/13/20 1446

## 2020-12-13 NOTE — Discharge Instructions (Signed)
Follow-up and establish care with PCP. PCP resource was provided Medication was refilled. Call or go to the ED if you have any new or worsening symptoms such as fever, worsening cough, shortness of breath, chest tightness, chest pain, turning blue, changes in mental status, etc..Marland Kitchen

## 2020-12-13 NOTE — ED Triage Notes (Signed)
Pt is here today for a refill on Aspirin 81 mg, Amlodipine Besylate 10 mg, Atorvastatin 80 mg, Ezetimibe 10 mg and Metoprolol 25 mg.

## 2021-01-29 ENCOUNTER — Ambulatory Visit: Payer: 59 | Attending: Physician Assistant | Admitting: Physician Assistant

## 2021-01-29 ENCOUNTER — Other Ambulatory Visit: Payer: Self-pay

## 2021-01-29 ENCOUNTER — Encounter: Payer: Self-pay | Admitting: Physician Assistant

## 2021-01-29 VITALS — BP 182/81 | HR 71 | Ht 67.0 in | Wt 229.0 lb

## 2021-01-29 DIAGNOSIS — G459 Transient cerebral ischemic attack, unspecified: Secondary | ICD-10-CM

## 2021-01-29 DIAGNOSIS — R7303 Prediabetes: Secondary | ICD-10-CM

## 2021-01-29 DIAGNOSIS — N289 Disorder of kidney and ureter, unspecified: Secondary | ICD-10-CM | POA: Diagnosis not present

## 2021-01-29 DIAGNOSIS — E785 Hyperlipidemia, unspecified: Secondary | ICD-10-CM

## 2021-01-29 DIAGNOSIS — M109 Gout, unspecified: Secondary | ICD-10-CM

## 2021-01-29 DIAGNOSIS — Z09 Encounter for follow-up examination after completed treatment for conditions other than malignant neoplasm: Secondary | ICD-10-CM

## 2021-01-29 DIAGNOSIS — I10 Essential (primary) hypertension: Secondary | ICD-10-CM | POA: Diagnosis not present

## 2021-01-29 MED ORDER — METOPROLOL SUCCINATE ER 25 MG PO TB24: 12.5000 mg | ORAL_TABLET | Freq: Every day | ORAL | 3 refills | Status: DC

## 2021-01-29 MED ORDER — ASPIRIN 81 MG PO TBEC
81.0000 mg | DELAYED_RELEASE_TABLET | Freq: Every day | ORAL | 3 refills | Status: DC
Start: 2021-01-29 — End: 2021-04-27

## 2021-01-29 MED ORDER — COLCHICINE 0.6 MG PO TABS
0.6000 mg | ORAL_TABLET | Freq: Every day | ORAL | 2 refills | Status: DC | PRN
Start: 2021-01-29 — End: 2021-04-08

## 2021-01-29 MED ORDER — AMLODIPINE BESYLATE 10 MG PO TABS
10.0000 mg | ORAL_TABLET | Freq: Every day | ORAL | 3 refills | Status: DC
Start: 2021-01-29 — End: 2021-06-03

## 2021-01-29 MED ORDER — EZETIMIBE 10 MG PO TABS: 10.0000 mg | ORAL_TABLET | Freq: Every day | ORAL | 3 refills | Status: DC

## 2021-01-29 MED ORDER — HYDROCHLOROTHIAZIDE 25 MG PO TABS: 25.0000 mg | ORAL_TABLET | Freq: Every day | ORAL | 3 refills | Status: DC

## 2021-01-29 NOTE — Progress Notes (Signed)
Patient ID: Carlos Hayes, male   DOB: 1960/12/02, 60 y.o.   MRN: [id number]    Berish Bohman, is a 60 y.o. male  YDX:[id number]  HMC:[id number]  DOB - [birth year]  Subjective:  Chief Complaint and HPI: [name] is a 60 y.o. male here today to establish care and for a follow up visit After being seen at [facility] for med RF 12/13/2020  no complaints or new issues today.  Lisinopril is being held due to abnormal kidney function.  No CP.  No SOB.  Patient did NOT take meds today as he is out.    From U/C note: From A/P:  amLODipine (NORVASC) 10 MG tablet      Sig: Take 1 tablet (10 mg total) by mouth daily.      Dispense:  30 tablet      Refill:  1   aspirin 81 MG EC tablet      Sig: Take 1 tablet (81 mg total) by mouth daily. Swallow whole.      Dispense:  30 tablet      Refill:  1   ezetimibe (ZETIA) 10 MG tablet      Sig: Take 1 tablet (10 mg total) by mouth daily.      Dispense:  90 tablet      Refill:  3   metoprolol succinate (TOPROL-XL) 25 MG 24 hr tablet      Sig: Take 0.5 tablets (12.5 mg total) by mouth daily.      Dispense:  30 tablet      Refill:  0    Patient is stable at discharge.  Medication was refilled except atorvastatin  as he is allergic to it.   Discharge instructions   Follow-up and establish care with PCP. PCP resource was provided Medication was refilled.  ED/Hospital notes reviewed.    ROS:   Constitutional:  No f/c, No night sweats, No unexplained weight loss. EENT:  No vision changes, No blurry vision, No hearing changes. No mouth, throat, or ear problems.  Respiratory: No cough, No SOB Cardiac: No CP, no palpitations GI:  No abd pain, No N/V/D. GU: No Urinary s/sx Musculoskeletal: No joint pain Neuro: No headache, no dizziness, no motor weakness.  Skin: No rash Endocrine:  No polydipsia. No polyuria.  Psych: Denies SI/HI  No problems updated.  ALLERGIES: Allergies  Allergen Reactions   Lipitor [Atorvastatin] Swelling     Causes lip to swell    PAST MEDICAL HISTORY: Past Medical History:  Diagnosis Date   Gout    Hypertension     MEDICATIONS AT HOME: Prior to Admission medications   Medication Sig Start Date End Date Taking? Authorizing Provider  APPLE CIDER VINEGAR PO Take 1 capsule by mouth daily.   Yes [provider]  lisinopril (ZESTRIL) 20 MG tablet Take 1 tablet (20 mg total) by mouth daily. 09/28/20  Yes Carlos Griffins, MD  naproxen sodium (ALEVE) 220 MG tablet Take 440 mg by mouth daily as needed (headache/pain).   Yes [provider]  thiamine (VITAMIN B-1) 50 MG tablet Take 50 mg by mouth daily.   Yes [provider]  amLODipine (NORVASC) 10 MG tablet Take 1 tablet (10 mg total) by mouth daily. 01/29/21   Carlos Donovan, PA-C  aspirin 81 MG EC tablet Take 1 tablet (81 mg total) by mouth daily. Swallow whole. 01/29/21   Carlos Donovan, PA-C  colchicine 0.6 MG tablet Take 1 tablet (0.6 mg total) by mouth daily as  needed (gout). 01/29/21 02/28/21  Carlos Donovan, PA-C  ezetimibe (ZETIA) 10 MG tablet Take 1 tablet (10 mg total) by mouth daily. 01/29/21   Carlos Donovan, PA-C  hydrochlorothiazide (HYDRODIURIL) 25 MG tablet Take 1 tablet (25 mg total) by mouth daily. 01/29/21   Carlos Donovan, PA-C  metoprolol succinate (TOPROL-XL) 25 MG 24 hr tablet Take 0.5 tablets (12.5 mg total) by mouth daily. 01/29/21   Carlos Donovan, PA-C     Objective:  EXAM:   Vitals:   01/29/21 1409  BP: (!) 182/81  Pulse: 71  SpO2: 98%  Weight: 229 lb (103.9 kg)  Height: 5\' 7"  (1.702 m)    General appearance : A&OX3. NAD. Non-toxic-appearing HEENT: Atraumatic and Normocephalic.  PERRLA. EOM intact.   Chest/Lungs:  Breathing-non-labored, Good air entry bilaterally, breath sounds normal without rales, rhonchi, or wheezing  CVS: S1 S2 regular, no murmurs, gallops, rubs  Extremities: Bilateral Lower Ext shows no edema, both legs are warm to touch with = pulse  throughout Neurology:  CN II-XII grossly intact, Non focal.   Psych:  TP linear. J/I fair.  Slow speech. Appropriate eye contact and blunted affect.  Skin:  No Rash  Data Review Lab Results  Component Value Date   HGBA1C 5.9 (H) 09/27/2020     Assessment & Plan   1. Hypertension, unspecified type Uncontrolled but didn't take meds today.  Take meds as dited.  Will hold lisinopril for now given Cr. - amLODipine (NORVASC) 10 MG tablet; Take 1 tablet (10 mg total) by mouth daily.  Dispense: 30 tablet; Refill: 3 - hydrochlorothiazide (HYDRODIURIL) 25 MG tablet; Take 1 tablet (25 mg total) by mouth daily.  Dispense: 90 tablet; Refill: 3 - metoprolol succinate (TOPROL-XL) 25 MG 24 hr tablet; Take 0.5 tablets (12.5 mg total) by mouth daily.  Dispense: 30 tablet; Refill: 3 - CBC with Differential/Platelet  2. Encounter for examination following treatment at hospital  3. Abnormal kidney function - Comprehensive metabolic panel - CBC with Differential/Platelet  4. Prediabetes I have had a lengthy discussion and provided education about insulin resistance and the intake of too much sugar/refined carbohydrates.  I have advised the patient to work at a goal of eliminating sugary drinks, candy, desserts, sweets, refined sugars, processed foods, and white carbohydrates.  The patient expresses understanding.   - ezetimibe (ZETIA) 10 MG tablet; Take 1 tablet (10 mg total) by mouth daily.  Dispense: 90 tablet; Refill: 3 - Lipid panel  5. TIA (transient ischemic attack) - aspirin 81 MG EC tablet; Take 1 tablet (81 mg total) by mouth daily. Swallow whole.  Dispense: 30 tablet; Refill: 3 - hydrochlorothiazide (HYDRODIURIL) 25 MG tablet; Take 1 tablet (25 mg total) by mouth daily.  Dispense: 90 tablet; Refill: 3 - metoprolol succinate (TOPROL-XL) 25 MG 24 hr tablet; Take 0.5 tablets (12.5 mg total) by mouth daily.  Dispense: 30 tablet; Refill: 3 - CBC with Differential/Platelet  6. Hyperlipidemia,  unspecified hyperlipidemia type - ezetimibe (ZETIA) 10 MG tablet; Take 1 tablet (10 mg total) by mouth daily.  Dispense: 90 tablet; Refill: 3 - Lipid panel  7. Gout, unspecified cause, unspecified chronicity, unspecified site - colchicine 0.6 MG tablet; Take 1 tablet (0.6 mg total) by mouth daily as needed (gout).  Dispense: 30 tablet; Refill: 2     Patient have been counseled extensively about nutrition and exercise  Return in about 2 months (around 03/31/2021) for assign PCP.  The patient was given clear instructions to go to ER  or return to medical center if symptoms don't improve, worsen or new problems develop. The patient verbalized understanding. The patient was told to call to get lab results if they haven't heard anything in the next week.     Freeman Caldron, PA-C [facility] and [place], [place]   01/29/2021, 2:16 PM

## 2021-01-30 LAB — CBC WITH DIFFERENTIAL/PLATELET
Basophils Absolute: 0 10*3/uL (ref 0.0–0.2)
Basos: 1 %
EOS (ABSOLUTE): 0.2 10*3/uL (ref 0.0–0.4)
Eos: 4 %
Hematocrit: 41.3 % (ref 37.5–51.0)
Hemoglobin: 13.2 g/dL (ref 13.0–17.7)
Immature Grans (Abs): 0 10*3/uL (ref 0.0–0.1)
Immature Granulocytes: 0 %
Lymphocytes Absolute: 1.9 10*3/uL (ref 0.7–3.1)
Lymphs: 35 %
MCH: 25.8 pg — ABNORMAL LOW (ref 26.6–33.0)
MCHC: 32 g/dL (ref 31.5–35.7)
MCV: 81 fL (ref 79–97)
Monocytes Absolute: 0.8 10*3/uL (ref 0.1–0.9)
Monocytes: 14 %
Neutrophils Absolute: 2.5 10*3/uL (ref 1.4–7.0)
Neutrophils: 46 %
Platelets: 202 10*3/uL (ref 150–450)
RBC: 5.12 x10E6/uL (ref 4.14–5.80)
RDW: 13.6 % (ref 11.6–15.4)
WBC: 5.4 10*3/uL (ref 3.4–10.8)

## 2021-01-30 LAB — COMPREHENSIVE METABOLIC PANEL
ALT: 21 IU/L (ref 0–44)
AST: 24 IU/L (ref 0–40)
Albumin/Globulin Ratio: 1.3 (ref 1.2–2.2)
Albumin: 4.4 g/dL (ref 3.8–4.9)
Alkaline Phosphatase: 77 IU/L (ref 44–121)
BUN/Creatinine Ratio: 11 (ref 9–20)
BUN: 18 mg/dL (ref 6–24)
Bilirubin Total: 0.3 mg/dL (ref 0.0–1.2)
CO2: 26 mmol/L (ref 20–29)
Calcium: 9.5 mg/dL (ref 8.7–10.2)
Chloride: 99 mmol/L (ref 96–106)
Creatinine, Ser: 1.69 mg/dL — ABNORMAL HIGH (ref 0.76–1.27)
Globulin, Total: 3.3 g/dL (ref 1.5–4.5)
Glucose: 101 mg/dL — ABNORMAL HIGH (ref 65–99)
Potassium: 3.9 mmol/L (ref 3.5–5.2)
Sodium: 139 mmol/L (ref 134–144)
Total Protein: 7.7 g/dL (ref 6.0–8.5)
eGFR: 46 mL/min/{1.73_m2} — ABNORMAL LOW (ref >59)

## 2021-01-30 LAB — LIPID PANEL
Chol/HDL Ratio: 3.8 ratio (ref 0.0–5.0)
Cholesterol, Total: 189 mg/dL (ref 100–199)
HDL: 50 mg/dL (ref >39)
LDL Chol Calc (NIH): 117 mg/dL — ABNORMAL HIGH (ref 0–99)
Triglycerides: 123 mg/dL (ref 0–149)
VLDL Cholesterol Cal: 22 mg/dL (ref 5–40)

## 2021-02-02 ENCOUNTER — Encounter (HOSPITAL_COMMUNITY): Payer: Self-pay

## 2021-02-02 ENCOUNTER — Ambulatory Visit (HOSPITAL_COMMUNITY): Payer: 59 | Attending: Cardiovascular Disease

## 2021-02-02 NOTE — Progress Notes (Signed)
Verified appointment "no show" status with Jerre Simon at 15:57.

## 2021-02-03 ENCOUNTER — Encounter (HOSPITAL_COMMUNITY): Payer: Self-pay | Admitting: Internal Medicine

## 2021-02-24 ENCOUNTER — Telehealth (INDEPENDENT_AMBULATORY_CARE_PROVIDER_SITE_OTHER): Payer: 59 | Admitting: Internal Medicine

## 2021-02-24 ENCOUNTER — Other Ambulatory Visit: Payer: Self-pay

## 2021-02-24 ENCOUNTER — Encounter: Payer: Self-pay | Admitting: Internal Medicine

## 2021-02-24 VITALS — BP 140/68 | HR 87 | Ht 67.0 in | Wt 224.6 lb

## 2021-02-24 DIAGNOSIS — G459 Transient cerebral ischemic attack, unspecified: Secondary | ICD-10-CM | POA: Diagnosis not present

## 2021-02-24 DIAGNOSIS — I351 Nonrheumatic aortic (valve) insufficiency: Secondary | ICD-10-CM

## 2021-02-24 DIAGNOSIS — I10 Essential (primary) hypertension: Secondary | ICD-10-CM | POA: Diagnosis not present

## 2021-02-24 MED ORDER — HYDROCHLOROTHIAZIDE 25 MG PO TABS: 25.0000 mg | ORAL_TABLET | Freq: Every day | ORAL | 3 refills | Status: DC

## 2021-02-24 MED ORDER — METOPROLOL SUCCINATE ER 25 MG PO TB24: 12.5000 mg | ORAL_TABLET | Freq: Every day | ORAL | 3 refills | Status: DC

## 2021-02-24 NOTE — Progress Notes (Signed)
Cardiology Office Note:    Date:  02/24/2021   ID:  Carlos Hayes, DOB 04-10-1961, MRN [id number]  PCP:  Patient, No Pcp Per (Inactive)   [place]  Cardiologist:  Werner Lean, MD  Advanced Practice Provider:  No care team member to display Electrophysiologist:  None      CC: HTN f/u  I connected with  Carlos Hayes on 02/24/21 by a video enabled telemedicine application and verified that I am speaking with the correct person using two identifiers.   I discussed the limitations of evaluation and management by telemedicine. The patient expressed understanding and agreed to proceed. Patient: In Office Provider:  Home Modality: Phone Time: 8 minutes  History of Present Illness:    Carlos Hayes is a 60 y.o. male with a hx of HTN and TIA 09/27/20 who presents for evaluation 10/31/20. Worked to improve BP and diagnosed with moderate AI. Repeat echo not done.  HTN visit not done. Meds refilled 01/29/21 with urgent care. Seen in 02/24/21 virtually.   Patient notes that he is doing well.  Since last visit notes no changes or events like his prior AMS.   There are no interval hospital/ED visit (urgent care was for med refill).  No chest pain or pressure.  No SOB/DOE and no PND/Orthopnea.  No weight gain or leg swelling.  No palpitations or syncope.  Ambulatory blood pressure not done. Did not take metoprolol and amlodipine today.   Past Medical History:  Diagnosis Date   Gout    Hypertension     History reviewed. No pertinent surgical history.  Current Medications: Current Meds  Medication Sig   amLODipine (NORVASC) 10 MG tablet Take 1 tablet (10 mg total) by mouth daily.   APPLE CIDER VINEGAR PO Take 1 capsule by mouth daily.   aspirin 81 MG EC tablet Take 1 tablet (81 mg total) by mouth daily. Swallow whole.   colchicine 0.6 MG tablet Take 1 tablet (0.6 mg total) by mouth daily as needed (gout).   ezetimibe (ZETIA) 10 MG tablet Take  1 tablet (10 mg total) by mouth daily.   lisinopril (ZESTRIL) 20 MG tablet Take 1 tablet (20 mg total) by mouth daily.   naproxen sodium (ALEVE) 220 MG tablet Take 440 mg by mouth daily as needed (headache/pain).   [DISCONTINUED] hydrochlorothiazide (HYDRODIURIL) 25 MG tablet Take 1 tablet (25 mg total) by mouth daily.   [DISCONTINUED] metoprolol succinate (TOPROL-XL) 25 MG 24 hr tablet Take 0.5 tablets (12.5 mg total) by mouth daily.     Allergies:   Lipitor [atorvastatin]   Social History   Socioeconomic History   Marital status: Single    Spouse name: Not on file   Number of children: Not on file   Years of education: Not on file   Highest education level: Not on file  Occupational History   Not on file  Tobacco Use   Smoking status: Never   Smokeless tobacco: Never  Vaping Use   Vaping Use: Never used  Substance and Sexual Activity   Alcohol use: Yes   Drug use: No   Sexual activity: Not on file  Other Topics Concern   Not on file  Social History Narrative   Not on file   Social Determinants of Health   Financial Resource Strain: Not on file  Food Insecurity: Not on file  Transportation Needs: Not on file  Physical Activity: Not on file  Stress: Not on file  Social Connections: Not  on file    Family History: History of coronary artery disease notable for no members. History of heart failure notable for no members. History of arrhythmia notable for no members.  ROS:   Please see the history of present illness.     All other systems reviewed and are negative.  EKGs/Labs/Other Studies Reviewed:    The following studies were reviewed today:   EKG:   09/26/20: SR 1st HN rate 82 LVH  Transthoracic Echocardiogram: Date: 09/27/20 Results: Moderate to  severe AI 1. Left ventricular ejection fraction, by estimation, is 55 to 60%. The  left ventricle has normal function. Left ventricular endocardial border  not optimally defined to evaluate regional wall motion.  There is moderate  left ventricular hypertrophy. Left  ventricular diastolic parameters are consistent with Grade II diastolic  dysfunction (pseudonormalization).   2. Right ventricular systolic function is normal. The right ventricular  size is normal. Tricuspid regurgitation signal is inadequate for assessing  PA pressure.   3. Left atrial size was moderately dilated.   4. Right atrial size was mild to moderately dilated.   5. The mitral valve is normal in structure. Trivial mitral valve  regurgitation. No evidence of mitral stenosis.   6. The aortic valve is abnormal. Aortic valve regurgitation is moderate  to moderate-severe. No aortic stenosis is present. Aortic regurgitation  PHT measures 364 msec. Note BP at time of study is 187/94 mmHg. AV appears  mildly degenerative, no definite  leaflet perforation noted. Jet of AR is eccentric and directed toward  anterior mitral leaflet. Quantitation challenging with eccentric jet.   7. The inferior vena cava is normal in size with greater than 50%  respiratory variability, suggesting right atrial pressure of 3 mmHg.   8. Cannot exclude atrial level shunt.     Recent Labs: 01/29/2021: ALT 21; BUN 18; Creatinine, Ser 1.69; Hemoglobin 13.2; Platelets 202; Potassium 3.9; Sodium 139  Recent Lipid Panel    Component Value Date/Time   CHOL 189 01/29/2021 1431   TRIG 123 01/29/2021 1431   HDL 50 01/29/2021 1431   CHOLHDL 3.8 01/29/2021 1431   CHOLHDL 4.2 09/27/2020 0111   VLDL 15 09/27/2020 0111   LDLCALC 117 (H) 01/29/2021 1431    Risk Assessment/Calculations:     N/A  Physical Exam:    VS:  BP 140/68 (BP Location: Right Arm, Patient Position: Sitting, Cuff Size: Large)   Pulse 87   Ht 5\' 7"  (1.702 m)   Wt 101.9 kg   BMI 35.18 kg/m     Wt Readings from Last 3 Encounters:  02/24/21 101.9 kg  01/29/21 103.9 kg  10/31/20 104.3 kg    Phone Visit Resp: can speak full sentence Gen: Well in no distress  ASSESSMENT:    1.  Nonrheumatic aortic valve insufficiency   2. Hypertension, unspecified type   3. TIA (transient ischemic attack)     PLAN:    In order of problems listed above:  Moderate Aortic Insufficiency HTN History of TIA vs HTN emergency - continue his current antihypertensives; given slight elevation with missing meds suspect he is controled when he has refills - will get echo and repeat; if unclear AI severity, CMR vs TEE for assessment  Fall-Winter follow up   Medication Adjustments/Labs and Tests Ordered: Current medicines are reviewed at length with the patient today.  Concerns regarding medicines are outlined above.  Orders Placed This Encounter  Procedures   ECHOCARDIOGRAM COMPLETE    Meds ordered this encounter  Medications  hydrochlorothiazide (HYDRODIURIL) 25 MG tablet    Sig: Take 1 tablet (25 mg total) by mouth daily.    Dispense:  90 tablet    Refill:  3   metoprolol succinate (TOPROL-XL) 25 MG 24 hr tablet    Sig: Take 0.5 tablets (12.5 mg total) by mouth daily.    Dispense:  45 tablet    Refill:  3     Patient Instructions  Medication Instructions:  Your physician recommends that you continue on your current medications as directed. Please refer to the Current Medication list given to you today. Hydrochlorothiazide (HCTZ) and Metoprolol succinate (Toprol-XL) were refilled for you today *If you need a refill on your cardiac medications before your next appointment, please call your pharmacy*   Lab Work: NONE If you have labs (blood work) drawn today and your tests are completely normal, you will receive your results only by: [place] (if you have MyChart) OR A paper copy in the mail If you have any lab test that is abnormal or we need to change your treatment, we will call you to review the results.   Testing/Procedures: Your physician has requested that you have an echocardiogram. Echocardiography is a painless test that uses sound waves to create  images of your heart. It provides your doctor with information about the size and shape of your heart and how well your heart's chambers and valves are working. This procedure takes approximately one hour. There are no restrictions for this procedure.    Follow-Up: At [facility], you and your health needs are our priority.  As part of our continuing mission to provide you with exceptional heart care, we have created designated Provider Care Teams.  These Care Teams include your primary Cardiologist (physician) and Advanced Practice Providers (APPs -  Physician Assistants and Nurse Practitioners) who all work together to provide you with the care you need, when you need it.  We recommend signing up for the patient portal called "MyChart".  Sign up information is provided on this After Visit Summary.  MyChart is used to connect with patients for Virtual Visits (Telemedicine).  Patients are able to view lab/test results, encounter notes, upcoming appointments, etc.  Non-urgent messages can be sent to your provider as well.   To learn more about what you can do with MyChart, go to [email or web address].    Your next appointment:   4-5 month(s)  The format for your next appointment:   In Person  Provider:   You may see Werner Lean, MD or one of the following Advanced Practice Providers on your designated Care Team:   Melina Copa, PA-C Ermalinda Barrios, PA-C        Signed, Werner Lean, MD  02/24/2021 3:58 PM    [place]

## 2021-02-24 NOTE — Patient Instructions (Addendum)
Medication Instructions:  Your physician recommends that you continue on your current medications as directed. Please refer to the Current Medication list given to you today. Hydrochlorothiazide (HCTZ) and Metoprolol succinate (Toprol-XL) were refilled for you today *If you need a refill on your cardiac medications before your next appointment, please call your pharmacy*   Lab Work: NONE If you have labs (blood work) drawn today and your tests are completely normal, you will receive your results only by: [place] (if you have MyChart) OR A paper copy in the mail If you have any lab test that is abnormal or we need to change your treatment, we will call you to review the results.   Testing/Procedures: Your physician has requested that you have an echocardiogram. Echocardiography is a painless test that uses sound waves to create images of your heart. It provides your doctor with information about the size and shape of your heart and how well your heart's chambers and valves are working. This procedure takes approximately one hour. There are no restrictions for this procedure.    Follow-Up: At [facility], you and your health needs are our priority.  As part of our continuing mission to provide you with exceptional heart care, we have created designated Provider Care Teams.  These Care Teams include your primary Cardiologist (physician) and Advanced Practice Providers (APPs -  Physician Assistants and Nurse Practitioners) who all work together to provide you with the care you need, when you need it.  We recommend signing up for the patient portal called "MyChart".  Sign up information is provided on this After Visit Summary.  MyChart is used to connect with patients for Virtual Visits (Telemedicine).  Patients are able to view lab/test results, encounter notes, upcoming appointments, etc.  Non-urgent messages can be sent to your provider as well.   To learn more about what you can do  with MyChart, go to [email or web address].    Your next appointment:   4-5 month(s)  The format for your next appointment:   In Person  Provider:   You may see Werner Lean, MD or one of the following Advanced Practice Providers on your designated Care Team:   Melina Copa, PA-C Ermalinda Barrios, PA-C

## 2021-03-04 ENCOUNTER — Other Ambulatory Visit (HOSPITAL_COMMUNITY): Payer: 59

## 2021-03-12 ENCOUNTER — Other Ambulatory Visit (HOSPITAL_COMMUNITY): Payer: 59

## 2021-04-04 ENCOUNTER — Other Ambulatory Visit: Payer: Self-pay | Admitting: Physician Assistant

## 2021-04-04 DIAGNOSIS — M109 Gout, unspecified: Secondary | ICD-10-CM

## 2021-04-04 NOTE — Telephone Encounter (Signed)
Requested medication (s) are due for refill today: med expired  Requested medication (s) are on the active medication list: yes but has exp date  Last refill:  01/29/21  Future visit scheduled: no  Notes to clinic:  prescription expired/ over due lab   Requested Prescriptions  Pending Prescriptions Disp Refills   colchicine 0.6 MG tablet [Pharmacy Med Name: COLCHICINE 0.6 MG TABLET] 30 tablet 2    Sig: Take 1 tablet (0.6 mg total) by mouth daily as needed (gout).     Endocrinology:  Gout Agents Failed - 04/04/2021  1:30 PM      Failed - Uric Acid in normal range and within 360 days    No results found for: POCURA, LABURIC        Failed - Cr in normal range and within 360 days    Creatinine, Ser  Date Value Ref Range Status  01/29/2021 1.69 (H) 0.76 - 1.27 mg/dL Final          Passed - Valid encounter within last 12 months    Recent Outpatient Visits           2 months ago Hypertension, unspecified type   [place], Dionne Bucy, [place]       Future Appointments             In 2 months Dunn, Nedra Hai, PA-C [place], [facility]

## 2021-04-26 ENCOUNTER — Other Ambulatory Visit: Payer: Self-pay | Admitting: Physician Assistant

## 2021-04-26 DIAGNOSIS — G459 Transient cerebral ischemic attack, unspecified: Secondary | ICD-10-CM

## 2021-05-29 ENCOUNTER — Other Ambulatory Visit: Payer: Self-pay | Admitting: Physician Assistant

## 2021-05-29 DIAGNOSIS — I10 Essential (primary) hypertension: Secondary | ICD-10-CM

## 2021-05-29 NOTE — Telephone Encounter (Signed)
Requested medications are due for refill today yes  Requested medications are on the active medication list yes  Last refill 04/28/21  Last visit 6/23  Future visit scheduled NO, asked to return in two months, given 4 months of rx, no visit scheduled.  Notes to clinic does pass standard protocol but only given 4 month supply and asked to return in two months and did not, please assess.  Requested Prescriptions  Pending Prescriptions Disp Refills   amLODipine (NORVASC) 10 MG tablet [Pharmacy Med Name: AMLODIPINE BESYLATE 10 MG TAB] 30 tablet 3    Sig: TAKE 1 TABLET BY MOUTH EVERY DAY     Cardiovascular:  Calcium Channel Blockers Failed - 05/29/2021  1:50 AM      Failed - Last BP in normal range    BP Readings from Last 1 Encounters:  02/24/21 140/68          Passed - Valid encounter within last 6 months    Recent Outpatient Visits           4 months ago Hypertension, unspecified type   [place], Dionne Bucy, [place]       Future Appointments             In 1 month Idolina Primer, Nedra Hai, PA-C [place], [facility]            '

## 2021-06-28 ENCOUNTER — Encounter: Payer: Self-pay | Admitting: Physician Assistant

## 2021-06-28 NOTE — Progress Notes (Signed)
Cardiology Office Note    Date:  06/30/2021   ID:  Carlos Hayes, DOB 08-18-1960, MRN [id number]  PCP:  Patient, No Pcp Per (Inactive)  Cardiologist:  Werner Lean, MD  Electrophysiologist:  None   Chief Complaint: f/u HTN, AI  History of Present Illness:   Carlos Hayes is a 60 y.o. male with history of HTN, TIA, gout, CKD stage III (borderline a-b by labs) who presents for follow-up. He established care with Dr. Gasper Sells 10/2020. He had been admitted 09/2020 for TIA vs hypertensive encephalopathy. He had presented with transient slurred speech. CT was nonacute. MRI showed scattered cerebral microbleeds felt related to longstanding poorly controlled HTN. MRA reported to be unremarkable. He was placed on DAPT x 3 weeks followed by ASA alone. He was started on atorvastatin but this was later discontinued due to lip swelling and started on Zetia. During admission, echo 09/2020 showed EF 55-60%, grade 2 DD, mod LAE, mild-mod RAE, moderate-severe AI, cannot exclude atrial level shunt. When he saw Dr. Gasper Sells, BP meds were adjusted and f/u echo was recommended. The patient did not have this done. It appears he also saw urgent care for a refill visit in 12/2020 having run out of his medicines.  He is seen back for follow-up today, presents alone. He denies any cardiac complaints. No CP, SOB, palpitations, edema, syncope, dizziness. He checks his BP at home and tends to see readings 528-413 systolic. He reports compliance with his medicines but when we go through his list I am not sure he knows what he is taking. He did not recall taking lisinopril or metoprolol, then said he may be taking 1 whole tablet of metoprolol. We discussed possible barriers to care and he did not offer any specific barriers aside from pronunciation of the medications.   Labwork independently reviewed: 01/2021 Hgb 13.2, LDL 117, trig 123, Cr 1.69, K 3.9, LFTS ok 09/2020 UA 100 protein   Past Medical  History:  Diagnosis Date   Aortic insufficiency    Chronic kidney disease, stage 3 (HCC)    Gout    Hyperlipidemia    Hypertension    TIA (transient ischemic attack)     History reviewed. No pertinent surgical history.  Current Medications: Current Meds  Medication Sig   amLODipine (NORVASC) 10 MG tablet TAKE 1 TABLET BY MOUTH EVERY DAY   APPLE CIDER VINEGAR PO Take 1 capsule by mouth daily.   aspirin 81 MG EC tablet TAKE 1 TABLET (81 MG TOTAL) BY MOUTH DAILY. SWALLOW WHOLE.   colchicine 0.6 MG tablet TAKE 1 TABLET (0.6 MG TOTAL) BY MOUTH DAILY AS NEEDED (GOUT).   ezetimibe (ZETIA) 10 MG tablet Take 1 tablet (10 mg total) by mouth daily.   hydrochlorothiazide (HYDRODIURIL) 25 MG tablet Take 1 tablet (25 mg total) by mouth daily.   lisinopril (ZESTRIL) 20 MG tablet Take 1 tablet (20 mg total) by mouth daily.   metoprolol succinate (TOPROL-XL) 25 MG 24 hr tablet Take 0.5 tablets (12.5 mg total) by mouth daily.   naproxen sodium (ALEVE) 220 MG tablet Take 440 mg by mouth daily as needed (headache/pain).   thiamine (VITAMIN B-1) 50 MG tablet Take 50 mg by mouth daily.     Allergies:   Lipitor [atorvastatin]   Social History   Socioeconomic History   Marital status: Single    Spouse name: Not on file   Number of children: Not on file   Years of education: Not on file   Highest education  level: Not on file  Occupational History   Not on file  Tobacco Use   Smoking status: Never   Smokeless tobacco: Never  Vaping Use   Vaping Use: Never used  Substance and Sexual Activity   Alcohol use: Yes   Drug use: No   Sexual activity: Not on file  Other Topics Concern   Not on file  Social History Narrative   Not on file   Social Determinants of Health   Financial Resource Strain: Not on file  Food Insecurity: Not on file  Transportation Needs: Not on file  Physical Activity: Not on file  Stress: Not on file  Social Connections: Not on file     Family History:  The  patient's family history is negative for Heart disease.  ROS:   Please see the history of present illness. All other systems are reviewed and otherwise negative.    EKGs/Labs/Other Studies Reviewed:    Studies reviewed are outlined and summarized above. Reports included below if pertinent.  2d echo 09/2020   1. Left ventricular ejection fraction, by estimation, is 55 to 60%. The  left ventricle has normal function. Left ventricular endocardial border  not optimally defined to evaluate regional wall motion. There is moderate  left ventricular hypertrophy. Left  ventricular diastolic parameters are consistent with Grade II diastolic  dysfunction (pseudonormalization).   2. Right ventricular systolic function is normal. The right ventricular  size is normal. Tricuspid regurgitation signal is inadequate for assessing  PA pressure.   3. Left atrial size was moderately dilated.   4. Right atrial size was mild to moderately dilated.   5. The mitral valve is normal in structure. Trivial mitral valve  regurgitation. No evidence of mitral stenosis.   6. The aortic valve is abnormal. Aortic valve regurgitation is moderate  to moderate-severe. No aortic stenosis is present. Aortic regurgitation  PHT measures 364 msec. Note BP at time of study is 187/94 mmHg. AV appears  mildly degenerative, no definite  leaflet perforation noted. Jet of AR is eccentric and directed toward  anterior mitral leaflet. Quantitation challenging with eccentric jet.   7. The inferior vena cava is normal in size with greater than 50%  respiratory variability, suggesting right atrial pressure of 3 mmHg.   8. Cannot exclude atrial level shunt.     EKG:  EKG is ordered today, personally reviewed, demonstrating NSR 70bpm, suspect LVH with secondary repolarization changes (most similar to 2018 tracing)  Recent Labs: 01/29/2021: ALT 21; BUN 18; Creatinine, Ser 1.69; Hemoglobin 13.2; Platelets 202; Potassium 3.9; Sodium  139  Recent Lipid Panel    Component Value Date/Time   CHOL 189 01/29/2021 1431   TRIG 123 01/29/2021 1431   HDL 50 01/29/2021 1431   CHOLHDL 3.8 01/29/2021 1431   CHOLHDL 4.2 09/27/2020 0111   VLDL 15 09/27/2020 0111   LDLCALC 117 (H) 01/29/2021 1431    PHYSICAL EXAM:    VS:  BP (!) 148/80   Pulse 70   Ht 5\' 7"  (1.702 m)   Wt 224 lb 12.8 oz (102 kg)   SpO2 98%   BMI 35.21 kg/m   BMI: Body mass index is 35.21 kg/m.  GEN: Well nourished, well developed male in no acute distress HEENT: normocephalic, atraumatic Neck: no JVD, carotid bruits, or masses Cardiac: RRR; soft diastolic murmur RUSB, no rubs or gallops, no edema  Respiratory:  clear to auscultation bilaterally, normal work of breathing GI: soft, nontender, nondistended, + BS MS: no deformity or  atrophy Skin: warm and dry, no rash Neuro:  Alert and Oriented x 3, Strength and sensation are intact, follows commands Psych: euthymic mood, full affect  Wt Readings from Last 3 Encounters:  06/30/21 224 lb 12.8 oz (102 kg)  02/24/21 224 lb 9.6 oz (101.9 kg)  01/29/21 229 lb (103.9 kg)     ASSESSMENT & PLAN:   1. Aortic insufficiency - Dr. Gasper Sells previously recommended to repeat echocardiogram in June 2022 but the patient did not have this done.Will repeat. He has suggested to pursue TEE if no improvement. Also note question of atrial level shunt.  2. Essential HTN - suboptimal BP noted today. The patient does not seem particularly engaged with his blood pressure management, and is not really familiar with all of his antihypertensives. I question whether he may have poor health literacy. This will make management challenging and require closer follow-up. We will recheck labs today to ensure K, Cr normal along with thyroid - when we call him with lab results, I asked him to line up all of his medicine bottles and read [place] off the names. I am hopeful perhaps we can consider changing metoprolol to carvedilol and titrating  upward. We discussed the long term impact of poorly controlled blood pressure.  3. History of TIA with hyperlipidemia, also question of atrial level shunt - continue ASA. Work on BP. Will also check CMET/lipid profile today - if LDL remains suboptimally controlled, which I suspect it may be, will also recommend our pharmacy team review candidacy for alternatives like PCSK9i. Will also f/u echo result to determine if atrial level shunt present.  4. CKD stage III, unspecified a-b as borderline - recheck labs today. Discussed risk of progression if BP left untreated.    Disposition: F/u with pharmD in 2-3 weeks for BP management and 3 months with Dr. Gasper Sells or myself.   Medication Adjustments/Labs and Tests Ordered: Current medicines are reviewed at length with the patient today.  Concerns regarding medicines are outlined above. Medication changes, Labs and Tests ordered today are summarized above and listed in the Patient Instructions accessible in Encounters.   Signed, Charlie Pitter, PA-C  06/30/2021 4:10 PM    [facility] [place], [place], [place]  [address] Phone: [phone]; Fax: [phone]

## 2021-06-30 ENCOUNTER — Encounter: Payer: Self-pay | Admitting: Physician Assistant

## 2021-06-30 ENCOUNTER — Ambulatory Visit (INDEPENDENT_AMBULATORY_CARE_PROVIDER_SITE_OTHER): Payer: 59 | Admitting: Physician Assistant

## 2021-06-30 ENCOUNTER — Other Ambulatory Visit: Payer: Self-pay

## 2021-06-30 VITALS — BP 148/80 | HR 70 | Ht 67.0 in | Wt 224.8 lb

## 2021-06-30 DIAGNOSIS — I10 Essential (primary) hypertension: Secondary | ICD-10-CM | POA: Diagnosis not present

## 2021-06-30 DIAGNOSIS — E785 Hyperlipidemia, unspecified: Secondary | ICD-10-CM | POA: Diagnosis not present

## 2021-06-30 DIAGNOSIS — I351 Nonrheumatic aortic (valve) insufficiency: Secondary | ICD-10-CM | POA: Diagnosis not present

## 2021-06-30 DIAGNOSIS — N183 Chronic kidney disease, stage 3 unspecified: Secondary | ICD-10-CM

## 2021-06-30 DIAGNOSIS — G459 Transient cerebral ischemic attack, unspecified: Secondary | ICD-10-CM

## 2021-06-30 NOTE — Patient Instructions (Signed)
Medication Instructions:  Your physician recommends that you continue on your current medications as directed. Please refer to the Current Medication list given to you today.  *If you need a refill on your cardiac medications before your next appointment, please call your pharmacy*   Lab Work: CMET, TSH, LIPID, & DIRECT LDL  If you have labs (blood work) drawn today and your tests are completely normal, you will receive your results only by: [place] (if you have MyChart) OR A paper copy in the mail If you have any lab test that is abnormal or we need to change your treatment, we will call you to review the results.   Testing/Procedures: Your physician has requested that you have an echocardiogram. Echocardiography is a painless test that uses sound waves to create images of your heart. It provides your doctor with information about the size and shape of your heart and how well your heart's chambers and valves are working. This procedure takes approximately one hour. There are no restrictions for this procedure.  Your physician recommends that you schedule a follow-up appointment in:  2-3 [place] [facility] FOR MEDICATION MANAGEMENT.  PLEASE BRING ALL OF YOU MEDICATION BOTTLES WITH YOU TO THIS APPOINTMENT  Follow-Up: At [facility], you and your health needs are our priority.  As part of our continuing mission to provide you with exceptional heart care, we have created designated Provider Care Teams.  These Care Teams include your primary Cardiologist (physician) and Advanced Practice Providers (APPs -  Physician Assistants and Nurse Practitioners) who all work together to provide you with the care you need, when you need it.  We recommend signing up for the patient portal called "MyChart".  Sign up information is provided on this After Visit Summary.  MyChart is used to connect with patients for Virtual Visits (Telemedicine).  Patients are able to view lab/test results,  encounter notes, upcoming appointments, etc.  Non-urgent messages can be sent to your provider as well.   To learn more about what you can do with MyChart, go to [email or web address].    Your next appointment:   3 month(s)  The format for your next appointment:   In Person  Provider:   Werner Lean, MD  OR Melina Copa, PA-C   Other Instructions

## 2021-07-01 ENCOUNTER — Telehealth: Payer: Self-pay | Admitting: *Deleted

## 2021-07-01 LAB — COMPREHENSIVE METABOLIC PANEL
ALT: 26 IU/L (ref 0–44)
AST: 25 IU/L (ref 0–40)
Albumin/Globulin Ratio: 1.5 (ref 1.2–2.2)
Albumin: 4.6 g/dL (ref 3.8–4.9)
Alkaline Phosphatase: 64 IU/L (ref 44–121)
BUN/Creatinine Ratio: 13 (ref 9–20)
BUN: 22 mg/dL (ref 6–24)
Bilirubin Total: 0.3 mg/dL (ref 0.0–1.2)
CO2: 26 mmol/L (ref 20–29)
Calcium: 9.7 mg/dL (ref 8.7–10.2)
Chloride: 96 mmol/L (ref 96–106)
Creatinine, Ser: 1.65 mg/dL — ABNORMAL HIGH (ref 0.76–1.27)
Globulin, Total: 3 g/dL (ref 1.5–4.5)
Glucose: 86 mg/dL (ref 70–99)
Potassium: 3.9 mmol/L (ref 3.5–5.2)
Sodium: 137 mmol/L (ref 134–144)
Total Protein: 7.6 g/dL (ref 6.0–8.5)
eGFR: 48 mL/min/{1.73_m2} — ABNORMAL LOW (ref >59)

## 2021-07-01 LAB — LIPID PANEL
Chol/HDL Ratio: 3.6 ratio (ref 0.0–5.0)
Cholesterol, Total: 181 mg/dL (ref 100–199)
HDL: 50 mg/dL (ref >39)
LDL Chol Calc (NIH): 109 mg/dL — ABNORMAL HIGH (ref 0–99)
Triglycerides: 126 mg/dL (ref 0–149)
VLDL Cholesterol Cal: 22 mg/dL (ref 5–40)

## 2021-07-01 LAB — LDL CHOLESTEROL, DIRECT: LDL Direct: 106 mg/dL — ABNORMAL HIGH (ref 0–99)

## 2021-07-01 LAB — TSH: TSH: 1.13 u[IU]/mL (ref 0.450–4.500)

## 2021-07-01 NOTE — Telephone Encounter (Signed)
-----   Message from Charlie Pitter, [place] sent at 07/01/2021  7:51 AM EST ----- Please let patient know labs stable with chronic kidney disease noted. Cholesterol still too high. Can we add lipid management to pharmD visit please, since he is allergic to statins and already on Zetia. Please find out what meds he is taking so we can make adjustments -> can cross this result note over to a phone note and route to me that way so it's easier for others to follow in the chart.

## 2021-07-01 NOTE — Telephone Encounter (Signed)
Call placed to pt regarding phone note below, left a message for him to call back.

## 2021-07-01 NOTE — Telephone Encounter (Addendum)
Thank you. (Documentation only, do not need to relay to patient: Despite elevation in creatinine, CrCl is fairly preserved at 32ml/min. I looked back through his chart and do not see where he had lisinopril refilled after his initial hospitalization where it was prescribed #30 with 1 refill, but I do not see there was ever any instruction to discontinue. Prefer ARB to ACEi in case of cough.)  I would suggest we start irbesartan 150mg  daily with recheck BMET in 1 week.   Patients with kidney issues should generally stay away from medicines like ibuprofen, Advil, Motrin, naproxen, and Aleve due to risk of worsening kidney function. He can take Tylenol as directed or talk to primary doctor about alternatives for pain issues.  Otherwise continue plan as discussed.

## 2021-07-01 NOTE — Telephone Encounter (Signed)
Called pt with his lab results.  Went over pt's medications with him, with his bottles at hand, and confirmed he is taking all medications listed, except Lisinopril.  I have removed that from the list.

## 2021-07-04 ENCOUNTER — Other Ambulatory Visit: Payer: Self-pay | Admitting: Family Medicine

## 2021-07-04 DIAGNOSIS — I10 Essential (primary) hypertension: Secondary | ICD-10-CM

## 2021-07-05 NOTE — Telephone Encounter (Signed)
Requested medication (s) are due for refill today: yes  Requested medication (s) are on the active medication list: yes  Last refill:  06/03/21 #30  Future visit scheduled: no  Notes to clinic:  called pt and informed him he is due for an office visit in order to get refills. Pt stated that he will call back to make appt.   Requested Prescriptions  Pending Prescriptions Disp Refills   amLODipine (NORVASC) 10 MG tablet [Pharmacy Med Name: AMLODIPINE BESYLATE 10 MG TAB] 30 tablet 0    Sig: TAKE 1 TABLET BY MOUTH EVERY DAY     Cardiovascular:  Calcium Channel Blockers Failed - 07/04/2021  9:31 AM      Failed - Last BP in normal range    BP Readings from Last 1 Encounters:  06/30/21 (!) 148/80          Passed - Valid encounter within last 6 months    Recent Outpatient Visits           5 months ago Hypertension, unspecified type   [place], [place]       Future Appointments             In 2 weeks  [place], [facility]   In 2 months [place], Terisa Starr, MD [place], [facility]

## 2021-07-08 NOTE — Telephone Encounter (Signed)
2nd attempt to call pt regarding message below.  Left another message for pt to call back.

## 2021-07-09 ENCOUNTER — Telehealth: Payer: Self-pay | Admitting: Pharmacist

## 2021-07-09 NOTE — Telephone Encounter (Signed)
LVM for pt to call back to move apt due to provider shortage

## 2021-07-10 NOTE — Telephone Encounter (Signed)
3rd attempt to reach pt regarding message below, left another message for him to call back.

## 2021-07-13 ENCOUNTER — Other Ambulatory Visit: Payer: Self-pay | Admitting: Family Medicine

## 2021-07-13 DIAGNOSIS — I10 Essential (primary) hypertension: Secondary | ICD-10-CM

## 2021-07-22 ENCOUNTER — Encounter: Payer: Self-pay | Admitting: *Deleted

## 2021-07-22 NOTE — Telephone Encounter (Signed)
After several attempt to reach pt, will mail letter for pt to call back and will close this encounter.

## 2021-07-23 ENCOUNTER — Ambulatory Visit: Payer: 59

## 2021-07-28 ENCOUNTER — Encounter (HOSPITAL_COMMUNITY): Payer: Self-pay | Admitting: Internal Medicine

## 2021-07-28 ENCOUNTER — Other Ambulatory Visit (HOSPITAL_COMMUNITY): Payer: 59

## 2021-07-28 ENCOUNTER — Encounter (HOSPITAL_COMMUNITY): Payer: Self-pay

## 2021-07-28 NOTE — Progress Notes (Signed)
Verified appointment "no show" status with T. [place] at 11:29.

## 2021-07-30 ENCOUNTER — Other Ambulatory Visit: Payer: Self-pay

## 2021-07-30 ENCOUNTER — Ambulatory Visit (INDEPENDENT_AMBULATORY_CARE_PROVIDER_SITE_OTHER): Payer: 59 | Admitting: Pharmacist

## 2021-07-30 VITALS — BP 182/100 | HR 77

## 2021-07-30 DIAGNOSIS — G459 Transient cerebral ischemic attack, unspecified: Secondary | ICD-10-CM | POA: Diagnosis not present

## 2021-07-30 DIAGNOSIS — I10 Essential (primary) hypertension: Secondary | ICD-10-CM

## 2021-07-30 DIAGNOSIS — E785 Hyperlipidemia, unspecified: Secondary | ICD-10-CM | POA: Diagnosis not present

## 2021-07-30 MED ORDER — ROSUVASTATIN CALCIUM 10 MG PO TABS: 10.0000 mg | ORAL_TABLET | Freq: Every day | ORAL | 5 refills | Status: DC

## 2021-07-30 MED ORDER — HYDROCHLOROTHIAZIDE 25 MG PO TABS: 25.0000 mg | ORAL_TABLET | Freq: Every day | ORAL | 3 refills | Status: DC

## 2021-07-30 MED ORDER — METOPROLOL SUCCINATE ER 25 MG PO TB24: 25.0000 mg | ORAL_TABLET | Freq: Every day | ORAL | 3 refills | Status: DC

## 2021-07-30 MED ORDER — AMLODIPINE BESYLATE 10 MG PO TABS: 10.0000 mg | ORAL_TABLET | Freq: Every day | ORAL | 3 refills | Status: DC

## 2021-07-30 MED ORDER — IRBESARTAN 150 MG PO TABS: 150.0000 mg | ORAL_TABLET | Freq: Every day | ORAL | 5 refills | Status: DC

## 2021-07-30 NOTE — Progress Notes (Addendum)
Patient ID: Carlos Hayes                 DOB: 06/13/1961                    MRN: [id number]     HPI: Carlos Hayes is a 60 y.o. male patient of Dr Gasper Sells referred to PharmD clinic for lipid and HTN management by Melina Copa, PA. PMH is significant for HTN, HLD, TIA, CKD stage III, aortic insufficiency, and gout. Echo 09/2020 showed EF 55-60%, moderate LV hypertrophy with G2DD. After most recent visit with Dayna, he was advised to start irbesartan however could not be reached despite at least 4 phone call attempts.  Pt presents today for follow up. Has been out of his metoprolol for the past week and amlodipine for the past 2 weeks. Does not look like his prescription had remaining refills and rx were never sent back in. Takes 1-2 Aleve every day. Uses colchicine as needed for gout, took some the other day for gout flare in his toe. Recalls 1 BP reading he checked using the BP cuff at [facility], doesn't have a cuff at home. Reading was elevated at 175/70-80s. This was before he ran out of his meds. Had a headache yesterday, otherwise denies LE edema, blurred vision, and headaches.   Tried to clarify lip swelling with atorvastatin as this was started at the same time that lisinopril was which is more likely to cause facial swelling. Pt reports he noticed the swelling in his lips after taking atorvastatin for 1-2 weeks. He stopped the atorvastatin at home, didn't go the ED, no trouble breathing and no other swelling that he noted, and sx resolved. Thinks he stayed on lisinopril a bit longer (also had issues with not enough refills being given of these so he stopped it after the initial 1-2 fills).  Current lipid meds: ezetimibe 10mg  daily Intolerances: atorvastatin 80mg  daily- lip swelling Risk Factors: TIA, CKD LDL goal: 70mg /dL  Current BP meds: amlodipine 10mg  daily, HCTZ 25mg  daily, Toprol 12.5mg  daily BP goal: < 130/83mmHg  Diet: 1 cup of coffee and tea a day,   Exercise: Works in  Information systems manager  Family History  Problem Relation Age of Onset   Heart disease Neg Hx    Social History: Denies tobacco and drug use.  Labs: 06/30/21: TC 181, TG 126, HDL 50, LDL 109 (ezetimibe 10mg  daily) SCr 1.65, Na 137, K 3.9  Past Medical History:  Diagnosis Date   Aortic insufficiency    Chronic kidney disease, stage 3 (HCC)    Gout    Hyperlipidemia    Hypertension    TIA (transient ischemic attack)     Current Outpatient Medications on File Prior to Visit  Medication Sig Dispense Refill   amLODipine (NORVASC) 10 MG tablet TAKE 1 TABLET BY MOUTH EVERY DAY 30 tablet 0   APPLE CIDER VINEGAR PO Take 1 capsule by mouth daily.     aspirin 81 MG EC tablet TAKE 1 TABLET (81 MG TOTAL) BY MOUTH DAILY. SWALLOW WHOLE. 90 tablet 1   colchicine 0.6 MG tablet TAKE 1 TABLET (0.6 MG TOTAL) BY MOUTH DAILY AS NEEDED (GOUT). 30 tablet 2   ezetimibe (ZETIA) 10 MG tablet Take 1 tablet (10 mg total) by mouth daily. 90 tablet 3   hydrochlorothiazide (HYDRODIURIL) 25 MG tablet Take 1 tablet (25 mg total) by mouth daily. 90 tablet 3   metoprolol succinate (TOPROL-XL) 25 MG 24 hr tablet Take  0.5 tablets (12.5 mg total) by mouth daily. 45 tablet 3   naproxen sodium (ALEVE) 220 MG tablet Take 440 mg by mouth daily as needed (headache/pain).     thiamine (VITAMIN B-1) 50 MG tablet Take 50 mg by mouth daily.     No current facility-administered medications on file prior to visit.    Allergies  Allergen Reactions   Lipitor [Atorvastatin] Swelling    Causes lip to swell    Assessment/Plan:  1. Hyperlipidemia - LDL 109 on ezetimibe 10mg  daily, above goal < 70 given hx of TIA. Pt previously experienced lip swelling after taking atorvastatin for 1-2 weeks. No breathing issues, no other swelling noted. Will rechallenge with low dose of rosuvastatin 10mg  daily. Pt aware to look out for any swelling and stop his rosuvastatin if this occurs. Did not discuss PCSK9i today - pt out of many  medications and I am unsure of complete medication comprehension. Not sure how feasible injectable therapy would be.   2. Hypertension - BP very elevated today at 182/100, far above goal < 130/87mmHg. Pt could not be reached despite multiple attempts after last visit with Dayna to start irbesartan. He is also out of multiple meds today including amlodipine and metoprolol which did not have any remaining refills. Pt is also using 1-2 Aleve daily which is not ideal given his CKD and HTN. Will start irbesartan 150mg  daily as was planned after last APP visit for HTN and CKD benefit. Sent in refill on amlodipine 10mg  daily. Refilled Toprol but at higher dose of 25mg  daily. Refilled HCTZ 25mg  daily for now. Once BP better controlled in the future, will plan to stop this if able due to increased risk of gout flares which pt recently experienced. Pt advised to stop Aleve and can use Tylenol as needed for pain. Encouraged to limit to 000mg  daily sodium.  F/u in 3 weeks for BP check and BMET. Advised pt to bring in home medications to that visit.  **Pt should have 90 day rx with a year's worth of refills sent in on maintenance meds to help with adherence and avoid future issues with pt running out of multiple rx without new rx being sent in.  Tangy Drozdowski E. Cressie Betzler, PharmD, BCACP, [facility] [address], [place], [place] [address] Phone: [phone]; Fax: [phone] 07/30/2021 11:00 AM

## 2021-07-30 NOTE — Patient Instructions (Addendum)
It was nice to see you today  Your blood pressure goal is < 130/71mmHg Restart amlodipine 10mg  - 1 tablet daily Restart metoprolol 25mg  - higher dose of 1 tablet daily Start irbesartan 150mg  - 1 tablet once daily Continue HCTZ 25mg  - 1 tablet once daily  Stop taking your Aleve, it can damage your kidneys. You can take Tylenol instead as needed for pain  Limit your salt intake to < 2,000mg  daily  Your LDL cholesterol is 109 and your goal is < 70 Start rosuvastatin 10mg  - 1 tablet daily **monitor for any swelling in your lips and stop taking rosuvastatin if this happens Continue your ezetimibe - 1 tablet daily  Follow up in 3 weeks for blood pressure check. Please bring in your home medications to this visit

## 2021-08-03 ENCOUNTER — Telehealth: Payer: Self-pay | Admitting: Physician Assistant

## 2021-08-03 NOTE — Telephone Encounter (Signed)
° °  Hi triage team, When I last saw this patient at his recent OV I had recommended moving forward with the echo that was ordered by Dr. Gasper Sells at a prior OV. He has since seen our pharmD team and will follow with them closely. However, I do not see that he scheduled his echo still yet. Can you please touch base with pt about this? Thank you! Melina Copa

## 2021-08-20 ENCOUNTER — Ambulatory Visit: Payer: Self-pay | Admitting: Pharmacist

## 2021-08-20 NOTE — Progress Notes (Unsigned)
Patient ID: Carlos Hayes                 DOB: 1961/06/19                    MRN: [id number]     HPI: Rebel Willcutt is a 61 y.o. male patient of Dr Gasper Sells referred to PharmD clinic for lipid and HTN management by Melina Copa, PA. PMH is significant for HTN, HLD, TIA, CKD stage III, aortic insufficiency, and gout. Echo 09/2020 showed EF 55-60% (no show for most recent ECHO), moderate LV hypertrophy with G2DD. After most recent visit with Dayna, he was advised to start irbesartan however could not be reached despite at least 4 phone call attempts.  At previous visit, LDL 109 on ezetimibe 10mg  daily was above goal < 70 given hx of TIA. Pt previously experienced lip swelling after taking atorvastatin for 1-2 weeks. No breathing issues, no other swelling noted. Planned to rechallenge with low dose of rosuvastatin 10mg  daily. Pt was aware to look out for any swelling and stop his rosuvastatin if this occurs. Didn't discuss PCSK9i - pt out of many medications and unclear of pt's medication comprehension.   BP was also elevated at 182/100, far above goal < 130/108mmHg. Had a headache yesterday, otherwise denies LE edema, blurred vision, and headaches. Pt could not be reached despite multiple attempts after last visit with Dayna to start irbesartan. He was out of multiple meds including amlodipine and metoprolol for 1-2 weeks which did not have any remaining refills. Pt was also using 1-2 Aleve daily (+ colchicine as needed for gout) which was not ideal given his CKD and HTN. Started irbesartan 150mg  daily as was planned after last APP visit for HTN and CKD benefit. Sent in refill on amlodipine 10mg  daily. Refilled Toprol but at higher dose of 25mg  daily. Refilled HCTZ 25mg  daily for now. Sent in 90 day refills to increase adherence. Once BP better controlled in the future, will plan to stop this if able due to increased risk of gout flares which pt recently experienced. Pt advised to stop Aleve and can  use Tylenol as needed for pain. Encouraged to limit to 000mg  daily sodium. F/u in 3 weeks for BP check and BMET. Advised pt to bring in home medications to that visit.  Today,  - assess rosuvastatin 10 - lip swelling? - get lipid panel in 6-8 weeks - adherence to other meds - check BP technique - assess BMET labs post visit -- incr irbesartan after?  Current lipid meds: ezetimibe 10mg  daily, rosuvastatin 10 mg daily, metoprolol succinate 25 mg daily, amlodipine 10 mg daily, irbesartan 150 mg daily, HCTZ 25 mg daily  Intolerances: atorvastatin 80mg  daily- (lip swelling after taking for 1-2 weeks, unclear if could have been concurrent lisinopril) Risk Factors: TIA, CKD LDL goal: 70mg /dL  Current BP meds: amlodipine 10mg  daily, HCTZ 25mg  daily, Toprol 12.5mg  daily BP goal: < 130/43mmHg  Diet: 1 cup of coffee and tea a day,   Exercise: Works in Information systems manager  Family History  Problem Relation Age of Onset   Heart disease Neg Hx    Social History: Denies tobacco and drug use.  Labs: 06/30/21: TC 181, TG 126, HDL 50, LDL 109 (ezetimibe 10mg  daily) SCr 1.65, Na 137, K 3.9  Past Medical History:  Diagnosis Date   Aortic insufficiency    Chronic kidney disease, stage 3 (HCC)    Gout    Hyperlipidemia  Hypertension    TIA (transient ischemic attack)     Current Outpatient Medications on File Prior to Visit  Medication Sig Dispense Refill   amLODipine (NORVASC) 10 MG tablet Take 1 tablet (10 mg total) by mouth daily. 90 tablet 3   APPLE CIDER VINEGAR PO Take 1 capsule by mouth daily.     aspirin 81 MG EC tablet TAKE 1 TABLET (81 MG TOTAL) BY MOUTH DAILY. SWALLOW WHOLE. 90 tablet 1   colchicine 0.6 MG tablet TAKE 1 TABLET (0.6 MG TOTAL) BY MOUTH DAILY AS NEEDED (GOUT). 30 tablet 2   ezetimibe (ZETIA) 10 MG tablet Take 1 tablet (10 mg total) by mouth daily. 90 tablet 3   hydrochlorothiazide (HYDRODIURIL) 25 MG tablet Take 1 tablet (25 mg total) by mouth daily.  90 tablet 3   irbesartan (AVAPRO) 150 MG tablet Take 1 tablet (150 mg total) by mouth daily. 30 tablet 5   metoprolol succinate (TOPROL-XL) 25 MG 24 hr tablet Take 1 tablet (25 mg total) by mouth daily. 90 tablet 3   rosuvastatin (CRESTOR) 10 MG tablet Take 1 tablet (10 mg total) by mouth daily. 30 tablet 5   thiamine (VITAMIN B-1) 50 MG tablet Take 50 mg by mouth daily.     No current facility-administered medications on file prior to visit.    Allergies  Allergen Reactions   Lipitor [Atorvastatin] Swelling    Causes lip to swell    Assessment/Plan:  1. Hyperlipidemia - LDL 109 on ezetimibe 10mg  daily, above goal < 70 given hx of TIA. Pt previously experienced lip swelling after taking atorvastatin for 1-2 weeks. No breathing issues, no other swelling noted. Will rechallenge with low dose of rosuvastatin 10mg  daily. Pt aware to look out for any swelling and stop his rosuvastatin if this occurs. Did not discuss PCSK9i today - pt out of many medications and I am unsure of complete medication comprehension. Not sure how feasible injectable therapy would be.   2. Hypertension - BP very elevated today at 182/100, far above goal < 130/58mmHg. Pt could not be reached despite multiple attempts after last visit with Dayna to start irbesartan. He is also out of multiple meds today including amlodipine and metoprolol which did not have any remaining refills. Pt is also using 1-2 Aleve daily which is not ideal given his CKD and HTN. Will start irbesartan 150mg  daily as was planned after last APP visit for HTN and CKD benefit. Sent in refill on amlodipine 10mg  daily. Refilled Toprol but at higher dose of 25mg  daily. Refilled HCTZ 25mg  daily for now. Once BP better controlled in the future, will plan to stop this if able due to increased risk of gout flares which pt recently experienced. Pt advised to stop Aleve and can use Tylenol as needed for pain. Encouraged to limit to 000mg  daily sodium.  F/u in 3  weeks for BP check and BMET. Advised pt to bring in home medications to that visit.  **Pt should have 90 day rx with a year's worth of refills sent in on maintenance meds to help with adherence and avoid future issues with pt running out of multiple rx without new rx being sent in.  Megan E. Supple, PharmD, BCACP, [facility] [address] [place], [place] [address] Phone: [phone]; Fax: [phone] 08/20/2021 1:37 PM

## 2021-09-17 ENCOUNTER — Ambulatory Visit: Payer: 59 | Admitting: Internal Medicine

## 2021-09-17 NOTE — Progress Notes (Deleted)
Cardiology Office Note:    Date:  09/17/2021   ID:  Carlos Hayes, DOB 1961-03-19, MRN [id number]  PCP:  Patient, No Pcp Per (Inactive)   [place]  Cardiologist:  Werner Lean, MD  Advanced Practice Provider:  No care team member to display Electrophysiologist:  None    CC: HTN and AI follow up  History of Present Illness:    Carlos Hayes is a 61 y.o. male with a hx of HTN, HLD with prior TIA and query or atorvastatin lip swelling, moderate to severe aortic regurgitation, CKD Stage IIIb, who has had sporadic follow up.  Saw PA Dunn and Pharm D clinic.  ***  Patient notes that he is doing ***.   Since day prior/last visit notes *** . There are no*** interval hospital/ED visit.    No chest pain or pressure ***.  No SOB/DOE*** and no PND/Orthopnea***.  No weight gain or leg swelling***.  No palpitations or syncope ***.  Ambulatory blood pressure ***.    Past Medical History:  Diagnosis Date   Aortic insufficiency    Chronic kidney disease, stage 3 (HCC)    Gout    Hyperlipidemia    Hypertension    TIA (transient ischemic attack)     No past surgical history on file.  Current Medications: No outpatient medications have been marked as taking for the 09/17/21 encounter (Appointment) with Werner Lean, MD.     Allergies:   Lipitor [atorvastatin]   Social History   Socioeconomic History   Marital status: Single    Spouse name: Not on file   Number of children: Not on file   Years of education: Not on file   Highest education level: Not on file  Occupational History   Not on file  Tobacco Use   Smoking status: Never   Smokeless tobacco: Never  Vaping Use   Vaping Use: Never used  Substance and Sexual Activity   Alcohol use: Yes   Drug use: No   Sexual activity: Not on file  Other Topics Concern   Not on file  Social History Narrative   Not on file   Social Determinants of Health   Financial Resource  Strain: Not on file  Food Insecurity: Not on file  Transportation Needs: Not on file  Physical Activity: Not on file  Stress: Not on file  Social Connections: Not on file     Family History: History of coronary artery disease notable for no members. History of heart failure notable for no members. History of arrhythmia notable for no members.  ROS:   Please see the history of present illness.     All other systems reviewed and are negative.  EKGs/Labs/Other Studies Reviewed:    The following studies were reviewed today:   EKG:   09/26/20: SR 1st HN rate 82 LVH  Transthoracic Echocardiogram: Date: 09/27/20 Results: Moderate to  severe AI 1. Left ventricular ejection fraction, by estimation, is 55 to 60%. The  left ventricle has normal function. Left ventricular endocardial border  not optimally defined to evaluate regional wall motion. There is moderate  left ventricular hypertrophy. Left  ventricular diastolic parameters are consistent with Grade II diastolic  dysfunction (pseudonormalization).   2. Right ventricular systolic function is normal. The right ventricular  size is normal. Tricuspid regurgitation signal is inadequate for assessing  PA pressure.   3. Left atrial size was moderately dilated.   4. Right atrial size was mild to moderately dilated.  5. The mitral valve is normal in structure. Trivial mitral valve  regurgitation. No evidence of mitral stenosis.   6. The aortic valve is abnormal. Aortic valve regurgitation is moderate  to moderate-severe. No aortic stenosis is present. Aortic regurgitation  PHT measures 364 msec. Note BP at time of study is 187/94 mmHg. AV appears  mildly degenerative, no definite  leaflet perforation noted. Jet of AR is eccentric and directed toward  anterior mitral leaflet. Quantitation challenging with eccentric jet.   7. The inferior vena cava is normal in size with greater than 50%  respiratory variability, suggesting right  atrial pressure of 3 mmHg.   8. Cannot exclude atrial level shunt.     Recent Labs: 01/29/2021: Hemoglobin 13.2; Platelets 202 06/30/2021: ALT 26; BUN 22; Creatinine, Ser 1.65; Potassium 3.9; Sodium 137; TSH 1.130  Recent Lipid Panel    Component Value Date/Time   CHOL 181 06/30/2021 1631   TRIG 126 06/30/2021 1631   HDL 50 06/30/2021 1631   CHOLHDL 3.6 06/30/2021 1631   CHOLHDL 4.2 09/27/2020 0111   VLDL 15 09/27/2020 0111   LDLCALC 109 (H) 06/30/2021 1631   LDLDIRECT 106 (H) 06/30/2021 1631    Physical Exam:    VS:  There were no vitals taken for this visit.    Wt Readings from Last 3 Encounters:  06/30/21 102 kg  02/24/21 101.9 kg  01/29/21 103.9 kg    Gen: *** distress, *** obese/well nourished/malnourished   Neck: No JVD, *** carotid bruit Ears: Pilar Plate Sign Cardiac: No Rubs or Gallops, *** Murmur, ***cardia, *** radial pulses Respiratory: Clear to auscultation bilaterally, *** effort, ***  respiratory rate GI: Soft, nontender, non-distended *** MS: No *** edema; *** moves all extremities Integument: Skin feels *** Neuro:  At time of evaluation, alert and oriented to person/place/time/situation *** Psych: Normal affect, patient feels ***   ASSESSMENT:    No diagnosis found.  PLAN:    In order of problems listed above:  Moderate Aortic Insufficiency HTN TIA vs HTN emergency - will increase HCTZ to 25 mg PO Daily and check BMP in one week - will continue other HTN agents - will continue amb BP monitoring - will see in HTN clinic - continue ASA - will get echo, he did not get his prior echo's as scheduled    Medication Adjustments/Labs and Tests Ordered: Current medicines are reviewed at length with the patient today.  Concerns regarding medicines are outlined above.  No orders of the defined types were placed in this encounter.  No orders of the defined types were placed in this encounter.   There are no Patient Instructions on file for this  visit.   Signed, Werner Lean, MD  09/17/2021 8:01 AM    [place]

## 2021-10-28 ENCOUNTER — Other Ambulatory Visit: Payer: Self-pay | Admitting: Physician Assistant

## 2021-10-28 DIAGNOSIS — G459 Transient cerebral ischemic attack, unspecified: Secondary | ICD-10-CM

## 2022-01-21 ENCOUNTER — Other Ambulatory Visit: Payer: Self-pay | Admitting: Internal Medicine

## 2022-02-05 ENCOUNTER — Other Ambulatory Visit: Payer: Self-pay | Admitting: Internal Medicine

## 2022-02-05 DIAGNOSIS — I10 Essential (primary) hypertension: Secondary | ICD-10-CM

## 2022-02-05 DIAGNOSIS — G459 Transient cerebral ischemic attack, unspecified: Secondary | ICD-10-CM

## 2022-04-21 ENCOUNTER — Ambulatory Visit: Payer: 59 | Admitting: Critical Care Medicine

## 2022-04-21 NOTE — Progress Notes (Deleted)
New Patient Office Visit  Subjective    Patient ID: Carlos Hayes, male    DOB: [birth year]  Age: 61 y.o. MRN: [id number]  CC: No chief complaint on file.   HPI Carlos Hayes presents to establish care  Last seen 01/2021 by PA Thereasa Solo Ira Dougher is a 61 y.o. male here today to establish care and for a follow up visit After being seen at [facility] for med RF 12/13/2020  no complaints or new issues today.  Lisinopril is being held due to abnormal kidney function.  No CP.  No SOB.  Patient did NOT take meds today as he is out.      From U/C note: From A/P:      amLODipine (NORVASC) 10 MG tablet      Sig: Take 1 tablet (10 mg total) by mouth daily.      Dispense:  30 tablet      Refill:  1   aspirin 81 MG EC tablet      Sig: Take 1 tablet (81 mg total) by mouth daily. Swallow whole.      Dispense:  30 tablet      Refill:  1   ezetimibe (ZETIA) 10 MG tablet      Sig: Take 1 tablet (10 mg total) by mouth daily.      Dispense:  90 tablet      Refill:  3   metoprolol succinate (TOPROL-XL) 25 MG 24 hr tablet      Sig: Take 0.5 tablets (12.5 mg total) by mouth daily.      Dispense:  30 tablet      Refill:  0    Patient is stable at discharge.  Medication was refilled except atorvastatin  as he is allergic to it.   Discharge instructions   Follow-up and establish care with PCP. PCP resource was provided Medication was refilled.   ED/Hospital notes reviewed.     1. Hypertension, unspecified type Uncontrolled but didn't take meds today.  Take meds as dited.  Will hold lisinopril for now given Cr. - amLODipine (NORVASC) 10 MG tablet; Take 1 tablet (10 mg total) by mouth daily.  Dispense: 30 tablet; Refill: 3 - hydrochlorothiazide (HYDRODIURIL) 25 MG tablet; Take 1 tablet (25 mg total) by mouth daily.  Dispense: 90 tablet; Refill: 3 - metoprolol succinate (TOPROL-XL) 25 MG 24 hr tablet; Take 0.5 tablets (12.5 mg total) by mouth daily.  Dispense: 30 tablet; Refill: 3 - CBC  with Differential/Platelet   2. Encounter for examination following treatment at hospital   3. Abnormal kidney function - Comprehensive metabolic panel - CBC with Differential/Platelet   4. Prediabetes I have had a lengthy discussion and provided education about insulin resistance and the intake of too much sugar/refined carbohydrates.  I have advised the patient to work at a goal of eliminating sugary drinks, candy, desserts, sweets, refined sugars, processed foods, and white carbohydrates.  The patient expresses understanding.    - ezetimibe (ZETIA) 10 MG tablet; Take 1 tablet (10 mg total) by mouth daily.  Dispense: 90 tablet; Refill: 3 - Lipid panel   5. TIA (transient ischemic attack) - aspirin 81 MG EC tablet; Take 1 tablet (81 mg total) by mouth daily. Swallow whole.  Dispense: 30 tablet; Refill: 3 - hydrochlorothiazide (HYDRODIURIL) 25 MG tablet; Take 1 tablet (25 mg total) by mouth daily.  Dispense: 90 tablet; Refill: 3 - metoprolol succinate (TOPROL-XL) 25 MG 24 hr tablet; Take 0.5 tablets (12.5 mg total) by mouth  daily.  Dispense: 30 tablet; Refill: 3 - CBC with Differential/Platelet   6. Hyperlipidemia, unspecified hyperlipidemia type - ezetimibe (ZETIA) 10 MG tablet; Take 1 tablet (10 mg total) by mouth daily.  Dispense: 90 tablet; Refill: 3 - Lipid panel   7. Gout, unspecified cause, unspecified chronicity, unspecified site - colchicine 0.6 MG tablet; Take 1 tablet (0.6 mg total) by mouth daily as needed (gout).  Dispense: 30 tablet; Refill: 2        Cards Pharm D OV 07/2021 Carlos Hayes is a 61 y.o. male patient of Dr Gasper Sells referred to PharmD clinic for lipid and HTN management by Melina Copa, PA. PMH is significant for HTN, HLD, TIA, CKD stage III, aortic insufficiency, and gout. Echo 09/2020 showed EF 55-60%, moderate LV hypertrophy with G2DD. After most recent visit with Dayna, he was advised to start irbesartan however could not be reached despite at least  4 phone call attempts.   Pt presents today for follow up. Has been out of his metoprolol for the past week and amlodipine for the past 2 weeks. Does not look like his prescription had remaining refills and rx were never sent back in. Takes 1-2 Aleve every day. Uses colchicine as needed for gout, took some the other day for gout flare in his toe. Recalls 1 BP reading he checked using the BP cuff at [facility], doesn't have a cuff at home. Reading was elevated at 175/70-80s. This was before he ran out of his meds. Had a headache yesterday, otherwise denies LE edema, blurred vision, and headaches.    Tried to clarify lip swelling with atorvastatin as this was started at the same time that lisinopril was which is more likely to cause facial swelling. Pt reports he noticed the swelling in his lips after taking atorvastatin for 1-2 weeks. He stopped the atorvastatin at home, didn't go the ED, no trouble breathing and no other swelling that he noted, and sx resolved. Thinks he stayed on lisinopril a bit longer (also had issues with not enough refills being given of these so he stopped it after the initial 1-2 fills).   Current lipid meds: ezetimibe '10mg'$  daily Intolerances: atorvastatin '80mg'$  daily- lip swelling Risk Factors: TIA, CKD LDL goal: '70mg'$ /dL   Current BP meds: amlodipine '10mg'$  daily, HCTZ '25mg'$  daily, Toprol 12.'5mg'$  daily BP goal: < 130/67mHg   Diet: 1 cup of coffee and tea a day,    Exercise: Works in hInformation systems manager Assessment/Plan:   1. Hyperlipidemia - LDL 109 on ezetimibe '10mg'$  daily, above goal < 70 given hx of TIA. Pt previously experienced lip swelling after taking atorvastatin for 1-2 weeks. No breathing issues, no other swelling noted. Will rechallenge with low dose of rosuvastatin '10mg'$  daily. Pt aware to look out for any swelling and stop his rosuvastatin if this occurs. Did not discuss PCSK9i today - pt out of many medications and I am unsure of complete medication  comprehension. Not sure how feasible injectable therapy would be.    2. Hypertension - BP very elevated today at 182/100, far above goal < 130/871mg. Pt could not be reached despite multiple attempts after last visit with Dayna to start irbesartan. He is also out of multiple meds today including amlodipine and metoprolol which did not have any remaining refills. Pt is also using 1-2 Aleve daily which is not ideal given his CKD and HTN. Will start irbesartan '150mg'$  daily as was planned after last APP visit for HTN and CKD benefit. Sent in  refill on amlodipine '10mg'$  daily. Refilled Toprol but at higher dose of '25mg'$  daily. Refilled HCTZ '25mg'$  daily for now. Once BP better controlled in the future, will plan to stop this if able due to increased risk of gout flares which pt recently experienced. Pt advised to stop Aleve and can use Tylenol as needed for pain. Encouraged to limit to '000mg'$  daily sodium.   F/u in 3 weeks for BP check and BMET. Advised pt to bring in home medications to that visit.   **Pt should have 90 day rx with a year's worth of refills sent in on maintenance meds to help with adherence and avoid future issues with pt running out of multiple rx without new rx being sent in.   Megan E. Supple, PharmD, BCACP, CPP Outpatient Encounter Medications as of 04/21/2022  Medication Sig   amLODipine (NORVASC) 10 MG tablet Take 1 tablet (10 mg total) by mouth daily.   APPLE CIDER VINEGAR PO Take 1 capsule by mouth daily.   aspirin (ASPIRIN LOW DOSE) 81 MG EC tablet TAKE 1 TABLET (81 MG TOTAL) BY MOUTH DAILY. SWALLOW WHOLE.   colchicine 0.6 MG tablet TAKE 1 TABLET (0.6 MG TOTAL) BY MOUTH DAILY AS NEEDED (GOUT).   ezetimibe (ZETIA) 10 MG tablet Take 1 tablet (10 mg total) by mouth daily.   hydrochlorothiazide (HYDRODIURIL) 25 MG tablet Take 1 tablet (25 mg total) by mouth daily.   irbesartan (AVAPRO) 150 MG tablet Take 1 tablet (150 mg total) by mouth daily.   metoprolol succinate (TOPROL-XL) 25 MG  24 hr tablet TAKE 1/2 TABLET BY MOUTH EVERY DAY   rosuvastatin (CRESTOR) 10 MG tablet TAKE 1 TABLET BY MOUTH EVERY DAY   thiamine (VITAMIN B-1) 50 MG tablet Take 50 mg by mouth daily.   No facility-administered encounter medications on file as of 04/21/2022.    Past Medical History:  Diagnosis Date   Aortic insufficiency    Chronic kidney disease, stage 3 (HCC)    Gout    Hyperlipidemia    Hypertension    TIA (transient ischemic attack)     No past surgical history on file.  Family History  Problem Relation Age of Onset   Heart disease Neg Hx     Social History   Socioeconomic History   Marital status: Single    Spouse name: Not on file   Number of children: Not on file   Years of education: Not on file   Highest education level: Not on file  Occupational History   Not on file  Tobacco Use   Smoking status: Never   Smokeless tobacco: Never  Vaping Use   Vaping Use: Never used  Substance and Sexual Activity   Alcohol use: Yes   Drug use: No   Sexual activity: Not on file  Other Topics Concern   Not on file  Social History Narrative   Not on file   Social Determinants of Health   Financial Resource Strain: Not on file  Food Insecurity: Not on file  Transportation Needs: Not on file  Physical Activity: Not on file  Stress: Not on file  Social Connections: Not on file  Intimate Partner Violence: Not on file    ROS      Objective    There were no vitals taken for this visit.  Physical Exam  {Labs (Optional):23779}    Assessment & Plan:   Problem List Items Addressed This Visit   None   No follow-ups on file.  Asencion Noble, MD

## 2022-07-08 ENCOUNTER — Encounter: Payer: Self-pay | Admitting: Nurse Practitioner

## 2022-07-08 ENCOUNTER — Ambulatory Visit: Payer: 59 | Admitting: Nurse Practitioner

## 2022-07-08 VITALS — BP 136/74 | HR 84 | Temp 98.5°F | Ht 67.0 in | Wt 227.6 lb

## 2022-07-08 DIAGNOSIS — Z1159 Encounter for screening for other viral diseases: Secondary | ICD-10-CM

## 2022-07-08 DIAGNOSIS — I10 Essential (primary) hypertension: Secondary | ICD-10-CM

## 2022-07-08 DIAGNOSIS — Z13220 Encounter for screening for lipoid disorders: Secondary | ICD-10-CM | POA: Diagnosis not present

## 2022-07-08 DIAGNOSIS — G459 Transient cerebral ischemic attack, unspecified: Secondary | ICD-10-CM

## 2022-07-08 DIAGNOSIS — Z1211 Encounter for screening for malignant neoplasm of colon: Secondary | ICD-10-CM

## 2022-07-08 MED ORDER — METOPROLOL SUCCINATE ER 25 MG PO TB24: 12.5000 mg | ORAL_TABLET | Freq: Every day | ORAL | 2 refills | Status: DC

## 2022-07-08 NOTE — Assessment & Plan Note (Signed)
-   metoprolol succinate (TOPROL-XL) 25 MG 24 hr tablet; Take 0.5 tablets (12.5 mg total) by mouth daily.  Dispense: 15 tablet; Refill: 2  2. Hypertension, unspecified type  - CBC with Differential/Platelet - Comprehensive metabolic panel - metoprolol succinate (TOPROL-XL) 25 MG 24 hr tablet; Take 0.5 tablets (12.5 mg total) by mouth daily.  Dispense: 15 tablet; Refill: 2  3. Lipid screening  - Lipid panel  4. Need for hepatitis C screening test  - Hepatitis C antibody  5. Colon cancer screening  - Cologuard    Follow up:  Follow up in 6 months or sooner if needed

## 2022-07-08 NOTE — Progress Notes (Signed)
$'@Patient'U$  ID: Carlos Hayes, male    DOB: [birth year], 61 y.o.   MRN: [id number]  Chief Complaint  Patient presents with   Establish Care    fasting    Referring provider: No ref. provider found   HPI  61 year old male with history of TIA, hypertension, prediabetes, hyperlipidemia.  Patient presents today to establish care.  He states that his PCP recently retired.  Overall patient has been doing well.  He just returned from his job 3 months ago.  He does state that over the past couple weeks he has been having cold symptoms.  He has been taking over-the-counter cold medicine and states that it does feel like it is improving. Denies f/c/s, n/v/d, hemoptysis, PND, leg swelling Denies chest pain or edema        PCP retired    Allergies  Allergen Reactions   Lipitor [Atorvastatin] Swelling    Causes lip to swell     There is no immunization history on file for this patient.  Past Medical History:  Diagnosis Date   Aortic insufficiency    Chronic kidney disease, stage 3 (HCC)    Gout    Hyperlipidemia    Hypertension    TIA (transient ischemic attack)     Tobacco History: Social History   Tobacco Use  Smoking Status Never  Smokeless Tobacco Never   Counseling given: Not Answered   Outpatient Encounter Medications as of 07/08/2022  Medication Sig   amLODipine (NORVASC) 10 MG tablet Take 1 tablet (10 mg total) by mouth daily.   APPLE CIDER VINEGAR PO Take 1 capsule by mouth daily.   aspirin (ASPIRIN LOW DOSE) 81 MG EC tablet TAKE 1 TABLET (81 MG TOTAL) BY MOUTH DAILY. SWALLOW WHOLE.   colchicine 0.6 MG tablet TAKE 1 TABLET (0.6 MG TOTAL) BY MOUTH DAILY AS NEEDED (GOUT).   ezetimibe (ZETIA) 10 MG tablet Take 1 tablet (10 mg total) by mouth daily.   hydrochlorothiazide (HYDRODIURIL) 25 MG tablet Take 1 tablet (25 mg total) by mouth daily.   irbesartan (AVAPRO) 150 MG tablet Take 1 tablet (150 mg total) by mouth daily.   rosuvastatin (CRESTOR) 10 MG  tablet TAKE 1 TABLET BY MOUTH EVERY DAY   metoprolol succinate (TOPROL-XL) 25 MG 24 hr tablet Take 0.5 tablets (12.5 mg total) by mouth daily.   thiamine (VITAMIN B-1) 50 MG tablet Take 50 mg by mouth daily. (Patient not taking: Reported on 07/08/2022)   [DISCONTINUED] metoprolol succinate (TOPROL-XL) 25 MG 24 hr tablet TAKE 1/2 TABLET BY MOUTH EVERY DAY (Patient not taking: Reported on 07/08/2022)   No facility-administered encounter medications on file as of 07/08/2022.     Review of Systems  Review of Systems  Constitutional: Negative.   HENT: Negative.    Cardiovascular: Negative.   Gastrointestinal: Negative.   Allergic/Immunologic: Negative.   Neurological: Negative.   Psychiatric/Behavioral: Negative.         Physical Exam  BP 136/74   Pulse 84   Temp 98.5 F (36.9 C)   Ht '5\' 7"'$  (1.702 m)   Wt 227 lb 9.6 oz (103.2 kg)   SpO2 97%   BMI 35.65 kg/m   Wt Readings from Last 5 Encounters:  07/08/22 227 lb 9.6 oz (103.2 kg)  06/30/21 224 lb 12.8 oz (102 kg)  02/24/21 224 lb 9.6 oz (101.9 kg)  01/29/21 229 lb (103.9 kg)  10/31/20 230 lb (104.3 kg)     Physical Exam Vitals and nursing note reviewed.  Constitutional:  General: He is not in acute distress.    Appearance: He is well-developed.  Cardiovascular:     Rate and Rhythm: Normal rate and regular rhythm.  Pulmonary:     Effort: Pulmonary effort is normal.     Breath sounds: Normal breath sounds.  Skin:    General: Skin is warm and dry.  Neurological:     Mental Status: He is alert and oriented to person, place, and time.       Assessment & Plan:   TIA (transient ischemic attack) - metoprolol succinate (TOPROL-XL) 25 MG 24 hr tablet; Take 0.5 tablets (12.5 mg total) by mouth daily.  Dispense: 15 tablet; Refill: 2  2. Hypertension, unspecified type  - CBC with Differential/Platelet - Comprehensive metabolic panel - metoprolol succinate (TOPROL-XL) 25 MG 24 hr tablet; Take 0.5 tablets (12.5  mg total) by mouth daily.  Dispense: 15 tablet; Refill: 2  3. Lipid screening  - Lipid panel  4. Need for hepatitis C screening test  - Hepatitis C antibody  5. Colon cancer screening  - Cologuard    Follow up:  Follow up in 6 months or sooner if needed     Fenton Foy, NP 07/08/2022

## 2022-07-08 NOTE — Patient Instructions (Addendum)
1. TIA (transient ischemic attack)  - metoprolol succinate (TOPROL-XL) 25 MG 24 hr tablet; Take 0.5 tablets (12.5 mg total) by mouth daily.  Dispense: 15 tablet; Refill: 2  2. Hypertension, unspecified type  - CBC with Differential/Platelet - Comprehensive metabolic panel - metoprolol succinate (TOPROL-XL) 25 MG 24 hr tablet; Take 0.5 tablets (12.5 mg total) by mouth daily.  Dispense: 15 tablet; Refill: 2  3. Lipid screening  - Lipid panel  4. Need for hepatitis C screening test  - Hepatitis C antibody  5. Colon cancer screening  - Cologuard    Follow up:  Follow up in 6 months or sooner if needed

## 2022-07-09 LAB — COMPREHENSIVE METABOLIC PANEL
ALT: 33 IU/L (ref 0–44)
AST: 21 IU/L (ref 0–40)
Albumin/Globulin Ratio: 1.3 (ref 1.2–2.2)
Albumin: 4.6 g/dL (ref 3.8–4.9)
Alkaline Phosphatase: 88 IU/L (ref 44–121)
BUN/Creatinine Ratio: 14 (ref 9–20)
BUN: 25 mg/dL — ABNORMAL HIGH (ref 6–24)
Bilirubin Total: 0.5 mg/dL (ref 0.0–1.2)
CO2: 26 mmol/L (ref 20–29)
Calcium: 9.9 mg/dL (ref 8.7–10.2)
Chloride: 101 mmol/L (ref 96–106)
Creatinine, Ser: 1.73 mg/dL — ABNORMAL HIGH (ref 0.76–1.27)
Globulin, Total: 3.5 g/dL (ref 1.5–4.5)
Glucose: 83 mg/dL (ref 70–99)
Potassium: 3.7 mmol/L (ref 3.5–5.2)
Sodium: 142 mmol/L (ref 134–144)
Total Protein: 8.1 g/dL (ref 6.0–8.5)
eGFR: 45 mL/min/{1.73_m2} — ABNORMAL LOW (ref >59)

## 2022-07-09 LAB — LIPID PANEL
Chol/HDL Ratio: 3.2 ratio (ref 0.0–5.0)
Cholesterol, Total: 133 mg/dL (ref 100–199)
HDL: 41 mg/dL (ref >39)
LDL Chol Calc (NIH): 74 mg/dL (ref 0–99)
Triglycerides: 92 mg/dL (ref 0–149)
VLDL Cholesterol Cal: 18 mg/dL (ref 5–40)

## 2022-07-09 LAB — CBC WITH DIFFERENTIAL/PLATELET
Basophils Absolute: 0 10*3/uL (ref 0.0–0.2)
Basos: 1 %
EOS (ABSOLUTE): 0.2 10*3/uL (ref 0.0–0.4)
Eos: 3 %
Hematocrit: 43.6 % (ref 37.5–51.0)
Hemoglobin: 14.1 g/dL (ref 13.0–17.7)
Immature Grans (Abs): 0 10*3/uL (ref 0.0–0.1)
Immature Granulocytes: 0 %
Lymphocytes Absolute: 1 10*3/uL (ref 0.7–3.1)
Lymphs: 19 %
MCH: 26.1 pg — ABNORMAL LOW (ref 26.6–33.0)
MCHC: 32.3 g/dL (ref 31.5–35.7)
MCV: 81 fL (ref 79–97)
Monocytes Absolute: 0.9 10*3/uL (ref 0.1–0.9)
Monocytes: 19 %
Neutrophils Absolute: 2.8 10*3/uL (ref 1.4–7.0)
Neutrophils: 58 %
Platelets: 260 10*3/uL (ref 150–450)
RBC: 5.4 x10E6/uL (ref 4.14–5.80)
RDW: 12.9 % (ref 11.6–15.4)
WBC: 4.9 10*3/uL (ref 3.4–10.8)

## 2022-07-09 LAB — HEPATITIS C ANTIBODY: Hep C Virus Ab: NONREACTIVE

## 2022-07-21 ENCOUNTER — Other Ambulatory Visit: Payer: Self-pay | Admitting: Internal Medicine

## 2022-08-09 HISTORY — PX: COLONOSCOPY: SHX174

## 2022-08-15 LAB — COLOGUARD: COLOGUARD: NEGATIVE

## 2022-08-17 ENCOUNTER — Other Ambulatory Visit: Payer: Self-pay | Admitting: Internal Medicine

## 2022-08-18 ENCOUNTER — Other Ambulatory Visit: Payer: Self-pay | Admitting: Internal Medicine

## 2022-08-18 DIAGNOSIS — G459 Transient cerebral ischemic attack, unspecified: Secondary | ICD-10-CM

## 2022-08-18 DIAGNOSIS — I10 Essential (primary) hypertension: Secondary | ICD-10-CM

## 2022-08-27 ENCOUNTER — Other Ambulatory Visit: Payer: Self-pay | Admitting: Internal Medicine

## 2022-08-27 DIAGNOSIS — I10 Essential (primary) hypertension: Secondary | ICD-10-CM

## 2022-08-27 DIAGNOSIS — G459 Transient cerebral ischemic attack, unspecified: Secondary | ICD-10-CM

## 2022-09-05 ENCOUNTER — Other Ambulatory Visit: Payer: Self-pay | Admitting: Internal Medicine

## 2022-09-10 ENCOUNTER — Other Ambulatory Visit: Payer: Self-pay | Admitting: Internal Medicine

## 2022-09-10 DIAGNOSIS — G459 Transient cerebral ischemic attack, unspecified: Secondary | ICD-10-CM

## 2022-09-10 DIAGNOSIS — I10 Essential (primary) hypertension: Secondary | ICD-10-CM

## 2022-09-14 ENCOUNTER — Other Ambulatory Visit: Payer: Self-pay

## 2022-09-14 DIAGNOSIS — G459 Transient cerebral ischemic attack, unspecified: Secondary | ICD-10-CM

## 2022-09-14 DIAGNOSIS — I10 Essential (primary) hypertension: Secondary | ICD-10-CM

## 2022-09-14 MED ORDER — METOPROLOL SUCCINATE ER 25 MG PO TB24: 12.5000 mg | ORAL_TABLET | Freq: Every day | ORAL | 0 refills | Status: DC

## 2022-09-14 MED ORDER — AMLODIPINE BESYLATE 10 MG PO TABS: 10.0000 mg | ORAL_TABLET | Freq: Every day | ORAL | 0 refills | Status: DC

## 2022-09-14 MED ORDER — ROSUVASTATIN CALCIUM 10 MG PO TABS: 10.0000 mg | ORAL_TABLET | Freq: Every day | ORAL | 0 refills | Status: DC

## 2022-09-14 MED ORDER — IRBESARTAN 150 MG PO TABS: 150.0000 mg | ORAL_TABLET | Freq: Every day | ORAL | 0 refills | Status: DC

## 2022-09-14 MED ORDER — HYDROCHLOROTHIAZIDE 25 MG PO TABS: 25.0000 mg | ORAL_TABLET | Freq: Every day | ORAL | 0 refills | Status: DC

## 2022-09-15 ENCOUNTER — Ambulatory Visit: Payer: 59 | Attending: Internal Medicine | Admitting: Internal Medicine

## 2022-09-15 ENCOUNTER — Encounter: Payer: Self-pay | Admitting: Internal Medicine

## 2022-09-15 VITALS — BP 146/76 | HR 62 | Ht 67.0 in | Wt 231.0 lb

## 2022-09-15 DIAGNOSIS — N183 Chronic kidney disease, stage 3 unspecified: Secondary | ICD-10-CM | POA: Diagnosis not present

## 2022-09-15 DIAGNOSIS — R7303 Prediabetes: Secondary | ICD-10-CM

## 2022-09-15 DIAGNOSIS — E782 Mixed hyperlipidemia: Secondary | ICD-10-CM

## 2022-09-15 DIAGNOSIS — G459 Transient cerebral ischemic attack, unspecified: Secondary | ICD-10-CM

## 2022-09-15 DIAGNOSIS — I351 Nonrheumatic aortic (valve) insufficiency: Secondary | ICD-10-CM | POA: Diagnosis not present

## 2022-09-15 DIAGNOSIS — E785 Hyperlipidemia, unspecified: Secondary | ICD-10-CM

## 2022-09-15 DIAGNOSIS — I10 Essential (primary) hypertension: Secondary | ICD-10-CM | POA: Diagnosis not present

## 2022-09-15 MED ORDER — METOPROLOL SUCCINATE ER 25 MG PO TB24: 12.5000 mg | ORAL_TABLET | Freq: Every day | ORAL | 3 refills | Status: DC

## 2022-09-15 MED ORDER — ROSUVASTATIN CALCIUM 10 MG PO TABS: 10.0000 mg | ORAL_TABLET | Freq: Every day | ORAL | 3 refills | Status: DC

## 2022-09-15 MED ORDER — EZETIMIBE 10 MG PO TABS: 10.0000 mg | ORAL_TABLET | Freq: Every day | ORAL | 3 refills | Status: DC

## 2022-09-15 MED ORDER — COLCHICINE 0.6 MG PO TABS: 0.6000 mg | ORAL_TABLET | Freq: Every day | ORAL | 2 refills | Status: DC | PRN

## 2022-09-15 MED ORDER — IRBESARTAN 300 MG PO TABS: 300.0000 mg | ORAL_TABLET | Freq: Every day | ORAL | 3 refills | Status: DC

## 2022-09-15 MED ORDER — AMLODIPINE BESYLATE 10 MG PO TABS: 10.0000 mg | ORAL_TABLET | Freq: Every day | ORAL | 3 refills | Status: DC

## 2022-09-15 MED ORDER — HYDROCHLOROTHIAZIDE 25 MG PO TABS: 25.0000 mg | ORAL_TABLET | Freq: Every day | ORAL | 3 refills | Status: DC

## 2022-09-15 NOTE — Patient Instructions (Signed)
Medication Instructions:  Your physician has recommended you make the following change in your medication:  INCREASED: irbesartan (Avapro) to 300 mg by mouth once daily  START: Aspirin 81 mg by mouth once daily REFILLED: all cardiac medications  *If you need a refill on your cardiac medications before your next appointment, please call your pharmacy*   Lab Work: IN 1 WEEK: BMP  If you have labs (blood work) drawn today and your tests are completely normal, you will receive your results only by: [place] (if you have MyChart) OR A paper copy in the mail If you have any lab test that is abnormal or we need to change your treatment, we will call you to review the results.   Testing/Procedures: Your physician has requested that you have an echocardiogram. Echocardiography is a painless test that uses sound waves to create images of your heart. It provides your doctor with information about the size and shape of your heart and how well your heart's chambers and valves are working. This procedure takes approximately one hour. There are no restrictions for this procedure. Please do NOT wear cologne, perfume, aftershave, or lotions (deodorant is allowed). Please arrive 15 minutes prior to your appointment time.   Your physician has referred you to Nephrology.   Follow-Up: At [facility], you and your health needs are our priority.  As part of our continuing mission to provide you with exceptional heart care, we have created designated Provider Care Teams.  These Care Teams include your primary Cardiologist (physician) and Advanced Practice Providers (APPs -  Physician Assistants and Nurse Practitioners) who all work together to provide you with the care you need, when you need it.  We recommend signing up for the patient portal called "MyChart".  Sign up information is provided on this After Visit Summary.  MyChart is used to connect with patients for Virtual Visits  (Telemedicine).  Patients are able to view lab/test results, encounter notes, upcoming appointments, etc.  Non-urgent messages can be sent to your provider as well.   To learn more about what you can do with MyChart, go to [email or web address].    Your next appointment:   6 month(s)  Provider:   Melina Copa, PA-C

## 2022-09-15 NOTE — Progress Notes (Signed)
Cardiology Office Note:    Date:  09/15/2022   ID:  Carlos Hayes, DOB 1961/05/09, MRN [id number]  PCP:  Fenton Foy, NP   [place] Providers Cardiologist:  Werner Lean, MD     Referring MD: Fenton Foy, NP   CC: AI follow up  History of Present Illness:    Carlos Hayes is a 62 y.o. male with a hx of HTN with prior HTN emergency, Moderate to severe aortic regurgitation, HTN Prior Stroke, and HLD.   2022: Started on medical therapy and increased multiple medications.  LDL near goal, BP improved.  Did not get follow up imaging and was lost to f/u in 2023.  Patient notes that he is doing well.   Since last visit notes that he has been taking his medication but has not done much else. . There are no interval hospital/ED visit.    No chest pain or pressure .  No SOB/DOE and no PND/Orthopnea.  No weight gain or leg swelling.  No palpitations or syncope. Mostly sits at home and watches TV   Past Medical History:  Diagnosis Date   Aortic insufficiency    Chronic kidney disease, stage 3 (HCC)    Gout    Hyperlipidemia    Hypertension    TIA (transient ischemic attack)     No past surgical history on file.  Current Medications: Current Meds  Medication Sig   APPLE CIDER VINEGAR PO Take 1 capsule by mouth daily.   aspirin (ASPIRIN LOW DOSE) 81 MG EC tablet TAKE 1 TABLET (81 MG TOTAL) BY MOUTH DAILY. SWALLOW WHOLE.   irbesartan (AVAPRO) 300 MG tablet Take 1 tablet (300 mg total) by mouth daily.   [DISCONTINUED] amLODipine (NORVASC) 10 MG tablet Take 1 tablet (10 mg total) by mouth daily.   [DISCONTINUED] colchicine 0.6 MG tablet TAKE 1 TABLET (0.6 MG TOTAL) BY MOUTH DAILY AS NEEDED (GOUT).   [DISCONTINUED] ezetimibe (ZETIA) 10 MG tablet Take 1 tablet (10 mg total) by mouth daily.   [DISCONTINUED] hydrochlorothiazide (HYDRODIURIL) 25 MG tablet Take 1 tablet (25 mg total) by mouth daily.   [DISCONTINUED] irbesartan (AVAPRO) 150 MG tablet  Take 1 tablet (150 mg total) by mouth daily.   [DISCONTINUED] metoprolol succinate (TOPROL-XL) 25 MG 24 hr tablet Take 0.5 tablets (12.5 mg total) by mouth daily.   [DISCONTINUED] rosuvastatin (CRESTOR) 10 MG tablet Take 1 tablet (10 mg total) by mouth daily.     Allergies:   Lipitor [atorvastatin]   Social History   Socioeconomic History   Marital status: Single    Spouse name: Not on file   Number of children: Not on file   Years of education: Not on file   Highest education level: Not on file  Occupational History   Not on file  Tobacco Use   Smoking status: Never   Smokeless tobacco: Never  Vaping Use   Vaping Use: Never used  Substance and Sexual Activity   Alcohol use: Yes   Drug use: No   Sexual activity: Not on file  Other Topics Concern   Not on file  Social History Narrative   Not on file   Social Determinants of Health   Financial Resource Strain: Not on file  Food Insecurity: Not on file  Transportation Needs: Not on file  Physical Activity: Not on file  Stress: Not on file  Social Connections: Not on file     Family History: The patient's family history is negative for Heart  disease.  ROS:   Please see the history of present illness.     All other systems reviewed and are negative.  EKGs/Labs/Other Studies Reviewed:    The following studies were reviewed today:   EKG:  EKG is  ordered today.  The ekg ordered today demonstrates  09/15/22: SR rate 62 with septal infarct and 1st HB  Cardiac Studies & Procedures       ECHOCARDIOGRAM  ECHOCARDIOGRAM COMPLETE 09/27/2020  Narrative ECHOCARDIOGRAM REPORT    Patient Name:   Carlos Hayes Date of Exam: 09/27/2020 Medical Rec #:  [id number]         Height:       67.0 in Accession #:    [id number]        Weight:       170.0 lb Date of Birth:  [birth year]         BSA:          1.887 m Patient Age:    62 years          BP:           187/94 mmHg Patient Gender: M                 HR:           76  bpm. Exam Location:  [facility]  Procedure: 2D Echo, Cardiac Doppler and Color Doppler  Indications:    TIA 435.9  History:        Patient has no prior history of Echocardiogram examinations. Risk Factors:Hypertension. Gout (From Hx).  Sonographer:    Alvino Chapel RCS Referring Phys: [id number] [place]   1. Left ventricular ejection fraction, by estimation, is 55 to 60%. The left ventricle has normal function. Left ventricular endocardial border not optimally defined to evaluate regional wall motion. There is moderate left ventricular hypertrophy. Left ventricular diastolic parameters are consistent with Grade II diastolic dysfunction (pseudonormalization). 2. Right ventricular systolic function is normal. The right ventricular size is normal. Tricuspid regurgitation signal is inadequate for assessing PA pressure. 3. Left atrial size was moderately dilated. 4. Right atrial size was mild to moderately dilated. 5. The mitral valve is normal in structure. Trivial mitral valve regurgitation. No evidence of mitral stenosis. 6. The aortic valve is abnormal. Aortic valve regurgitation is moderate to moderate-severe. No aortic stenosis is present. Aortic regurgitation PHT measures 364 msec. Note BP at time of study is 187/94 mmHg. AV appears mildly degenerative, no definite leaflet perforation noted. Jet of AR is eccentric and directed toward anterior mitral leaflet. Quantitation challenging with eccentric jet. 7. The inferior vena cava is normal in size with greater than 50% respiratory variability, suggesting right atrial pressure of 3 mmHg. 8. Cannot exclude atrial level shunt.  FINDINGS Left Ventricle: Left ventricular ejection fraction, by estimation, is 55 to 60%. The left ventricle has normal function. Left ventricular endocardial border not optimally defined to evaluate regional wall motion. The left ventricular internal cavity size was normal in size. There is moderate  left ventricular hypertrophy. Left ventricular diastolic parameters are consistent with Grade II diastolic dysfunction (pseudonormalization).  Right Ventricle: The right ventricular size is normal. No increase in right ventricular wall thickness. Right ventricular systolic function is normal. Tricuspid regurgitation signal is inadequate for assessing PA pressure.  Left Atrium: Left atrial size was moderately dilated. Volume may be overestimated.  Right Atrium: Right atrial size was mild to moderately dilated.  Pericardium: There is no evidence of pericardial effusion.  Mitral Valve: The mitral valve is normal in structure. Trivial mitral valve regurgitation. No evidence of mitral valve stenosis.  Tricuspid Valve: The tricuspid valve is normal in structure. Tricuspid valve regurgitation is mild . No evidence of tricuspid stenosis.  Aortic Valve: The aortic valve is abnormal. Aortic valve regurgitation is moderate to severe. Aortic regurgitation PHT measures 364 msec. No aortic stenosis is present.  Pulmonic Valve: The pulmonic valve was normal in structure. Pulmonic valve regurgitation is mild. No evidence of pulmonic stenosis.  Aorta: The aortic root is normal in size and structure.  Venous: The inferior vena cava is normal in size with greater than 50% respiratory variability, suggesting right atrial pressure of 3 mmHg.  IAS/Shunts: Cannot exclude atrial level shunt.   LEFT VENTRICLE PLAX 2D LVIDd:         5.30 cm  Diastology LVIDs:         4.00 cm  LV e' medial:    4.78 cm/s LV PW:         1.60 cm  LV E/e' medial:  15.6 LV IVS:        1.50 cm  LV e' lateral:   5.43 cm/s LVOT diam:     2.40 cm  LV E/e' lateral: 13.8 LV SV:         86 LV SV Index:   46 LVOT Area:     4.52 cm   RIGHT VENTRICLE RV S prime:     15.30 cm/s TAPSE (M-mode): 2.7 cm  LEFT ATRIUM              Index       RIGHT ATRIUM           Index LA diam:        5.00 cm  2.65 cm/m  RA Area:     27.40 cm LA Vol  (A2C):   124.0 ml 65.70 ml/m RA Volume:   90.30 ml  47.85 ml/m LA Vol (A4C):   136.0 ml 72.06 ml/m LA Biplane Vol: 140.0 ml 74.18 ml/m AORTIC VALVE LVOT Vmax:   95.35 cm/s LVOT Vmean:  61.950 cm/s LVOT VTI:    0.190 m AI PHT:      364 msec  AORTA Ao Root diam: 3.40 cm  MITRAL VALVE MV Area (PHT): 5.38 cm    SHUNTS MV Decel Time: 141 msec    Systemic VTI:  0.19 m MV E velocity: 74.70 cm/s  Systemic Diam: 2.40 cm MV A velocity: 40.80 cm/s MV E/A ratio:  1.83  Cherlynn Kaiser MD Electronically signed by Cherlynn Kaiser MD Signature Date/Time: 09/27/2020/4:52:33 PM    Final             Recent Labs: 07/08/2022: ALT 33; BUN 25; Creatinine, Ser 1.73; Hemoglobin 14.1; Platelets 260; Potassium 3.7; Sodium 142  Recent Lipid Panel    Component Value Date/Time   CHOL 133 07/08/2022 1527   TRIG 92 07/08/2022 1527   HDL 41 07/08/2022 1527   CHOLHDL 3.2 07/08/2022 1527   CHOLHDL 4.2 09/27/2020 0111   VLDL 15 09/27/2020 0111   LDLCALC 74 07/08/2022 1527   LDLDIRECT 106 (H) 06/30/2021 1631     Risk Assessment/Calculations:      HYPERTENSION CONTROL Vitals:   09/15/22 1601 09/15/22 1621  BP: (!) 148/70 (!) 146/76    The patient's blood pressure is elevated above target today.  In order to address the patient's elevated BP: A current anti-hypertensive medication was adjusted today.  Physical Exam:    VS:  BP (!) 146/76   Pulse 62   Ht '5\' 7"'$  (1.702 m)   Wt 231 lb (104.8 kg)   SpO2 98%   BMI 36.18 kg/m     Wt Readings from Last 3 Encounters:  09/15/22 231 lb (104.8 kg)  07/08/22 227 lb 9.6 oz (103.2 kg)  06/30/21 224 lb 12.8 oz (102 kg)    GEN: Morbid Obesity no acute distress HEENT: Normal NECK: No JVD CARDIAC: RRR, no rubs, gallops; diastolic heart murmur RESPIRATORY:  Clear to auscultation without rales, wheezing or rhonchi  ABDOMEN: Soft, non-tender, non-distended MUSCULOSKELETAL:  No edema; No deformity  SKIN: Warm and dry NEUROLOGIC:   Alert and oriented x 3 PSYCHIATRIC:  Normal affect   ASSESSMENT:    1. Essential hypertension   2. Nonrheumatic aortic valve insufficiency   3. Morbid obesity ([place])   4. Stage 3 chronic kidney disease, unspecified whether stage 3a or 3b CKD ([place])   5. TIA (transient ischemic attack)   6. Mixed hyperlipidemia   7. Prediabetes   8. Hyperlipidemia, unspecified hyperlipidemia type   9. Hypertension, unspecified type    PLAN:    Moderate to severe aortic regurgitation - repeat echo  HTN Morbid Obesity CKD - on HCTZ 25 mg, norvasc 10  - will increase irebsartan to 300 mg PO daily and BMP in a week, If creatinine< 2 will keep - he is on metoprolol; will continue I do have strong indication for this - his brother used to work out with [employer]; he has a total gym; he is going to start exercising again - send to nephrology; may add SGLT2i at next visit - f/u BP at next visit; low threshold for Pharm D  Prior stroke HLD - LDL goal < 70; continue crestor and return zetia 10 mg - offer asa 81 mg PO daily  1st HB - monitor; low threshold to stop BB      [place] in six months unless severe AI Me in year   Medication Adjustments/Labs and Tests Ordered: Current medicines are reviewed at length with the patient today.  Concerns regarding medicines are outlined above.  Orders Placed This Encounter  Procedures   Basic metabolic panel   Ambulatory referral to Nephrology   EKG 12-Lead   ECHOCARDIOGRAM COMPLETE   Meds ordered this encounter  Medications   amLODipine (NORVASC) 10 MG tablet    Sig: Take 1 tablet (10 mg total) by mouth daily.    Dispense:  90 tablet    Refill:  3    Please call our office to schedule an overdue appointment with Dr. Gasper Sells before anymore refills. [phone]. Thank you 3rd and Final attempt   colchicine 0.6 MG tablet    Sig: Take 1 tablet (0.6 mg total) by mouth daily as needed (gout).    Dispense:  30 tablet    Refill:  2   ezetimibe  (ZETIA) 10 MG tablet    Sig: Take 1 tablet (10 mg total) by mouth daily.    Dispense:  90 tablet    Refill:  3   hydrochlorothiazide (HYDRODIURIL) 25 MG tablet    Sig: Take 1 tablet (25 mg total) by mouth daily.    Dispense:  90 tablet    Refill:  3    Please call our office to schedule an overdue appointment with Dr. Gasper Sells before anymore refills. [phone]. Thank you 3rd and Final attempt   metoprolol succinate (TOPROL-XL) 25 MG 24 hr  tablet    Sig: Take 0.5 tablets (12.5 mg total) by mouth daily.    Dispense:  45 tablet    Refill:  3    DX Code Needed  PT IS NEEDING MORE REFILLS.   rosuvastatin (CRESTOR) 10 MG tablet    Sig: Take 1 tablet (10 mg total) by mouth daily.    Dispense:  90 tablet    Refill:  3    Please call our office to schedule an overdue appointment with Dr. Gasper Sells before anymore refills. [phone]. Thank you 3rd and Final attempt   irbesartan (AVAPRO) 300 MG tablet    Sig: Take 1 tablet (300 mg total) by mouth daily.    Dispense:  90 tablet    Refill:  3    Patient Instructions  Medication Instructions:  Your physician has recommended you make the following change in your medication:  INCREASED: irbesartan (Avapro) to 300 mg by mouth once daily  START: Aspirin 81 mg by mouth once daily REFILLED: all cardiac medications  *If you need a refill on your cardiac medications before your next appointment, please call your pharmacy*   Lab Work: IN 1 WEEK: BMP  If you have labs (blood work) drawn today and your tests are completely normal, you will receive your results only by: [place] (if you have MyChart) OR A paper copy in the mail If you have any lab test that is abnormal or we need to change your treatment, we will call you to review the results.   Testing/Procedures: Your physician has requested that you have an echocardiogram. Echocardiography is a painless test that uses sound waves to create images of your heart. It  provides your doctor with information about the size and shape of your heart and how well your heart's chambers and valves are working. This procedure takes approximately one hour. There are no restrictions for this procedure. Please do NOT wear cologne, perfume, aftershave, or lotions (deodorant is allowed). Please arrive 15 minutes prior to your appointment time.   Your physician has referred you to Nephrology.   Follow-Up: At [facility], you and your health needs are our priority.  As part of our continuing mission to provide you with exceptional heart care, we have created designated Provider Care Teams.  These Care Teams include your primary Cardiologist (physician) and Advanced Practice Providers (APPs -  Physician Assistants and Nurse Practitioners) who all work together to provide you with the care you need, when you need it.  We recommend signing up for the patient portal called "MyChart".  Sign up information is provided on this After Visit Summary.  MyChart is used to connect with patients for Virtual Visits (Telemedicine).  Patients are able to view lab/test results, encounter notes, upcoming appointments, etc.  Non-urgent messages can be sent to your provider as well.   To learn more about what you can do with MyChart, go to [email or web address].    Your next appointment:   6 month(s)  Provider:   Melina Copa, PA-C           Signed, Werner Lean, MD  09/15/2022 4:57 PM    [place]

## 2022-09-22 ENCOUNTER — Ambulatory Visit: Payer: 59 | Attending: Internal Medicine

## 2022-09-22 DIAGNOSIS — I10 Essential (primary) hypertension: Secondary | ICD-10-CM

## 2022-09-25 LAB — BASIC METABOLIC PANEL
BUN/Creatinine Ratio: 16 (ref 9–20)
BUN: 25 mg/dL — ABNORMAL HIGH (ref 6–24)
CO2: 22 mmol/L (ref 20–29)
Calcium: 10.1 mg/dL (ref 8.7–10.2)
Chloride: 101 mmol/L (ref 96–106)
Creatinine, Ser: 1.55 mg/dL — ABNORMAL HIGH (ref 0.76–1.27)
Glucose: 101 mg/dL — ABNORMAL HIGH (ref 70–99)
Potassium: 4.3 mmol/L (ref 3.5–5.2)
Sodium: 143 mmol/L (ref 134–144)
eGFR: 51 mL/min/{1.73_m2} — ABNORMAL LOW (ref >59)

## 2022-10-12 ENCOUNTER — Other Ambulatory Visit (HOSPITAL_COMMUNITY): Payer: 59

## 2022-11-10 ENCOUNTER — Ambulatory Visit (HOSPITAL_COMMUNITY): Payer: 59 | Attending: Internal Medicine

## 2022-11-10 DIAGNOSIS — I351 Nonrheumatic aortic (valve) insufficiency: Secondary | ICD-10-CM | POA: Diagnosis not present

## 2022-11-10 DIAGNOSIS — I10 Essential (primary) hypertension: Secondary | ICD-10-CM | POA: Diagnosis not present

## 2022-11-11 ENCOUNTER — Encounter: Payer: Self-pay | Admitting: Internal Medicine

## 2022-11-11 LAB — ECHOCARDIOGRAM COMPLETE
Area-P 1/2: 3.14 cm2
P 1/2 time: 486 msec
S' Lateral: 3 cm

## 2022-11-15 ENCOUNTER — Encounter: Payer: Self-pay | Admitting: Internal Medicine

## 2022-11-15 ENCOUNTER — Ambulatory Visit: Payer: 59 | Attending: Internal Medicine | Admitting: Internal Medicine

## 2022-11-15 VITALS — BP 152/80 | HR 93 | Ht 67.0 in | Wt 232.0 lb

## 2022-11-15 DIAGNOSIS — I10 Essential (primary) hypertension: Secondary | ICD-10-CM | POA: Diagnosis not present

## 2022-11-15 DIAGNOSIS — I351 Nonrheumatic aortic (valve) insufficiency: Secondary | ICD-10-CM | POA: Diagnosis not present

## 2022-11-15 DIAGNOSIS — N183 Chronic kidney disease, stage 3 unspecified: Secondary | ICD-10-CM | POA: Diagnosis not present

## 2022-11-15 MED ORDER — CHLORTHALIDONE 25 MG PO TABS: 25.0000 mg | ORAL_TABLET | Freq: Every day | ORAL | 3 refills | Status: DC

## 2022-11-15 MED ORDER — SODIUM CHLORIDE 0.9% FLUSH
3.0000 mL | Freq: Two times a day (BID) | INTRAVENOUS | Status: DC
Start: 2022-11-15 — End: 2023-09-28

## 2022-11-15 NOTE — H&P (View-Only) (Signed)
Cardiology Office Note:    Date:  11/15/2022   ID:  Carlos Hayes, DOB 12/15/1960, MRN [id number]  PCP:  Nichols, Tonya S, NP   [facility] Providers Cardiologist:  Panagiotis Oelkers A Labib Cwynar, MD     Referring MD: Nichols, Tonya S, NP   CC: AI follow up  History of Present Illness:    Carlos Hayes is a 62 y.o. male with a hx of HTN with prior HTN emergency, Moderate to severe aortic regurgitation, HTN Prior Stroke, and HLD.   2022: Started on medical therapy and increased multiple medications.  LDL near goal, BP improved.  Did not get follow up imaging and was lost to f/u in 2023. 2024: Worsening AI  Patient notes that he is doing well.    No chest pain or pressure .  No SOB/DOE and no PND/Orthopnea.  No weight gain or leg swelling.  No palpitations or syncope.  He didn't end up using the [employer] but started using an exercise bike.  Over all feels well.    Past Medical History:  Diagnosis Date   Aortic insufficiency    Chronic kidney disease, stage 3    Gout    Hyperlipidemia    Hypertension    TIA (transient ischemic attack)     No past surgical history on file.  Current Medications: Current Meds  Medication Sig   amLODipine (NORVASC) 10 MG tablet Take 1 tablet (10 mg total) by mouth daily.   APPLE CIDER VINEGAR PO Take 1 capsule by mouth daily.   aspirin (ASPIRIN LOW DOSE) 81 MG EC tablet TAKE 1 TABLET (81 MG TOTAL) BY MOUTH DAILY. SWALLOW WHOLE.   chlorthalidone (HYGROTON) 25 MG tablet Take 1 tablet (25 mg total) by mouth daily.   colchicine 0.6 MG tablet Take 1 tablet (0.6 mg total) by mouth daily as needed (gout).   ezetimibe (ZETIA) 10 MG tablet Take 1 tablet (10 mg total) by mouth daily.   irbesartan (AVAPRO) 300 MG tablet Take 1 tablet (300 mg total) by mouth daily.   metoprolol succinate (TOPROL-XL) 25 MG 24 hr tablet Take 0.5 tablets (12.5 mg total) by mouth daily.   rosuvastatin (CRESTOR) 10 MG tablet Take 1 tablet (10 mg total) by mouth  daily.   [DISCONTINUED] hydrochlorothiazide (HYDRODIURIL) 25 MG tablet Take 1 tablet (25 mg total) by mouth daily.   Current Facility-Administered Medications for the 11/15/22 encounter (Office Visit) with Claribel Sachs A, MD  Medication   sodium chloride flush (NS) 0.9 % injection 3 mL     Allergies:   Lipitor [atorvastatin]   Social History   Socioeconomic History   Marital status: Single    Spouse name: Not on file   Number of children: Not on file   Years of education: Not on file   Highest education level: Not on file  Occupational History   Not on file  Tobacco Use   Smoking status: Never   Smokeless tobacco: Never  Vaping Use   Vaping Use: Never used  Substance and Sexual Activity   Alcohol use: Yes   Drug use: No   Sexual activity: Not on file  Other Topics Concern   Not on file  Social History Narrative   Not on file   Social Determinants of Health   Financial Resource Strain: Not on file  Food Insecurity: Not on file  Transportation Needs: Not on file  Physical Activity: Not on file  Stress: Not on file  Social Connections: Not on file      Social: saw the total eclipse together  Family History: The patient's family history is negative for Heart disease.  ROS:   Please see the history of present illness.     All other systems reviewed and are negative.  EKGs/Labs/Other Studies Reviewed:    The following studies were reviewed today:  EKG:  EKG is  ordered today.  The ekg ordered today demonstrates  09/15/22: SR rate 62 with septal infarct and 1st HB  Cardiac Studies & Procedures       ECHOCARDIOGRAM  ECHOCARDIOGRAM COMPLETE 11/11/2022  Narrative ECHOCARDIOGRAM REPORT    Patient Name:   Carlos Hayes Date of Exam: 11/10/2022 Medical Rec #:  [id number]         Height:       67.0 in Accession #:    [id number]        Weight:       231.0 lb Date of Birth:  09/27/1960         BSA:          2.150 m Patient Age:    62 years          BP:            146/76 mmHg Patient Gender: M                 HR:           65 bpm. Exam Location:  [address]  Procedure: 2D Echo, Color Doppler and Cardiac Doppler  Indications:    I35.1 Aortic Insufficiency  History:        Patient has prior history of Echocardiogram examinations, most recent 09/27/2020. TIA, Aortic Valve Disease; Risk Factors:Hypertension and Dyslipidemia.  Sonographer:    Bethany Mcmahill RDCS Referring Phys: Malachy Coleman A Ines Rebel  IMPRESSIONS   1. Severe, eccentric aortic valve regurgitation directed toward anterior mitral leaflet. Holodiastolic flow reversals in the descending aorta, reversal TVI 18 cm. PHT challenging to obtain due to eccentric jet. The aortic valve is abnormal. Unable to determine aortic valve morphology. Aortic valve regurgitation is severe. No aortic stenosis is present. 2. Left ventricular ejection fraction, by estimation, is 60 to 65%. The left ventricle has normal function. The left ventricle has no regional wall motion abnormalities. There is mild left ventricular hypertrophy. Left ventricular diastolic parameters are indeterminate. 3. Right ventricular systolic function is normal. The right ventricular size is normal. Tricuspid regurgitation signal is inadequate for assessing PA pressure. 4. The mitral valve is normal in structure. Trivial mitral valve regurgitation. No evidence of mitral stenosis. 5. The inferior vena cava is normal in size with greater than 50% respiratory variability, suggesting right atrial pressure of 3 mmHg. 6. Cannot exclude a small PFO.  FINDINGS Left Ventricle: Left ventricular ejection fraction, by estimation, is 60 to 65%. The left ventricle has normal function. The left ventricle has no regional wall motion abnormalities. The left ventricular internal cavity size was normal in size. There is mild left ventricular hypertrophy. Left ventricular diastolic parameters are indeterminate.  Right Ventricle: The right  ventricular size is normal. No increase in right ventricular wall thickness. Right ventricular systolic function is normal. Tricuspid regurgitation signal is inadequate for assessing PA pressure.  Left Atrium: Left atrial size was normal in size.  Right Atrium: Right atrial size was normal in size.  Pericardium: There is no evidence of pericardial effusion.  Mitral Valve: The mitral valve is normal in structure. Trivial mitral valve regurgitation. No evidence of mitral valve stenosis.  Tricuspid Valve: The   tricuspid valve is normal in structure. Tricuspid valve regurgitation is trivial. No evidence of tricuspid stenosis.  Aortic Valve: Severe, eccentric aortic valve regurgitation directed toward anterior mitral leaflet. Holodiastolic flow reversals, descending aorta diastolic reversal TVI 18 cm. The aortic valve is abnormal. Aortic valve regurgitation is severe. Aortic regurgitation PHT measures 486 msec. No aortic stenosis is present.  Pulmonic Valve: The pulmonic valve was normal in structure. Pulmonic valve regurgitation is mild. No evidence of pulmonic stenosis.  Aorta: The aortic root is normal in size and structure.  Venous: The inferior vena cava is normal in size with greater than 50% respiratory variability, suggesting right atrial pressure of 3 mmHg.  IAS/Shunts: There is redundancy of the interatrial septum. Cannot exclude a small PFO.   LEFT VENTRICLE PLAX 2D LVIDd:         5.10 cm   Diastology LVIDs:         3.00 cm   LV e' medial:    5.88 cm/s LV PW:         1.20 cm   LV E/e' medial:  11.9 LV IVS:        1.10 cm   LV e' lateral:   7.94 cm/s LVOT diam:     2.60 cm   LV E/e' lateral: 8.8 LV SV:         96 LV SV Index:   45 LVOT Area:     5.31 cm   RIGHT VENTRICLE RV Basal diam:  3.80 cm RV S prime:     15.30 cm/s TAPSE (M-mode): 1.7 cm  LEFT ATRIUM             Index        RIGHT ATRIUM           Index LA diam:        4.40 cm 2.05 cm/m   RA Pressure: 3.00  mmHg LA Vol (A2C):   49.7 ml 23.12 ml/m  RA Area:     13.90 cm LA Vol (A4C):   30.1 ml 14.00 ml/m  RA Volume:   32.90 ml  15.30 ml/m LA Biplane Vol: 41.3 ml 19.21 ml/m AORTIC VALVE LVOT Vmax:   86.00 cm/s LVOT Vmean:  54.500 cm/s LVOT VTI:    0.181 m AI PHT:      486 msec  AORTA Ao Root diam: 3.60 cm Ao Asc diam:  3.60 cm  MITRAL VALVE               TRICUSPID VALVE MV Area (PHT): cm         Estimated RAP:  3.00 mmHg MV Decel Time: 242 msec MV E velocity: 70.20 cm/s  SHUNTS MV A velocity: 53.45 cm/s  Systemic VTI:  0.18 m MV E/A ratio:  1.31        Systemic Diam: 2.60 cm  Gayatri Acharya MD Electronically signed by Gayatri Acharya MD Signature Date/Time: 11/11/2022/11:38:16 AM    Final             Recent Labs: 07/08/2022: ALT 33; Hemoglobin 14.1; Platelets 260 09/22/2022: BUN 25; Creatinine, Ser 1.55; Potassium 4.3; Sodium 143  Recent Lipid Panel    Component Value Date/Time   CHOL 133 07/08/2022 1527   TRIG 92 07/08/2022 1527   HDL 41 07/08/2022 1527   CHOLHDL 3.2 07/08/2022 1527   CHOLHDL 4.2 09/27/2020 0111   VLDL 15 09/27/2020 0111   LDLCALC 74 07/08/2022 1527   LDLDIRECT 106 (H) 06/30/2021 1631         Physical Exam:    VS:  BP (!) 152/80   Pulse 93   Ht 5' 7" (1.702 m)   Wt 232 lb (105.2 kg)   SpO2 95%   BMI 36.34 kg/m     Wt Readings from Last 3 Encounters:  11/15/22 232 lb (105.2 kg)  09/15/22 231 lb (104.8 kg)  07/08/22 227 lb 9.6 oz (103.2 kg)    GEN: Morbid Obesity no acute distress HEENT: Normal NECK: No JVD CARDIAC: RRR, no rubs, gallops; diastolic heart murmur RESPIRATORY:  Clear to auscultation without rales, wheezing or rhonchi  ABDOMEN: Soft, non-tender, abdominal hernia noted MUSCULOSKELETAL:  No edema; No deformity  SKIN: Warm and dry NEUROLOGIC:  Alert and oriented x 3 PSYCHIATRIC:  Normal affect   ASSESSMENT:    1. Nonrheumatic aortic valve insufficiency   2. Essential hypertension   3. Stage 3 chronic kidney disease,  unspecified whether stage 3a or 3b CKD     PLAN:    Severe aortic regurgitation - severe AI without LV dysfunction or dilation - discussed the natural history of AI, will get LHC and TEE and likely get surgical evaluation after  HTN Morbid Obesity CKD - on HCTZ 25 mg--> chlorthalidone 25 and f/u labs - norvasc 10  - Irebsartan  300 mg PO daily - if AKI may need hydralazine, limited great options due to his comorbidiy  Prior stroke HLD - LDL goal < 70; continue crestor and return zetia 10 mg - asa 81 mg PO daily   Time Spent Directly with Patient:   I have spent a total of 40 minutes with the patient reviewing notes, imaging, EKGs, labs and examining the patient as well as establishing an assessment and plan that was discussed personally with the patient.  > 50% of time was spent in direct patient care and reviewing imaging with patient.        Medication Adjustments/Labs and Tests Ordered: Current medicines are reviewed at length with the patient today.  Concerns regarding medicines are outlined above.  Orders Placed This Encounter  Procedures   CBC   Basic metabolic panel   EKG 12-Lead   Meds ordered this encounter  Medications   chlorthalidone (HYGROTON) 25 MG tablet    Sig: Take 1 tablet (25 mg total) by mouth daily.    Dispense:  90 tablet    Refill:  3   sodium chloride flush (NS) 0.9 % injection 3 mL    Patient Instructions  Medication Instructions:  Your physician has recommended you make the following change in your medication:  STOP: hydrochlorothiazide START: chlorthalidone 25 mg by mouth once daily *If you need a refill on your cardiac medications before your next appointment, please call your pharmacy*   Lab Work: ON APRIL 17: CBC, BMP  If you have labs (blood work) drawn today and your tests are completely normal, you will receive your results only by: MyChart Message (if you have MyChart) OR A paper copy in the mail If you have any lab test  that is abnormal or we need to change your treatment, we will call you to review the results.   Testing/Procedures: Your physician has requested that you have a TEE. During a TEE, sound waves are used to create images of your heart. It provides your doctor with information about the size and shape of your heart and how well your heart's chambers and valves are working. In this test, a transducer is attached to the end of a flexible tube that's   guided down your throat and into your esophagus (the tube leading from you mouth to your stomach) to get a more detailed image of your heart. You are not awake for the procedure. Please see the instruction sheet given to you today. For further information please visit [email or web address].    Your physician has requested that you have a cardiac catheterization. Cardiac catheterization is used to diagnose and/or treat various heart conditions. Doctors may recommend this procedure for a number of different reasons. The most common reason is to evaluate chest pain. Chest pain can be a symptom of coronary artery disease (CAD), and cardiac catheterization can show whether plaque is narrowing or blocking your heart's arteries. This procedure is also used to evaluate the valves, as well as measure the blood flow and oxygen levels in different parts of your heart. For further information please visit www.[email or web address]. Please follow instruction sheet, as given.    Follow-Up: At [facility], you and your health needs are our priority.  As part of our continuing mission to provide you with exceptional heart care, we have created designated Provider Care Teams.  These Care Teams include your primary Cardiologist (physician) and Advanced Practice Providers (APPs -  Physician Assistants and Nurse Practitioners) who all work together to provide you with the care you need, when you need it.  We recommend signing up for the patient portal called "MyChart".  Sign up  information is provided on this After Visit Summary.  MyChart is used to connect with patients for Virtual Visits (Telemedicine).  Patients are able to view lab/test results, encounter notes, upcoming appointments, etc.  Non-urgent messages can be sent to your provider as well.   To learn more about what you can do with MyChart, go to [email or web address].    Your next appointment:   3 month(s)  Provider:   Tessa Conte, PA-C, Ernest Dick, NP, Michele Lenze, PA-C, Michelle Swinyer, NP, or Scott Weaver, PA-C        Other Instructions   Dear Cortavius Thoen  You are scheduled for a TEE (Transesophageal Echocardiogram) on Tuesday, April 23 with Dr. Skains.  Please arrive at the [facility] (Main Entrance A) at [facility]: [address] [place], [place] [address] at 6:30 AM.    DIET:  Nothing to eat or drink after midnight except a sip of water with medications (see medication instructions below)  MEDICATION INSTRUCTIONS: !!IF ANY NEW MEDICATIONS ARE STARTED AFTER TODAY, PLEASE NOTIFY YOUR PROVIDER AS SOON AS POSSIBLE!!  FYI: Medications such as Semaglutide (Ozempic, [place]), Tirzepatide (Mounjaro, Zepbound), Dulaglutide (Trulicity), etc ("GLP1 agonists") must be held around the time of a procedure. Talk to your provider if you take one of these.  Do not take chlorthalidone the day of testing.    LABS:  Come to the lab at [facility] at [address] between the hours of 8:00 am and 4:30 pm. You do NOT have to be fasting.  FYI:  For your safety, and to allow us to monitor your vital signs accurately during the surgery/procedure we request: If you have artificial nails, gel coating, SNS etc, please have those removed prior to your surgery/procedure. Not having the nail coverings /polish removed may result in cancellation or delay of your surgery/procedure.  You must have a responsible person to drive you home and stay in the waiting area during your  procedure. Failure to do so could result in cancellation.  Bring your insurance cards.  *Special Note: Every effort is   made to have your procedure done on time. Occasionally there are emergencies that occur at the hospital that may cause delays. Please be patient if a delay does occur.         Draylon Fiser  11/15/2022  You are scheduled for a Cardiac Catheterization on Tuesday, April 23 with Dr. Michael Cooper.  1. Please arrive at the [facility] Tower (Main Entrance A) at [facility]: [address] [place], [place] [address] at 6:30 AM (This time is two hours before your procedure to ensure your preparation). Free valet parking service is available.   Special note: Every effort is made to have your procedure done on time. Please understand that emergencies sometimes delay scheduled procedures.  2. Diet: Do not eat solid foods after midnight.  The patient may have clear liquids until 5am upon the day of the procedure.  3. Labs: You will need to have blood drawn on Wednesday, April 17 at [facility] at [address], [place]  Open: 7:30am - 5pm    Phone: [phone]. You do not need to be fasting.  4. Medication instructions in preparation for your procedure:   Contrast Allergy: No   Do not take chlorthalidone on the morning of testing.  Do not take irbersartan the day before and day of testing.     On the morning of your procedure, take your Aspirin 81 mg and any morning medicines NOT listed above.  You may use sips of water.  5. Plan to go home the same day, you will only stay overnight if medically necessary. 6. Bring a current list of your medications and current insurance cards. 7. You MUST have a responsible person to drive you home. 8. Someone MUST be with you the first 24 hours after you arrive home or your discharge will be delayed. 9. Please wear clothes that are easy to get on and off and wear slip-on shoes.  Thank you  for allowing us to care for you!   -- [facility]     Signed, Narcissus Detwiler A Keiasha Diep, MD  11/15/2022 4:21 PM     [facility] 

## 2022-11-15 NOTE — Patient Instructions (Signed)
Medication Instructions:  Your physician has recommended you make the following change in your medication:  STOP: hydrochlorothiazide START: chlorthalidone 25 mg by mouth once daily *If you need a refill on your cardiac medications before your next appointment, please call your pharmacy*   Lab Work: ON APRIL 17: CBC, BMP  If you have labs (blood work) drawn today and your tests are completely normal, you will receive your results only by: MyChart Message (if you have MyChart) OR A paper copy in the mail If you have any lab test that is abnormal or we need to change your treatment, we will call you to review the results.   Testing/Procedures: Your physician has requested that you have a TEE. During a TEE, sound waves are used to create images of your heart. It provides your doctor with information about the size and shape of your heart and how well your heart's chambers and valves are working. In this test, a transducer is attached to the end of a flexible tube that's guided down your throat and into your esophagus (the tube leading from you mouth to your stomach) to get a more detailed image of your heart. You are not awake for the procedure. Please see the instruction sheet given to you today. For further information please visit [email or web address]    Your physician has requested that you have a cardiac catheterization. Cardiac catheterization is used to diagnose and/or treat various heart conditions. Doctors may recommend this procedure for a number of different reasons. The most common reason is to evaluate chest pain. Chest pain can be a symptom of coronary artery disease (CAD), and cardiac catheterization can show whether plaque is narrowing or blocking your heart's arteries. This procedure is also used to evaluate the valves, as well as measure the blood flow and oxygen levels in different parts of your heart. For further information please visit [email or web address] Please follow  instruction sheet, as given.    Follow-Up: At [facility], you and your health needs are our priority.  As part of our continuing mission to provide you with exceptional heart care, we have created designated Provider Care Teams.  These Care Teams include your primary Cardiologist (physician) and Advanced Practice Providers (APPs -  Physician Assistants and Nurse Practitioners) who all work together to provide you with the care you need, when you need it.  We recommend signing up for the patient portal called "MyChart".  Sign up information is provided on this After Visit Summary.  MyChart is used to connect with patients for Virtual Visits (Telemedicine).  Patients are able to view lab/test results, encounter notes, upcoming appointments, etc.  Non-urgent messages can be sent to your provider as well.   To learn more about what you can do with MyChart, go to [email or web address].    Your next appointment:   3 month(s)  Provider:   Jari Favre, PA-C, Robin Searing, NP, Jacolyn Reedy, PA-C, Eligha Bridegroom, NP, or Tereso Newcomer, [place]        Other Instructions   Dear Carlos Hayes  You are scheduled for a TEE (Transesophageal Echocardiogram) on Tuesday, April 23 with Dr. Anne Fu.  Please arrive at the [facility] (Main Entrance A) at [facility]: [address] [place], [place] [address] at 6:30 AM.    DIET:  Nothing to eat or drink after midnight except a sip of water with medications (see medication instructions below)  MEDICATION INSTRUCTIONS: !!IF ANY NEW MEDICATIONS ARE STARTED AFTER TODAY, PLEASE NOTIFY YOUR PROVIDER AS  SOON AS POSSIBLE!!  FYI: Medications such as Semaglutide (Ozempic, [place]), Tirzepatide (Mounjaro, Zepbound), Dulaglutide (Trulicity), etc ("GLP1 agonists") must be held around the time of a procedure. Talk to your provider if you take one of these.  Do not take chlorthalidone the day of testing.    LABS:  Come to the lab at [facility] at [address] between the hours of 8:00 am and 4:30 pm. You do NOT have to be fasting.  FYI:  For your safety, and to allow [place] to monitor your vital signs accurately during the surgery/procedure we request: If you have artificial nails, gel coating, SNS etc, please have those removed prior to your surgery/procedure. Not having the nail coverings /polish removed may result in cancellation or delay of your surgery/procedure.  You must have a responsible person to drive you home and stay in the waiting area during your procedure. Failure to do so could result in cancellation.  Bring your insurance cards.  *Special Note: Every effort is made to have your procedure done on time. Occasionally there are emergencies that occur at the hospital that may cause delays. Please be patient if a delay does occur.         Carlos Hayes  11/15/2022  You are scheduled for a Cardiac Catheterization on Tuesday, April 23 with Dr. Tonny Bollman.  1. Please arrive at the [facility] (Main Entrance A) at [facility]: [address] [place], [place] [address] at 6:30 AM (This time is two hours before your procedure to ensure your preparation). Free valet parking service is available.   Special note: Every effort is made to have your procedure done on time. Please understand that emergencies sometimes delay scheduled procedures.  2. Diet: Do not eat solid foods after midnight.  The patient may have clear liquids until 5am upon the day of the procedure.  3. Labs: You will need to have blood drawn on Wednesday, April 17 at [facility]. [address], [place]  Open: 7:30am - 5pm    Phone: [phone]. You do not need to be fasting.  4. Medication instructions in preparation for your procedure:   Contrast Allergy: No   Do not take chlorthalidone on the morning of testing.  Do not take irbersartan the day before and day of testing.      On the morning of your procedure, take your Aspirin 81 mg and any morning medicines NOT listed above.  You may use sips of water.  5. Plan to go home the same day, you will only stay overnight if medically necessary. 6. Bring a current list of your medications and current insurance cards. 7. You MUST have a responsible person to drive you home. 8. Someone MUST be with you the first 24 hours after you arrive home or your discharge will be delayed. 9. Please wear clothes that are easy to get on and off and wear slip-on shoes.  Thank you for allowing [place] to care for you!   --  Invasive Cardiovascular services

## 2022-11-15 NOTE — Progress Notes (Signed)
Cardiology Office Note:    Date:  11/15/2022   ID:  Carlos Hayes, DOB [birth year], MRN [id number]  PCP:  Ivonne Andrew, NP   [facility] Providers Cardiologist:  Christell Constant, MD     Referring MD: Ivonne Andrew, NP   CC: AI follow up  History of Present Illness:    Carlos Hayes is a 62 y.o. male with a hx of HTN with prior HTN emergency, Moderate to severe aortic regurgitation, HTN Prior Stroke, and HLD.   2022: Started on medical therapy and increased multiple medications.  LDL near goal, BP improved.  Did not get follow up imaging and was lost to f/u in 2023. 2024: Worsening AI  Patient notes that he is doing well.    No chest pain or pressure .  No SOB/DOE and no PND/Orthopnea.  No weight gain or leg swelling.  No palpitations or syncope.  He didn't end up using the [employer] but started using an exercise bike.  Over all feels well.    Past Medical History:  Diagnosis Date   Aortic insufficiency    Chronic kidney disease, stage 3    Gout    Hyperlipidemia    Hypertension    TIA (transient ischemic attack)     No past surgical history on file.  Current Medications: Current Meds  Medication Sig   amLODipine (NORVASC) 10 MG tablet Take 1 tablet (10 mg total) by mouth daily.   APPLE CIDER VINEGAR PO Take 1 capsule by mouth daily.   aspirin (ASPIRIN LOW DOSE) 81 MG EC tablet TAKE 1 TABLET (81 MG TOTAL) BY MOUTH DAILY. SWALLOW WHOLE.   chlorthalidone (HYGROTON) 25 MG tablet Take 1 tablet (25 mg total) by mouth daily.   colchicine 0.6 MG tablet Take 1 tablet (0.6 mg total) by mouth daily as needed (gout).   ezetimibe (ZETIA) 10 MG tablet Take 1 tablet (10 mg total) by mouth daily.   irbesartan (AVAPRO) 300 MG tablet Take 1 tablet (300 mg total) by mouth daily.   metoprolol succinate (TOPROL-XL) 25 MG 24 hr tablet Take 0.5 tablets (12.5 mg total) by mouth daily.   rosuvastatin (CRESTOR) 10 MG tablet Take 1 tablet (10 mg total) by mouth  daily.   [DISCONTINUED] hydrochlorothiazide (HYDRODIURIL) 25 MG tablet Take 1 tablet (25 mg total) by mouth daily.   Current Facility-Administered Medications for the 11/15/22 encounter (Office Visit) with Christell Constant, MD  Medication   sodium chloride flush (NS) 0.9 % injection 3 mL     Allergies:   Lipitor [atorvastatin]   Social History   Socioeconomic History   Marital status: Single    Spouse name: Not on file   Number of children: Not on file   Years of education: Not on file   Highest education level: Not on file  Occupational History   Not on file  Tobacco Use   Smoking status: Never   Smokeless tobacco: Never  Vaping Use   Vaping Use: Never used  Substance and Sexual Activity   Alcohol use: Yes   Drug use: No   Sexual activity: Not on file  Other Topics Concern   Not on file  Social History Narrative   Not on file   Social Determinants of Health   Financial Resource Strain: Not on file  Food Insecurity: Not on file  Transportation Needs: Not on file  Physical Activity: Not on file  Stress: Not on file  Social Connections: Not on file  Social: saw the total eclipse together  Family History: The patient's family history is negative for Heart disease.  ROS:   Please see the history of present illness.     All other systems reviewed and are negative.  EKGs/Labs/Other Studies Reviewed:    The following studies were reviewed today:  EKG:  EKG is  ordered today.  The ekg ordered today demonstrates  09/15/22: SR rate 62 with septal infarct and 1st HB  Cardiac Studies & Procedures       ECHOCARDIOGRAM  ECHOCARDIOGRAM COMPLETE 11/11/2022  Narrative ECHOCARDIOGRAM REPORT    Patient Name:   Carlos Hayes Date of Exam: 11/10/2022 Medical Rec #:  [id number]         Height:       67.0 in Accession #:    [id number]        Weight:       231.0 lb Date of Birth:  May 30, 1961         BSA:          2.150 m Patient Age:    61 years          BP:            146/76 mmHg Patient Gender: M                 HR:           65 bpm. Exam Location:  [address]  Procedure: 2D Echo, Color Doppler and Cardiac Doppler  Indications:    I35.1 Aortic Insufficiency  History:        Patient has prior history of Echocardiogram examinations, most recent 09/27/2020. TIA, Aortic Valve Disease; Risk Factors:Hypertension and Dyslipidemia.  Sonographer:    Farrel Conners RDCS Referring Phys: [facility] Mattison Stuckey  IMPRESSIONS   1. Severe, eccentric aortic valve regurgitation directed toward anterior mitral leaflet. Holodiastolic flow reversals in the descending aorta, reversal TVI 18 cm. PHT challenging to obtain due to eccentric jet. The aortic valve is abnormal. Unable to determine aortic valve morphology. Aortic valve regurgitation is severe. No aortic stenosis is present. 2. Left ventricular ejection fraction, by estimation, is 60 to 65%. The left ventricle has normal function. The left ventricle has no regional wall motion abnormalities. There is mild left ventricular hypertrophy. Left ventricular diastolic parameters are indeterminate. 3. Right ventricular systolic function is normal. The right ventricular size is normal. Tricuspid regurgitation signal is inadequate for assessing PA pressure. 4. The mitral valve is normal in structure. Trivial mitral valve regurgitation. No evidence of mitral stenosis. 5. The inferior vena cava is normal in size with greater than 50% respiratory variability, suggesting right atrial pressure of 3 mmHg. 6. Cannot exclude a small PFO.  FINDINGS Left Ventricle: Left ventricular ejection fraction, by estimation, is 60 to 65%. The left ventricle has normal function. The left ventricle has no regional wall motion abnormalities. The left ventricular internal cavity size was normal in size. There is mild left ventricular hypertrophy. Left ventricular diastolic parameters are indeterminate.  Right Ventricle: The right  ventricular size is normal. No increase in right ventricular wall thickness. Right ventricular systolic function is normal. Tricuspid regurgitation signal is inadequate for assessing PA pressure.  Left Atrium: Left atrial size was normal in size.  Right Atrium: Right atrial size was normal in size.  Pericardium: There is no evidence of pericardial effusion.  Mitral Valve: The mitral valve is normal in structure. Trivial mitral valve regurgitation. No evidence of mitral valve stenosis.  Tricuspid Valve: The  tricuspid valve is normal in structure. Tricuspid valve regurgitation is trivial. No evidence of tricuspid stenosis.  Aortic Valve: Severe, eccentric aortic valve regurgitation directed toward anterior mitral leaflet. Holodiastolic flow reversals, descending aorta diastolic reversal TVI 18 cm. The aortic valve is abnormal. Aortic valve regurgitation is severe. Aortic regurgitation PHT measures 486 msec. No aortic stenosis is present.  Pulmonic Valve: The pulmonic valve was normal in structure. Pulmonic valve regurgitation is mild. No evidence of pulmonic stenosis.  Aorta: The aortic root is normal in size and structure.  Venous: The inferior vena cava is normal in size with greater than 50% respiratory variability, suggesting right atrial pressure of 3 mmHg.  IAS/Shunts: There is redundancy of the interatrial septum. Cannot exclude a small PFO.   LEFT VENTRICLE PLAX 2D LVIDd:         5.10 cm   Diastology LVIDs:         3.00 cm   LV e' medial:    5.88 cm/s LV PW:         1.20 cm   LV E/e' medial:  11.9 LV IVS:        1.10 cm   LV e' lateral:   7.94 cm/s LVOT diam:     2.60 cm   LV E/e' lateral: 8.8 LV SV:         96 LV SV Index:   45 LVOT Area:     5.31 cm   RIGHT VENTRICLE RV Basal diam:  3.80 cm RV S prime:     15.30 cm/s TAPSE (M-mode): 1.7 cm  LEFT ATRIUM             Index        RIGHT ATRIUM           Index LA diam:        4.40 cm 2.05 cm/m   RA Pressure: 3.00  mmHg LA Vol (A2C):   49.7 ml 23.12 ml/m  RA Area:     13.90 cm LA Vol (A4C):   30.1 ml 14.00 ml/m  RA Volume:   32.90 ml  15.30 ml/m LA Biplane Vol: 41.3 ml 19.21 ml/m AORTIC VALVE LVOT Vmax:   86.00 cm/s LVOT Vmean:  54.500 cm/s LVOT VTI:    0.181 m AI PHT:      486 msec  AORTA Ao Root diam: 3.60 cm Ao Asc diam:  3.60 cm  MITRAL VALVE               TRICUSPID VALVE MV Area (PHT): cm         Estimated RAP:  3.00 mmHg MV Decel Time: 242 msec MV E velocity: 70.20 cm/s  SHUNTS MV A velocity: 53.45 cm/s  Systemic VTI:  0.18 m MV E/A ratio:  1.31        Systemic Diam: 2.60 cm  Weston Brass MD Electronically signed by Weston Brass MD Signature Date/Time: 11/11/2022/11:38:16 AM    Final             Recent Labs: 07/08/2022: ALT 33; Hemoglobin 14.1; Platelets 260 09/22/2022: BUN 25; Creatinine, Ser 1.55; Potassium 4.3; Sodium 143  Recent Lipid Panel    Component Value Date/Time   CHOL 133 07/08/2022 1527   TRIG 92 07/08/2022 1527   HDL 41 07/08/2022 1527   CHOLHDL 3.2 07/08/2022 1527   CHOLHDL 4.2 09/27/2020 0111   VLDL 15 09/27/2020 0111   LDLCALC 74 07/08/2022 1527   LDLDIRECT 106 (H) 06/30/2021 1631  Physical Exam:    VS:  BP (!) 152/80   Pulse 93   Ht 5\' 7"  (1.702 m)   Wt 232 lb (105.2 kg)   SpO2 95%   BMI 36.34 kg/m     Wt Readings from Last 3 Encounters:  11/15/22 232 lb (105.2 kg)  09/15/22 231 lb (104.8 kg)  07/08/22 227 lb 9.6 oz (103.2 kg)    GEN: Morbid Obesity no acute distress HEENT: Normal NECK: No JVD CARDIAC: RRR, no rubs, gallops; diastolic heart murmur RESPIRATORY:  Clear to auscultation without rales, wheezing or rhonchi  ABDOMEN: Soft, non-tender, abdominal hernia noted MUSCULOSKELETAL:  No edema; No deformity  SKIN: Warm and dry NEUROLOGIC:  Alert and oriented x 3 PSYCHIATRIC:  Normal affect   ASSESSMENT:    1. Nonrheumatic aortic valve insufficiency   2. Essential hypertension   3. Stage 3 chronic kidney disease,  unspecified whether stage 3a or 3b CKD     PLAN:    Severe aortic regurgitation - severe AI without LV dysfunction or dilation - discussed the natural history of AI, will get LHC and TEE and likely get surgical evaluation after  HTN Morbid Obesity CKD - on HCTZ 25 mg--> chlorthalidone 25 and f/u labs - norvasc 10  - Irebsartan  300 mg PO daily - if AKI may need hydralazine, limited great options due to his comorbidiy  Prior stroke HLD - LDL goal < 70; continue crestor and return zetia 10 mg - asa 81 mg PO daily   Time Spent Directly with Patient:   I have spent a total of 40 minutes with the patient reviewing notes, imaging, EKGs, labs and examining the patient as well as establishing an assessment and plan that was discussed personally with the patient.  > 50% of time was spent in direct patient care and reviewing imaging with patient.        Medication Adjustments/Labs and Tests Ordered: Current medicines are reviewed at length with the patient today.  Concerns regarding medicines are outlined above.  Orders Placed This Encounter  Procedures   CBC   Basic metabolic panel   EKG 12-Lead   Meds ordered this encounter  Medications   chlorthalidone (HYGROTON) 25 MG tablet    Sig: Take 1 tablet (25 mg total) by mouth daily.    Dispense:  90 tablet    Refill:  3   sodium chloride flush (NS) 0.9 % injection 3 mL    Patient Instructions  Medication Instructions:  Your physician has recommended you make the following change in your medication:  STOP: hydrochlorothiazide START: chlorthalidone 25 mg by mouth once daily *If you need a refill on your cardiac medications before your next appointment, please call your pharmacy*   Lab Work: ON APRIL 17: CBC, BMP  If you have labs (blood work) drawn today and your tests are completely normal, you will receive your results only by: MyChart Message (if you have MyChart) OR A paper copy in the mail If you have any lab test  that is abnormal or we need to change your treatment, we will call you to review the results.   Testing/Procedures: Your physician has requested that you have a TEE. During a TEE, sound waves are used to create images of your heart. It provides your doctor with information about the size and shape of your heart and how well your heart's chambers and valves are working. In this test, a transducer is attached to the end of a flexible tube that's  guided down your throat and into your esophagus (the tube leading from you mouth to your stomach) to get a more detailed image of your heart. You are not awake for the procedure. Please see the instruction sheet given to you today. For further information please visit [email or web address].    Your physician has requested that you have a cardiac catheterization. Cardiac catheterization is used to diagnose and/or treat various heart conditions. Doctors may recommend this procedure for a number of different reasons. The most common reason is to evaluate chest pain. Chest pain can be a symptom of coronary artery disease (CAD), and cardiac catheterization can show whether plaque is narrowing or blocking your heart's arteries. This procedure is also used to evaluate the valves, as well as measure the blood flow and oxygen levels in different parts of your heart. For further information please visit [email or web address]. Please follow instruction sheet, as given.    Follow-Up: At [facility], you and your health needs are our priority.  As part of our continuing mission to provide you with exceptional heart care, we have created designated Provider Care Teams.  These Care Teams include your primary Cardiologist (physician) and Advanced Practice Providers (APPs -  Physician Assistants and Nurse Practitioners) who all work together to provide you with the care you need, when you need it.  We recommend signing up for the patient portal called "MyChart".  Sign up  information is provided on this After Visit Summary.  MyChart is used to connect with patients for Virtual Visits (Telemedicine).  Patients are able to view lab/test results, encounter notes, upcoming appointments, etc.  Non-urgent messages can be sent to your provider as well.   To learn more about what you can do with MyChart, go to [email or web address].    Your next appointment:   3 month(s)  Provider:   Jari Favreessa Conte, PA-C, Robin SearingErnest Dick, NP, Jacolyn ReedyMichele Lenze, PA-C, Eligha BridegroomMichelle Swinyer, NP, or Tereso NewcomerScott Weaver, [place]PA-C        Other Instructions   Dear Pleas PatriciaAlexander Harlan  You are scheduled for a TEE (Transesophageal Echocardiogram) on Tuesday, April 23 with Dr. Anne FuSkains.  Please [facility] the [facility] (Main [facility]trance A) at [facility]: [address] [place], [place] [address] at 6:30 AM.    DIET:  Nothing to eat or drink after midnight except a sip of water with medications (see medication instructions below)  MEDICATION INSTRUCTIONS: !!IF ANY NEW MEDICATIONS ARE STARTED AFTER TODAY, PLEASE NOTIFY YOUR PROVIDER AS SOON AS POSSIBLE!!  FYI: Medications such as Semaglutide (Ozempic, BahamasWegovy), Tirzepatide (Mounjaro, Zepbound), Dulaglutide (Trulicity), etc ("GLP1 agonists") must be held around the time of a procedure. Talk to your provider if you take one of these.  Do not take chlorthalidone the day of testing.    LABS:  Come to the lab at [facility] at [address] between the hours of 8:00 am and 4:30 pm. You do NOT have to be fasting.  FYI:  For your safety, and to allow us to monitor your vital signs accurately during the surgery/procedure we request: If you have artificial nails, gel coating, SNS etc, please have those removed prior to your surgery/procedure. Not having the nail coverings /polish removed may result in cancellation or delay of your surgery/procedure.  You must have a responsible person to drive you home and stay in the waiting area during your  procedure. Failure to do so could result in cancellation.  Bring your insurance cards.  *Special Note: Every effort is  made to have your procedure done on time. Occasionally there are emergencies that occur at the hospital that may cause delays. Please be patient if a delay does occur.         Sriyansh Nordmeyer  11/15/2022  You are scheduled for a Cardiac Catheterization on Tuesday, April 23 with Dr. Tonny Bollman.  1. Please arrive at the [facility] (Main Entrance A) at [facility]: [address] [place], [place] [address] at 6:30 AM (This time is two hours before your procedure to ensure your preparation). Free valet parking service is available.   Special note: Every effort is made to have your procedure done on time. Please understand that emergencies sometimes delay scheduled procedures.  2. Diet: Do not eat solid foods after midnight.  The patient may have clear liquids until 5am upon the day of the procedure.  3. Labs: You will need to have blood drawn on Wednesday, April 17 at [facility]. [address], [place]  Open: 7:30am - 5pm    Phone: [phone]. You do not need to be fasting.  4. Medication instructions in preparation for your procedure:   Contrast Allergy: No   Do not take chlorthalidone on the morning of testing.  Do not take irbersartan the day before and day of testing.     On the morning of your procedure, take your Aspirin 81 mg and any morning medicines NOT listed above.  You may use sips of water.  5. Plan to go home the same day, you will only stay overnight if medically necessary. 6. Bring a current list of your medications and current insurance cards. 7. You MUST have a responsible person to drive you home. 8. Someone MUST be with you the first 24 hours after you arrive home or your discharge will be delayed. 9. Please wear clothes that are easy to get on and off and wear slip-on shoes.  Thank you  for allowing [place] to care for you!   -- [facility] In[facility] Cardiovascular services     Signed, Christell Constant, MD  11/15/2022 4:21 PM    [facility]

## 2022-11-23 ENCOUNTER — Telehealth: Payer: Self-pay

## 2022-11-23 NOTE — Telephone Encounter (Signed)
Informed the patient his TEE and heart cath have been rescheduled to later in the day on 11/30/2022. His arrival time is now 1030 instead of 0630 for TEE and subsequent heart catheterization.  He understands this time could change again based on lab results. He was grateful for call and agreed with plan.

## 2022-11-24 ENCOUNTER — Ambulatory Visit: Payer: 59 | Attending: Internal Medicine

## 2022-11-24 DIAGNOSIS — I351 Nonrheumatic aortic (valve) insufficiency: Secondary | ICD-10-CM

## 2022-11-24 DIAGNOSIS — I10 Essential (primary) hypertension: Secondary | ICD-10-CM

## 2022-11-25 ENCOUNTER — Telehealth: Payer: Self-pay | Admitting: *Deleted

## 2022-11-25 LAB — CBC
Hematocrit: 45.6 % (ref 37.5–51.0)
Hemoglobin: 14.6 g/dL (ref 13.0–17.7)
MCH: 26 pg — ABNORMAL LOW (ref 26.6–33.0)
MCHC: 32 g/dL (ref 31.5–35.7)
MCV: 81 fL (ref 79–97)
Platelets: 213 10*3/uL (ref 150–450)
RBC: 5.61 x10E6/uL (ref 4.14–5.80)
RDW: 12.7 % (ref 11.6–15.4)
WBC: 4.3 10*3/uL (ref 3.4–10.8)

## 2022-11-25 LAB — BASIC METABOLIC PANEL
BUN/Creatinine Ratio: 14 (ref 9–20)
BUN: 26 mg/dL — ABNORMAL HIGH (ref 6–24)
CO2: 21 mmol/L (ref 20–29)
Calcium: 9.6 mg/dL (ref 8.7–10.2)
Chloride: 100 mmol/L (ref 96–106)
Creatinine, Ser: 1.85 mg/dL — ABNORMAL HIGH (ref 0.76–1.27)
Glucose: 119 mg/dL — ABNORMAL HIGH (ref 70–99)
Potassium: 3.8 mmol/L (ref 3.5–5.2)
Sodium: 138 mmol/L (ref 134–144)
eGFR: 41 mL/min/{1.73_m2} — ABNORMAL LOW (ref >59)

## 2022-11-25 NOTE — Telephone Encounter (Signed)
11/24/22 GFR 41-per Cath Lab protocol GFR < 45 , pre-procedure hydration has been arranged. Pt instructed to arrive 11/30/22 at 8:30 AM to allow time for 1 hour registration/IV start and 4 hours IV hydration prior to [facility] 1:30 PM.  Cardiac Catheterization scheduled at [facility] for: Tuesday November 30, 2022 1:30 PM/TEE 11:30 AM Arrival time [facility] Main Entrance A at: 8:30 AM-pre-procedure hydration  Nothing to eat or drink after midnight prior to procedures.  Medication instructions: -Hold:  Chlorthalidone/Irbesartan (Avapro) day before and day of procedures-per protocol GFR 41 -Other usual morning medications can be taken with sips of water including aspirin 81 mg.  Confirmed patient has responsible adult to drive home post procedure and be with patient first 24 hours after arriving home.  Plan to go home the same day, you will only stay overnight if medically necessary.  Reviewed procedure instructions/pre-procedure hydration with patient.

## 2022-11-29 NOTE — Progress Notes (Signed)
Spoke with pt, please be at hospital at 1030 for 1130 TEE, NPO after midnight, meds with sips of water in AM, Please have transportation for home post procedure, no questions asked by pt

## 2022-11-29 NOTE — Telephone Encounter (Signed)
Reviewed procedure instructions /pre-procedure hydration with patient.

## 2022-11-30 ENCOUNTER — Encounter (HOSPITAL_COMMUNITY)
Admission: RE | Disposition: A | Discharge status: Other Acute Inpatient Hospital | Payer: Self-pay | Source: Home / Self Care | Attending: Cardiology

## 2022-11-30 ENCOUNTER — Other Ambulatory Visit: Payer: Self-pay

## 2022-11-30 ENCOUNTER — Telehealth: Payer: Self-pay | Admitting: Internal Medicine

## 2022-11-30 ENCOUNTER — Ambulatory Visit (HOSPITAL_COMMUNITY): Payer: 59 | Admitting: Anesthesiology

## 2022-11-30 ENCOUNTER — Ambulatory Visit (HOSPITAL_BASED_OUTPATIENT_CLINIC_OR_DEPARTMENT_OTHER): Payer: 59 | Admitting: Anesthesiology

## 2022-11-30 ENCOUNTER — Ambulatory Visit (HOSPITAL_COMMUNITY)
Admission: RE | Admit: 2022-11-30 | Discharge: 2022-11-30 | Disposition: A | Discharge status: Other Acute Inpatient Hospital | Payer: 59 | Attending: Cardiology | Admitting: Cardiology

## 2022-11-30 ENCOUNTER — Ambulatory Visit (HOSPITAL_BASED_OUTPATIENT_CLINIC_OR_DEPARTMENT_OTHER)
Admission: RE | Admit: 2022-11-30 | Discharge: 2022-11-30 | Disposition: A | Discharge status: Home / Self Care | Payer: 59 | Source: Ambulatory Visit | Attending: Internal Medicine | Admitting: Internal Medicine

## 2022-11-30 DIAGNOSIS — I129 Hypertensive chronic kidney disease with stage 1 through stage 4 chronic kidney disease, or unspecified chronic kidney disease: Secondary | ICD-10-CM | POA: Insufficient documentation

## 2022-11-30 DIAGNOSIS — I351 Nonrheumatic aortic (valve) insufficiency: Secondary | ICD-10-CM

## 2022-11-30 DIAGNOSIS — I2510 Atherosclerotic heart disease of native coronary artery without angina pectoris: Secondary | ICD-10-CM

## 2022-11-30 DIAGNOSIS — N189 Chronic kidney disease, unspecified: Secondary | ICD-10-CM

## 2022-11-30 DIAGNOSIS — Z6836 Body mass index (BMI) 36.0-36.9, adult: Secondary | ICD-10-CM | POA: Diagnosis not present

## 2022-11-30 DIAGNOSIS — Z7982 Long term (current) use of aspirin: Secondary | ICD-10-CM | POA: Diagnosis not present

## 2022-11-30 DIAGNOSIS — Z79899 Other long term (current) drug therapy: Secondary | ICD-10-CM | POA: Diagnosis not present

## 2022-11-30 DIAGNOSIS — Z8673 Personal history of transient ischemic attack (TIA), and cerebral infarction without residual deficits: Secondary | ICD-10-CM | POA: Insufficient documentation

## 2022-11-30 DIAGNOSIS — N183 Chronic kidney disease, stage 3 unspecified: Secondary | ICD-10-CM | POA: Insufficient documentation

## 2022-11-30 DIAGNOSIS — E785 Hyperlipidemia, unspecified: Secondary | ICD-10-CM | POA: Diagnosis not present

## 2022-11-30 HISTORY — PX: RIGHT/LEFT HEART CATH AND CORONARY ANGIOGRAPHY: CATH118266

## 2022-11-30 HISTORY — PX: TEE WITHOUT CARDIOVERSION: SHX5443

## 2022-11-30 LAB — POCT I-STAT EG7
Acid-Base Excess: 1 mmol/L (ref 0.0–2.0)
Bicarbonate: 27.6 mmol/L (ref 20.0–28.0)
Calcium, Ion: 1.26 mmol/L (ref 1.15–1.40)
HCT: 44 % (ref 39.0–52.0)
Hemoglobin: 15 g/dL (ref 13.0–17.0)
O2 Saturation: 75 %
Potassium: 3.9 mmol/L (ref 3.5–5.1)
Sodium: 141 mmol/L (ref 135–145)
TCO2: 29 mmol/L (ref 22–32)
pCO2, Ven: 48.8 mmHg (ref 44–60)
pH, Ven: 7.361 (ref 7.25–7.43)
pO2, Ven: 42 mmHg (ref 32–45)

## 2022-11-30 LAB — POCT I-STAT 7, (LYTES, BLD GAS, ICA,H+H)
Acid-Base Excess: 0 mmol/L (ref 0.0–2.0)
Bicarbonate: 25.7 mmol/L (ref 20.0–28.0)
Calcium, Ion: 1.24 mmol/L (ref 1.15–1.40)
HCT: 44 % (ref 39.0–52.0)
Hemoglobin: 15 g/dL (ref 13.0–17.0)
O2 Saturation: 94 %
Potassium: 3.8 mmol/L (ref 3.5–5.1)
Sodium: 141 mmol/L (ref 135–145)
TCO2: 27 mmol/L (ref 22–32)
pCO2 arterial: 43 mmHg (ref 32–48)
pH, Arterial: 7.384 (ref 7.35–7.45)
pO2, Arterial: 74 mmHg — ABNORMAL LOW (ref 83–108)

## 2022-11-30 LAB — ECHO TEE: P 1/2 time: 129 msec

## 2022-11-30 SURGERY — RIGHT/LEFT HEART CATH AND CORONARY ANGIOGRAPHY
Anesthesia: LOCAL

## 2022-11-30 SURGERY — ECHOCARDIOGRAM, TRANSESOPHAGEAL
Anesthesia: Monitor Anesthesia Care

## 2022-11-30 MED ORDER — LIDOCAINE HCL (PF) 1 % IJ SOLN
INTRAMUSCULAR | Status: AC
  Filled 2022-11-30: qty 30

## 2022-11-30 MED ORDER — HEPARIN SODIUM (PORCINE) 1000 UNIT/ML IJ SOLN
INTRAMUSCULAR | Status: DC | PRN
  Administered 2022-11-30: 5000 [IU] via INTRAVENOUS

## 2022-11-30 MED ORDER — SODIUM CHLORIDE 0.9 % WEIGHT BASED INFUSION
1.0000 mL/kg/h | INTRAVENOUS | Status: DC
  Administered 2022-11-30: 1 mL/kg/h via INTRAVENOUS

## 2022-11-30 MED ORDER — FENTANYL CITRATE (PF) 100 MCG/2ML IJ SOLN
INTRAMUSCULAR | Status: DC | PRN
  Administered 2022-11-30: 25 ug via INTRAVENOUS

## 2022-11-30 MED ORDER — HEPARIN (PORCINE) IN NACL 1000-0.9 UT/500ML-% IV SOLN
INTRAVENOUS | Status: DC | PRN
  Administered 2022-11-30 (×2): 500 mL

## 2022-11-30 MED ORDER — SODIUM CHLORIDE 0.9% FLUSH: 3.0000 mL | INTRAVENOUS | Status: DC | PRN

## 2022-11-30 MED ORDER — SODIUM CHLORIDE 0.9 % IV SOLN: 250.0000 mL | INTRAVENOUS | Status: DC | PRN

## 2022-11-30 MED ORDER — LIDOCAINE HCL (PF) 1 % IJ SOLN
INTRAMUSCULAR | Status: DC | PRN
  Administered 2022-11-30 (×2): 2 mL

## 2022-11-30 MED ORDER — SODIUM CHLORIDE 0.9 % WEIGHT BASED INFUSION
3.0000 mL/kg/h | INTRAVENOUS | Status: AC
  Administered 2022-11-30: 3 mL/kg/h via INTRAVENOUS

## 2022-11-30 MED ORDER — ASPIRIN 81 MG PO CHEW: 81.0000 mg | CHEWABLE_TABLET | ORAL | Status: DC

## 2022-11-30 MED ORDER — VERAPAMIL HCL 2.5 MG/ML IV SOLN
INTRAVENOUS | Status: AC
  Filled 2022-11-30: qty 2

## 2022-11-30 MED ORDER — PROPOFOL 500 MG/50ML IV EMUL
INTRAVENOUS | Status: DC | PRN
  Administered 2022-11-30: 100 ug/kg/min via INTRAVENOUS
  Administered 2022-11-30: 30 mg via INTRAVENOUS

## 2022-11-30 MED ORDER — FENTANYL CITRATE (PF) 100 MCG/2ML IJ SOLN
INTRAMUSCULAR | Status: AC
  Filled 2022-11-30: qty 2

## 2022-11-30 MED ORDER — MIDAZOLAM HCL 2 MG/2ML IJ SOLN
INTRAMUSCULAR | Status: DC | PRN
  Administered 2022-11-30: 2 mg via INTRAVENOUS

## 2022-11-30 MED ORDER — SODIUM CHLORIDE 0.9 % IV SOLN: INTRAVENOUS | Status: DC | PRN

## 2022-11-30 MED ORDER — PHENYLEPHRINE HCL-NACL 20-0.9 MG/250ML-% IV SOLN
INTRAVENOUS | Status: DC | PRN
  Administered 2022-11-30: 30 ug/min via INTRAVENOUS

## 2022-11-30 MED ORDER — ONDANSETRON HCL 4 MG/2ML IJ SOLN: 4.0000 mg | Freq: Four times a day (QID) | INTRAMUSCULAR | Status: DC | PRN

## 2022-11-30 MED ORDER — SODIUM CHLORIDE 0.9 % WEIGHT BASED INFUSION: 1.0000 mL/kg/h | INTRAVENOUS | Status: DC

## 2022-11-30 MED ORDER — IOHEXOL 350 MG/ML SOLN
INTRAVENOUS | Status: DC | PRN
  Administered 2022-11-30: 45 mL

## 2022-11-30 MED ORDER — MIDAZOLAM HCL 2 MG/2ML IJ SOLN
INTRAMUSCULAR | Status: AC
  Filled 2022-11-30: qty 2

## 2022-11-30 MED ORDER — SODIUM CHLORIDE 0.9% FLUSH: 3.0000 mL | Freq: Two times a day (BID) | INTRAVENOUS | Status: DC

## 2022-11-30 MED ORDER — HYDRALAZINE HCL 20 MG/ML IJ SOLN: 10.0000 mg | INTRAMUSCULAR | Status: DC | PRN

## 2022-11-30 MED ORDER — VERAPAMIL HCL 2.5 MG/ML IV SOLN
INTRAVENOUS | Status: DC | PRN
  Administered 2022-11-30: 10 mL via INTRA_ARTERIAL

## 2022-11-30 MED ORDER — LABETALOL HCL 5 MG/ML IV SOLN: 10.0000 mg | INTRAVENOUS | Status: DC | PRN

## 2022-11-30 MED ORDER — LIDOCAINE 2% (20 MG/ML) 5 ML SYRINGE
INTRAMUSCULAR | Status: DC | PRN
  Administered 2022-11-30: 20 mg via INTRAVENOUS

## 2022-11-30 MED ORDER — ACETAMINOPHEN 325 MG PO TABS: 650.0000 mg | ORAL_TABLET | ORAL | Status: DC | PRN

## 2022-11-30 MED ORDER — HEPARIN SODIUM (PORCINE) 1000 UNIT/ML IJ SOLN
INTRAMUSCULAR | Status: AC
  Filled 2022-11-30: qty 10

## 2022-11-30 SURGICAL SUPPLY — 13 items
CATH 5FR JL3.5 JR4 ANG PIG MP (CATHETERS) IMPLANT
CATH BALLN WEDGE 5F 110CM (CATHETERS) IMPLANT
CATH INFINITI 5FR AL1 (CATHETERS) IMPLANT
DEVICE RAD COMP TR BAND LRG (VASCULAR PRODUCTS) IMPLANT
GLIDESHEATH SLEND SS 6F .021 (SHEATH) IMPLANT
GUIDEWIRE INQWIRE 1.5J.035X260 (WIRE) IMPLANT
INQWIRE 1.5J .035X260CM (WIRE) ×1
KIT HEART LEFT (KITS) ×2 IMPLANT
PACK CARDIAC CATHETERIZATION (CUSTOM PROCEDURE TRAY) ×2 IMPLANT
SHEATH GLIDE SLENDER 4/5FR (SHEATH) IMPLANT
SHEATH PROBE COVER 6X72 (BAG) IMPLANT
TRANSDUCER W/STOPCOCK (MISCELLANEOUS) ×2 IMPLANT
TUBING CIL FLEX 10 FLL-RA (TUBING) ×2 IMPLANT

## 2022-11-30 NOTE — Anesthesia Procedure Notes (Signed)
Procedure Name: MAC Date/Time: 11/30/2022 11:18 AM  Performed by: Jodell Cipro, CRNAPre-anesthesia Checklist: Patient identified, Emergency Drugs available, Suction available and Patient being monitored Patient Re-evaluated:Patient Re-evaluated prior to induction Oxygen Delivery Method: Simple face mask Airway Equipment and Method: Bite block Placement Confirmation: positive ETCO2 Dental Injury: Teeth and Oropharynx as per pre-operative assessment

## 2022-11-30 NOTE — Telephone Encounter (Signed)
Attempted to call to discuss his procedure results Severe eccentric aortic regurgitation with isolated LAD disease.  Have been unable to reach patient.  Would recommend referral to CT surgery as his LV function is presently preserved.  Riley Lam, MD FASE [facility] Cardiologist [facility]  [address] [place], [address] [place], [place] [address] [phone]  8:55 PM

## 2022-11-30 NOTE — Interval H&P Note (Signed)
History and Physical Interval Note:  11/30/2022 2:09 PM  Carlos Hayes  has presented today for surgery, with the diagnosis of aortic stenosis.  The various methods of treatment have been discussed with the patient and family. After consideration of risks, benefits and other options for treatment, the patient has consented to  Procedure(s): RIGHT/LEFT HEART CATH AND CORONARY ANGIOGRAPHY (N/A) as a surgical intervention.  The patient's history has been reviewed, patient examined, no change in status, stable for surgery.  I have reviewed the patient's chart and labs.  Questions were answered to the patient's satisfaction.     Tonny Bollman

## 2022-11-30 NOTE — Transfer of Care (Signed)
Immediate Anesthesia Transfer of Care Note  Patient: Carlos Hayes  Procedure(s) Performed: TRANSESOPHAGEAL ECHOCARDIOGRAM  Patient Location: Cath Lab  Anesthesia Type:MAC  Level of Consciousness: drowsy  Airway & Oxygen Therapy: Patient Spontanous Breathing and Patient connected to face mask oxygen  Post-op Assessment: Report given to RN and Post -op Vital signs reviewed and stable  Post vital signs: Reviewed and stable  Last Vitals:  Vitals Value Taken Time  BP 124/73 11/30/22 1220  Temp 36.6 C 11/30/22 1211  Pulse 65 11/30/22 1229  Resp 18 11/30/22 1229  SpO2 94 % 11/30/22 1229  Vitals shown include unvalidated device data.  Last Pain:  Vitals:   11/30/22 1211  TempSrc: Temporal  PainSc: 0-No pain         Complications: No notable events documented.

## 2022-11-30 NOTE — Anesthesia Preprocedure Evaluation (Addendum)
Anesthesia Evaluation  Patient identified by MRN, date of birth, ID band Patient awake    Reviewed: Allergy & Precautions, NPO status , Patient's Chart, lab work & pertinent test results, reviewed documented beta blocker date and time   History of Anesthesia Complications Negative for: history of anesthetic complications  Airway Mallampati: III  TM Distance: >3 FB Neck ROM: Full    Dental  (+) Dental Advisory Given   Pulmonary neg pulmonary ROS   Pulmonary exam normal        Cardiovascular hypertension, Pt. on medications and Pt. on home beta blockers + Valvular Problems/Murmurs AI  Rhythm:Regular Rate:Normal + Diastolic murmurs  '24 TTE - EF 60-65%. Severe, eccentric aortic valve regurgitation directed toward anterior mitral leaflet. Holodiastolic flow reversals in the descending aorta, reversal TVI 18 cm. PHT challenging to obtain due to eccentric jet. There is mild left ventricular hypertrophy. Trivial mitral valve regurgitation. Cannot exclude a small PFO.     Neuro/Psych TIA negative psych ROS   GI/Hepatic negative GI ROS, Neg liver ROS,,,  Endo/Other    Morbid obesity  Renal/GU CRFRenal disease     Musculoskeletal negative musculoskeletal ROS (+)    Abdominal   Peds  Hematology negative hematology ROS (+)   Anesthesia Other Findings   Reproductive/Obstetrics                             Anesthesia Physical Anesthesia Plan  ASA: 4  Anesthesia Plan: MAC   Post-op Pain Management: Minimal or no pain anticipated   Induction:   PONV Risk Score and Plan: 1 and Propofol infusion and Treatment may vary due to age or medical condition  Airway Management Planned: Nasal Cannula and Natural Airway  Additional Equipment: None  Intra-op Plan:   Post-operative Plan:   Informed Consent: I have reviewed the patients History and Physical, chart, labs and discussed the procedure including  the risks, benefits and alternatives for the proposed anesthesia with the patient or authorized representative who has indicated his/her understanding and acceptance.       Plan Discussed with: CRNA and Anesthesiologist  Anesthesia Plan Comments:         Anesthesia Quick Evaluation

## 2022-11-30 NOTE — Anesthesia Postprocedure Evaluation (Signed)
Anesthesia Post Note  Patient: Carlos Hayes  Procedure(s) Performed: TRANSESOPHAGEAL ECHOCARDIOGRAM     Patient location during evaluation: PACU Anesthesia Type: MAC Level of consciousness: awake and alert Pain management: pain level controlled Vital Signs Assessment: post-procedure vital signs reviewed and stable Respiratory status: spontaneous breathing, nonlabored ventilation and respiratory function stable Cardiovascular status: stable and blood pressure returned to baseline Anesthetic complications: no   No notable events documented.  Last Vitals:  Vitals:   11/30/22 1558 11/30/22 1613  BP: (!) 159/82 (!) 148/83  Pulse: 62 61  Resp:    Temp:    SpO2: 97% 97%    Last Pain:  Vitals:   11/30/22 1522  TempSrc:   PainSc: 0-No pain                 Beryle Lathe

## 2022-11-30 NOTE — Interval H&P Note (Signed)
History and Physical Interval Note:  11/30/2022 10:11 AM  Carlos Hayes  has presented today for surgery, with the diagnosis of AORTIC INSUFFICIENCY.  The various methods of treatment have been discussed with the patient and family. After consideration of risks, benefits and other options for treatment, the patient has consented to  Procedure(s): TRANSESOPHAGEAL ECHOCARDIOGRAM (N/A) as a surgical intervention.  The patient's history has been reviewed, patient examined, no change in status, stable for surgery.  I have reviewed the patient's chart and labs.  Questions were answered to the patient's satisfaction.     [employer]

## 2022-11-30 NOTE — CV Procedure (Signed)
   Transesophageal Echocardiogram  Indications: Aortic regurgitation  Time out performed  During this procedure the patient was administered propofol under anesthesiology supervision to achieve and maintain moderate sedation.  The patient's heart rate, blood pressure, and oxygen saturation are monitored continuously during the procedure.  Coughing noted mid procedure, expedited views.  Findings:  Left Ventricle: normal EF 65-70%  Mitral Valve: No significant mitral regurgitation  Aortic Valve: Trileaflet aortic valve with severe aortic regurgitation eccentric jet toward anterior mitral leaflet.  Pressure half-time challenging secondary to eccentric jet.  Tricuspid Valve: Trivial TR  Left Atrium: Normal  Right Atrium: Normal  Intraatrial septum: Normal  Bubble Contrast Study: Not performed  Impression: Severe eccentric aortic regurgitation with normal ejection fraction    Donato Schultz, MD

## 2022-12-01 ENCOUNTER — Encounter (HOSPITAL_COMMUNITY): Payer: Self-pay | Admitting: Cardiovascular Disease

## 2022-12-01 NOTE — Addendum Note (Signed)
Addended by: Macie Burows on: 12/01/2022 05:05 PM   Modules accepted: Orders

## 2022-12-01 NOTE — Telephone Encounter (Signed)
Called pt reviewed MD message:  Attempted to call to discuss his procedure results Severe eccentric aortic regurgitation with isolated LAD disease.   Have been unable to reach patient.   Would recommend referral to CT surgery as his LV function is presently preserved.   Answered pt questions.  Advised a scheduler will call to set up appointment with CTS.

## 2022-12-04 NOTE — Progress Notes (Deleted)
[address]       [place] [address]             [phone]                    Tameron Lama [facility] Medical Record #[id number] Date of Birth: April 18, 1961  Referring: Ivonne Andrew, NP Primary Care: Ivonne Andrew, NP Primary Cardiologist: Christell Constant, MD  Chief Complaint:   No chief complaint on file.   History of Present Illness:    Carlos Hayes 62 y.o. male ***     Per recent cardiology HPI:  Carlos Hayes is a 62 y.o. male with a hx of HTN with prior HTN emergency, Moderate to severe aortic regurgitation, HTN Prior Stroke, and HLD.   2022: Started on medical therapy and increased multiple medications.  LDL near goal, BP improved.  Did not get follow up imaging and was lost to f/u in 2023. 2024: Worsening AI   Patient notes that he is doing well.     No chest pain or pressure .  No SOB/DOE and no PND/Orthopnea.  No weight gain or leg swelling.  No palpitations or syncope.   He didn't end up using the [employer] but started using an exercise bike.  Over all feels well.     Past Medical History:  Diagnosis Date   Aortic insufficiency    Chronic kidney disease, stage 3 (HCC)    Gout    Hyperlipidemia    Hypertension    TIA (transient ischemic attack)     Past Surgical History:  Procedure Laterality Date   RIGHT/LEFT HEART CATH AND CORONARY ANGIOGRAPHY N/A 11/30/2022   Procedure: RIGHT/LEFT HEART CATH AND CORONARY ANGIOGRAPHY;  Surgeon: Tonny Bollman, MD;  Location: [facility] INVASIVE CV LAB;  Service: Cardiovascular;  Laterality: N/A;   TEE WITHOUT CARDIOVERSION N/A 11/30/2022   Procedure: TRANSESOPHAGEAL ECHOCARDIOGRAM;  Surgeon: Jake Bathe, MD;  Location: [facility] INVASIVE CV LAB;  Service: Cardiovascular;  Laterality: N/A;    Family History  Problem Relation Age of Onset   Heart disease Neg Hx      Social History   Tobacco Use  Smoking Status Never  Smokeless Tobacco Never    Social History   Substance  and Sexual Activity  Alcohol Use Yes     Allergies  Allergen Reactions   Lipitor [Atorvastatin] Swelling    Causes lip to swell    Current Outpatient Medications  Medication Sig Dispense Refill   amLODipine (NORVASC) 10 MG tablet Take 1 tablet (10 mg total) by mouth daily. 90 tablet 3   APPLE CIDER VINEGAR PO Take 800 mg by mouth daily. 400 mg each     aspirin (ASPIRIN LOW DOSE) 81 MG EC tablet TAKE 1 TABLET (81 MG TOTAL) BY MOUTH DAILY. SWALLOW WHOLE. 30 tablet 0   chlorthalidone (HYGROTON) 25 MG tablet Take 1 tablet (25 mg total) by mouth daily. 90 tablet 3   Cinnamon 500 MG capsule Take 500 mg by mouth daily.     colchicine 0.6 MG tablet Take 1 tablet (0.6 mg total) by mouth daily as needed (gout). (Patient taking differently: Take 0.6 mg by mouth daily.) 30 tablet 2   cyanocobalamin (VITAMIN B12) 1000 MCG tablet Take 1,000 mcg by mouth daily.     ezetimibe (ZETIA) 10 MG tablet Take 1 tablet (10 mg total) by mouth daily. 90 tablet 3   irbesartan (AVAPRO) 150 MG tablet Take 150 mg  by mouth See admin instructions. Take with 300 mg for a total of 450 mg daily     irbesartan (AVAPRO) 300 MG tablet Take 1 tablet (300 mg total) by mouth daily. (Patient taking differently: Take 300 mg by mouth See admin instructions. Take with 150 mg for a total of 450 mg daily) 90 tablet 3   metoprolol succinate (TOPROL-XL) 25 MG 24 hr tablet Take 0.5 tablets (12.5 mg total) by mouth daily. (Patient taking differently: Take 25 mg by mouth daily.) 45 tablet 3   rosuvastatin (CRESTOR) 10 MG tablet Take 1 tablet (10 mg total) by mouth daily. 90 tablet 3   tetrahydrozoline 0.05 % ophthalmic solution Place 1 drop into both eyes daily as needed (Dry eyes).     Current Facility-Administered Medications  Medication Dose Route Frequency Provider Last Rate Last Admin   sodium chloride flush (NS) 0.9 % injection 3 mL  3 mL Intravenous Q12H Chandrasekhar, Mahesh A, MD        ROS 14 point ROS reviewed and negative  except as per HPI   PHYSICAL EXAMINATION: There were no vitals taken for this visit.  Gen: NAD Neuro: Alert and oriented CV: + diastolic murmur Resp: Nonlaboured Abd: Soft, ntnd Extr: WWP  Diagnostic Studies & Laboratory data:     Recent Radiology Findings:   CARDIAC CATHETERIZATION  Result Date: 12/01/2022 1.  Severe stenosis of the nondominant RCA 2.  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  Normal intracardiac hemodynamics with mean PA pressure 22, wedge pressure of 8, LVEDP of 12, RA pressure of 5, preserved cardiac output of 7.5 with index of 3.5, and no evidence of wide pulse pressure with an aortic pressure 133/76   ECHO TEE  Result Date: 11/30/2022    TRANSESOPHOGEAL ECHO REPORT   Patient Name:   Carlos Hayes Date of Exam: 11/30/2022 Medical Rec #:  [id number]         Height:       67.0 in Accession #:    [id number]        Weight:       230.0 lb Date of Birth:  1961/04/04         BSA:          2.146 m Patient Age:    61 years          BP:           133/74 mmHg Patient Gender: M                 HR:           83 bpm. Exam Location:  Outpatient Procedure: Limited Echo, Transesophageal Echo, Color Doppler and Cardiac Doppler Indications:     Aortic Insufficiency  History:         Patient has prior history of Echocardiogram examinations, most                  recent 11/10/2022. TIA, Aortic Valve Disease; Risk                  Factors:Hypertension and Dyslipidemia.  Sonographer:     Milbert Coulter Referring Phys:  [id number] [facility] A CHANDRASEKHAR Diagnosing Phys: Donato Schultz MD PROCEDURE: The transesophogeal probe was passed without difficulty through the esophogus of the patient. Imaged were obtained with the patient in a left lateral decubitus position. Sedation performed by different physician. The patient was monitored while under deep  sedation. Anesthestetic sedation was provided intravenously by Anesthesiology:  488.92mg  of Propofol, 20mg  of Lidocaine. Supplementary images were obtained from transthoracic windows as indicated to answer the clinical question. The patient developed Respiratory depression during the procedure.  IMPRESSIONS  1. Severe aortic regurgitation.  2. Left ventricular ejection fraction, by estimation, is 65 to 70%. The left ventricle has normal function.  3. Right ventricular systolic function is normal. The right ventricular size is normal.  4. No left atrial/left atrial appendage thrombus was detected.  5. The mitral valve is normal in structure. Trivial mitral valve regurgitation.  6. Eccentric jet . The aortic valve is tricuspid. Aortic valve regurgitation is severe. Aortic regurgitation PHT measures 129 msec. FINDINGS  Left Ventricle: Left ventricular ejection fraction, by estimation, is 65 to 70%. The left ventricle has normal function. The left ventricular internal cavity size was normal in size. Right Ventricle: The right ventricular size is normal. No increase in right ventricular wall thickness. Right ventricular systolic function is normal. Left Atrium: Left atrial size was normal in size. No left atrial/left atrial appendage thrombus was detected. Right Atrium: Right atrial size was normal in size. Pericardium: There is no evidence of pericardial effusion. Mitral Valve: The mitral valve is normal in structure. Trivial mitral valve regurgitation. Tricuspid Valve: The tricuspid valve is normal in structure. Tricuspid valve regurgitation is trivial. Aortic Valve: Eccentric jet. The aortic valve is tricuspid. Aortic valve regurgitation is severe. Aortic regurgitation PHT measures 129 msec. Pulmonic Valve: The pulmonic valve was normal in structure. Pulmonic valve regurgitation is not visualized. Aorta: The aortic root and ascending aorta are structurally normal, with no evidence of dilitation. IAS/Shunts: No atrial level shunt detected by color flow Doppler. Additional Comments: Spectral  Doppler performed. AORTIC VALVE AI PHT:      129 msec Donato Schultz MD Electronically signed by Donato Schultz MD Signature Date/Time: 11/30/2022/1:26:36 PM    Final    EP STUDY  Result Date: 11/30/2022 See surgical note for result.  ECHOCARDIOGRAM COMPLETE  Result Date: 11/11/2022    ECHOCARDIOGRAM REPORT   Patient Name:   Carlos Hayes Date of Exam: 11/10/2022 Medical Rec #:  [id number]         Height:       67.0 in Accession #:    [id number]        Weight:       231.0 lb Date of Birth:  17-Jun-1961         BSA:          2.150 m Patient Age:    61 years          BP:           146/76 mmHg Patient Gender: M                 HR:           65 bpm. Exam Location:  [address] Procedure: 2D Echo, Color Doppler and Cardiac Doppler Indications:    I35.1 Aortic Insufficiency  History:        Patient has prior history of Echocardiogram examinations, most                 recent 09/27/2020. TIA, Aortic Valve Disease; Risk                 Factors:Hypertension and Dyslipidemia.  Sonographer:    Farrel Conners RDCS Referring Phys: [facility] A CHANDRASEKHAR IMPRESSIONS  1. Severe, eccentric aortic valve regurgitation directed toward anterior mitral leaflet. Holodiastolic  flow reversals in the descending aorta, reversal TVI 18 cm. PHT challenging to obtain due to eccentric jet. The aortic valve is abnormal. Unable to determine aortic valve morphology. Aortic valve regurgitation is severe. No aortic stenosis is present.  2. Left ventricular ejection fraction, by estimation, is 60 to 65%. The left ventricle has normal function. The left ventricle has no regional wall motion abnormalities. There is mild left ventricular hypertrophy. Left ventricular diastolic parameters are indeterminate.  3. Right ventricular systolic function is normal. The right ventricular size is normal. Tricuspid regurgitation signal is inadequate for assessing PA pressure.  4. The mitral valve is normal in structure. Trivial mitral valve regurgitation. No  evidence of mitral stenosis.  5. The inferior vena cava is normal in size with greater than 50% respiratory variability, suggesting right atrial pressure of 3 mmHg.  6. Cannot exclude a small PFO. FINDINGS  Left Ventricle: Left ventricular ejection fraction, by estimation, is 60 to 65%. The left ventricle has normal function. The left ventricle has no regional wall motion abnormalities. The left ventricular internal cavity size was normal in size. There is  mild left ventricular hypertrophy. Left ventricular diastolic parameters are indeterminate. Right Ventricle: The right ventricular size is normal. No increase in right ventricular wall thickness. Right ventricular systolic function is normal. Tricuspid regurgitation signal is inadequate for assessing PA pressure. Left Atrium: Left atrial size was normal in size. Right Atrium: Right atrial size was normal in size. Pericardium: There is no evidence of pericardial effusion. Mitral Valve: The mitral valve is normal in structure. Trivial mitral valve regurgitation. No evidence of mitral valve stenosis. Tricuspid Valve: The tricuspid valve is normal in structure. Tricuspid valve regurgitation is trivial. No evidence of tricuspid stenosis. Aortic Valve: Severe, eccentric aortic valve regurgitation directed toward anterior mitral leaflet. Holodiastolic flow reversals, descending aorta diastolic reversal TVI 18 cm. The aortic valve is abnormal. Aortic valve regurgitation is severe. Aortic regurgitation PHT measures 486 msec. No aortic stenosis is present. Pulmonic Valve: The pulmonic valve was normal in structure. Pulmonic valve regurgitation is mild. No evidence of pulmonic stenosis. Aorta: The aortic root is normal in size and structure. Venous: The inferior vena cava is normal in size with greater than 50% respiratory variability, suggesting right atrial pressure of 3 mmHg. IAS/Shunts: There is redundancy of the interatrial septum. Cannot exclude a small PFO.  LEFT  VENTRICLE PLAX 2D LVIDd:         5.10 cm   Diastology LVIDs:         3.00 cm   LV e' medial:    5.88 cm/s LV PW:         1.20 cm   LV E/e' medial:  11.9 LV IVS:        1.10 cm   LV e' lateral:   7.94 cm/s LVOT diam:     2.60 cm   LV E/e' lateral: 8.8 LV SV:         96 LV SV Index:   45 LVOT Area:     5.31 cm  RIGHT VENTRICLE RV Basal diam:  3.80 cm RV S prime:     15.30 cm/s TAPSE (M-mode): 1.7 cm LEFT ATRIUM             Index        RIGHT ATRIUM           Index LA diam:        4.40 cm 2.05 cm/m   RA Pressure: 3.00 mmHg LA Vol (  A2C):   49.7 ml 23.12 ml/m  RA Area:     13.90 cm LA Vol (A4C):   30.1 ml 14.00 ml/m  RA Volume:   32.90 ml  15.30 ml/m LA Biplane Vol: 41.3 ml 19.21 ml/m  AORTIC VALVE LVOT Vmax:   86.00 cm/s LVOT Vmean:  54.500 cm/s LVOT VTI:    0.181 m AI PHT:      486 msec  AORTA Ao Root diam: 3.60 cm Ao Asc diam:  3.60 cm MITRAL VALVE               TRICUSPID VALVE MV Area (PHT): cm         Estimated RAP:  3.00 mmHg MV Decel Time: 242 msec MV E velocity: 70.20 cm/s  SHUNTS MV A velocity: 53.45 cm/s  Systemic VTI:  0.18 m MV E/A ratio:  1.31        Systemic Diam: 2.60 cm Weston Brass MD Electronically signed by Weston Brass MD Signature Date/Time: 11/11/2022/11:38:16 AM    Final        I have independently reviewed the above radiology studies  and reviewed the findings with the patient.   Recent Lab Findings: Lab Results  Component Value Date   WBC 4.3 11/24/2022   HGB 15.0 11/30/2022   HGB 15.0 11/30/2022   HCT 44.0 11/30/2022   HCT 44.0 11/30/2022   PLT 213 11/24/2022   GLUCOSE 119 (H) 11/24/2022   CHOL 133 07/08/2022   TRIG 92 07/08/2022   HDL 41 07/08/2022   LDLDIRECT 106 (H) 06/30/2021   LDLCALC 74 07/08/2022   ALT 33 07/08/2022   AST 21 07/08/2022   NA 141 11/30/2022   NA 141 11/30/2022   K 3.8 11/30/2022   K 3.9 11/30/2022   CL 100 11/24/2022   CREATININE 1.85 (H) 11/24/2022   BUN 26 (H) 11/24/2022   CO2 21 11/24/2022   TSH 1.130 06/30/2021   HGBA1C 5.9  (H) 09/27/2020     Assessment / Plan:   ***     I  spent 60 minutes with  the patient face to face in counseling and coordination of care.    Carlos Hayes Kitt Ledet 12/04/2022 4:03 PM

## 2022-12-09 ENCOUNTER — Encounter: Payer: 59 | Admitting: Cardiothoracic Surgery

## 2022-12-13 ENCOUNTER — Encounter: Payer: Self-pay | Admitting: Surgery

## 2022-12-13 ENCOUNTER — Encounter: Payer: 59 | Admitting: Surgery

## 2022-12-28 ENCOUNTER — Other Ambulatory Visit: Payer: Self-pay | Admitting: Nephrology

## 2022-12-28 DIAGNOSIS — I129 Hypertensive chronic kidney disease with stage 1 through stage 4 chronic kidney disease, or unspecified chronic kidney disease: Secondary | ICD-10-CM

## 2022-12-28 DIAGNOSIS — G459 Transient cerebral ischemic attack, unspecified: Secondary | ICD-10-CM

## 2022-12-28 DIAGNOSIS — I351 Nonrheumatic aortic (valve) insufficiency: Secondary | ICD-10-CM

## 2022-12-28 DIAGNOSIS — M109 Gout, unspecified: Secondary | ICD-10-CM

## 2022-12-28 DIAGNOSIS — N1832 Chronic kidney disease, stage 3b: Secondary | ICD-10-CM

## 2023-01-04 ENCOUNTER — Telehealth: Payer: Self-pay

## 2023-01-04 NOTE — Progress Notes (Signed)
Patient attempted to be outreached by Abrian Hanover, PharmD Candidate on 01/04/23 to discuss hypertension. Left voicemail for patient to return our call at their convenience at [phone].  Alexia Dinger, Student-PharmD 

## 2023-01-05 ENCOUNTER — Encounter: Payer: 59 | Admitting: Surgery

## 2023-01-06 ENCOUNTER — Ambulatory Visit: Payer: Self-pay | Admitting: Nurse Practitioner

## 2023-02-14 NOTE — Progress Notes (Deleted)
Office Visit    Patient Name: Carlos Hayes Date of Encounter: 02/14/2023  Primary Care Provider:  Ivonne Andrew, NP Primary Cardiologist:  Carlos Constant, MD Primary Electrophysiologist: None   Past Medical History    Past Medical History:  Diagnosis Date   Aortic insufficiency    Chronic kidney disease, stage 3 (HCC)    Gout    Hyperlipidemia    Hypertension    TIA (transient ischemic attack)    Past Surgical History:  Procedure Laterality Date   RIGHT/LEFT HEART CATH AND CORONARY ANGIOGRAPHY N/A 11/30/2022   Procedure: RIGHT/LEFT HEART CATH AND CORONARY ANGIOGRAPHY;  Surgeon: Tonny Bollman, MD;  Location: [facility] INVASIVE CV LAB;  Service: Cardiovascular;  Laterality: N/A;   TEE WITHOUT CARDIOVERSION N/A 11/30/2022   Procedure: TRANSESOPHAGEAL ECHOCARDIOGRAM;  Surgeon: Jake Bathe, MD;  Location: [facility] INVASIVE CV LAB;  Service: Cardiovascular;  Laterality: N/A;    Allergies  Allergies  Allergen Reactions   Lipitor [Atorvastatin] Swelling    Causes lip to swell     History of Present Illness    Carlos Hayes  is a 62 year old male with a PMH of nonrheumatic AI, stage III CKD, morbid obesity, TIA, HLD, prediabetes, HTN who presents today for 29-month follow-up.  Carlos Hayes was seen initially by Dr. Raynelle Jan in 10/2020 for complaint of TIA.  He presented to the ED with complaint of slurred speech and CT was completed that was normal with MRI showing scattered cerebral microbleeds.  MRA was unremarkable and patient was started on DAPT x 3 weeks then with ASA alone.  2D echo was completed showing EF of 55 to 60% with grade 2 DD and moderate LAE with mild to moderate RAE and moderate to severe AI.  2D echo was repeated on 11/2022 confirming severe AI without LV dilation or dysfunction.  Patient underwent LHC/RHC as well as TEE due to severe AVR in anticipation of TAVR.  TEE showed EF of 65 to 70% with severe AR with trivial MVR.  R/LHC was performed showing  severe stenosis of nondominant RCA with patent LAD, circumflex, intermediate branch with nonobstructive CAD.  Following patient's test recommendation was made to CT surgery for further evaluation.   Since last being seen in the office patient reports***.  Patient denies chest pain, palpitations, dyspnea, PND, orthopnea, nausea, vomiting, dizziness, syncope, edema, weight gain, or early satiety.     ***Notes:  Home Medications    Current Outpatient Medications  Medication Sig Dispense Refill   amLODipine (NORVASC) 10 MG tablet Take 1 tablet (10 mg total) by mouth daily. 90 tablet 3   APPLE CIDER VINEGAR PO Take 800 mg by mouth daily. 400 mg each     aspirin (ASPIRIN LOW DOSE) 81 MG EC tablet TAKE 1 TABLET (81 MG TOTAL) BY MOUTH DAILY. SWALLOW WHOLE. 30 tablet 0   chlorthalidone (HYGROTON) 25 MG tablet Take 1 tablet (25 mg total) by mouth daily. 90 tablet 3   Cinnamon 500 MG capsule Take 500 mg by mouth daily.     colchicine 0.6 MG tablet Take 1 tablet (0.6 mg total) by mouth daily as needed (gout). (Patient taking differently: Take 0.6 mg by mouth daily.) 30 tablet 2   cyanocobalamin (VITAMIN B12) 1000 MCG tablet Take 1,000 mcg by mouth daily.     ezetimibe (ZETIA) 10 MG tablet Take 1 tablet (10 mg total) by mouth daily. 90 tablet 3   irbesartan (AVAPRO) 150 MG tablet Take 150 mg by mouth See  admin instructions. Take with 300 mg for a total of 450 mg daily     irbesartan (AVAPRO) 300 MG tablet Take 1 tablet (300 mg total) by mouth daily. (Patient taking differently: Take 300 mg by mouth See admin instructions. Take with 150 mg for a total of 450 mg daily) 90 tablet 3   metoprolol succinate (TOPROL-XL) 25 MG 24 hr tablet Take 0.5 tablets (12.5 mg total) by mouth daily. (Patient taking differently: Take 25 mg by mouth daily.) 45 tablet 3   rosuvastatin (CRESTOR) 10 MG tablet Take 1 tablet (10 mg total) by mouth daily. 90 tablet 3   tetrahydrozoline 0.05 % ophthalmic solution Place 1 drop into  both eyes daily as needed (Dry eyes).     Current Facility-Administered Medications  Medication Dose Route Frequency Provider Last Rate Last Admin   sodium chloride flush (NS) 0.9 % injection 3 mL  3 mL Intravenous Q12H Chandrasekhar, Mahesh A, MD         Review of Systems  Please see the history of present illness.    (+)*** (+)***  All other systems reviewed and are otherwise negative except as noted above.  Physical Exam    Wt Readings from Last 3 Encounters:  11/30/22 230 lb (104.3 kg)  11/15/22 232 lb (105.2 kg)  09/15/22 231 lb (104.8 kg)   ZO:XWRUE were no vitals filed for this visit.,There is no height or weight on file to calculate BMI.  Constitutional:      Appearance: Healthy appearance. Not in distress.  Neck:     Vascular: JVD normal.  Pulmonary:     Effort: Pulmonary effort is normal.     Breath sounds: No wheezing. No rales. Diminished in the bases Cardiovascular:     Normal rate. Regular rhythm. Normal S1. Normal S2.      Murmurs: There is no murmur.  Edema:    Peripheral edema absent.  Abdominal:     Palpations: Abdomen is soft non tender. There is no hepatomegaly.  Skin:    General: Skin is warm and dry.  Neurological:     General: No focal deficit present.     Mental Status: Alert and oriented to person, place and time.     Cranial Nerves: Cranial nerves are intact.  EKG/LABS/ Recent Cardiac Studies    ECG personally reviewed by me today - ***   Risk Assessment/Calculations:   {Does this patient have ATRIAL FIBRILLATION?:[phone]}        Lab Results  Component Value Date   WBC 4.3 11/24/2022   HGB 15.0 11/30/2022   HGB 15.0 11/30/2022   HCT 44.0 11/30/2022   HCT 44.0 11/30/2022   MCV 81 11/24/2022   PLT 213 11/24/2022   Lab Results  Component Value Date   CREATININE 1.85 (H) 11/24/2022   BUN 26 (H) 11/24/2022   NA 141 11/30/2022   NA 141 11/30/2022   K 3.8 11/30/2022   K 3.9 11/30/2022   CL 100 11/24/2022   CO2 21  11/24/2022   Lab Results  Component Value Date   ALT 33 07/08/2022   AST 21 07/08/2022   ALKPHOS 88 07/08/2022   BILITOT 0.5 07/08/2022   Lab Results  Component Value Date   CHOL 133 07/08/2022   HDL 41 07/08/2022   LDLCALC 74 07/08/2022   LDLDIRECT 106 (H) 06/30/2021   TRIG 92 07/08/2022   CHOLHDL 3.2 07/08/2022    Lab Results  Component Value Date   HGBA1C 5.9 (H) 09/27/2020  Assessment & Plan    1.  Nonrheumatic AVR:  2.  Essential hypertension:  3.  Hyperlipidemia:  4.  CKD stage III:  5.  Morbid obesity:      Disposition: Follow-up with Carlos Constant, MD or APP in *** months {Are you ordering a CV Procedure (e.g. stress test, cath, DCCV, TEE, etc)?   Press F2        :[id number]}   Medication Adjustments/Labs and Tests Ordered: Current medicines are reviewed at length with the patient today.  Concerns regarding medicines are outlined above.   Signed, Napoleon Form, Leodis Rains, NP 02/14/2023, 1:26 PM [facility]

## 2023-02-15 ENCOUNTER — Ambulatory Visit: Payer: 59 | Attending: Nurse Practitioner | Admitting: Nurse Practitioner

## 2023-02-15 DIAGNOSIS — N183 Chronic kidney disease, stage 3 unspecified: Secondary | ICD-10-CM

## 2023-02-15 DIAGNOSIS — G459 Transient cerebral ischemic attack, unspecified: Secondary | ICD-10-CM

## 2023-02-15 DIAGNOSIS — I10 Essential (primary) hypertension: Secondary | ICD-10-CM

## 2023-02-15 DIAGNOSIS — E785 Hyperlipidemia, unspecified: Secondary | ICD-10-CM

## 2023-02-15 DIAGNOSIS — I351 Nonrheumatic aortic (valve) insufficiency: Secondary | ICD-10-CM

## 2023-04-28 ENCOUNTER — Telehealth: Payer: Self-pay

## 2023-04-28 NOTE — Patient Outreach (Signed)
Care Guide Note  04/28/2023 Name: Carlos Hayes MRN: [id number] DOB: [birth year]  Referred by: Ivonne Andrew, NP Reason for referral : patient outreach (Outreach to schedule with RPH./)   Carlos Hayes is a 62 y.o. year old male who is a primary care patient of Ivonne Andrew, NP. Carlos Hayes was referred to the pharmacist for assistance related to HTN.    An unsuccessful telephone outreach was attempted today to contact the patient who was referred to the pharmacy team for assistance with HTN. Additional attempts will be made to contact the patient.   Carlos Hayes [facility] Assistant-Population Health [phone]

## 2023-06-28 ENCOUNTER — Ambulatory Visit: Payer: 59 | Attending: Cardiology | Admitting: Cardiology

## 2023-06-28 ENCOUNTER — Encounter: Payer: Self-pay | Admitting: Cardiology

## 2023-06-28 VITALS — BP 136/60 | HR 70 | Resp 16 | Ht 67.0 in | Wt 226.6 lb

## 2023-06-28 DIAGNOSIS — I2510 Atherosclerotic heart disease of native coronary artery without angina pectoris: Secondary | ICD-10-CM | POA: Diagnosis not present

## 2023-06-28 DIAGNOSIS — E782 Mixed hyperlipidemia: Secondary | ICD-10-CM | POA: Diagnosis not present

## 2023-06-28 DIAGNOSIS — I351 Nonrheumatic aortic (valve) insufficiency: Secondary | ICD-10-CM | POA: Diagnosis not present

## 2023-06-28 DIAGNOSIS — G459 Transient cerebral ischemic attack, unspecified: Secondary | ICD-10-CM | POA: Diagnosis not present

## 2023-06-28 NOTE — Patient Instructions (Signed)
Medication Instructions:  Your physician recommends that you continue on your current medications as directed. Please refer to the Current Medication list given to you today.  *If you need a refill on your cardiac medications before your next appointment, please call your pharmacy*  Lab Work: CMET, LDL direct-TODAY If you have labs (blood work) drawn today and your tests are completely normal, you will receive your results only by: MyChart Message (if you have MyChart) OR A paper copy in the mail If you have any lab test that is abnormal or we need to change your treatment, we will call you to review the results.  Testing/Procedures: Your physician has requested that you have an echocardiogram. Echocardiography is a painless test that uses sound waves to create images of your heart. It provides your doctor with information about the size and shape of your heart and how well your heart's chambers and valves are working. This procedure takes approximately one hour. There are no restrictions for this procedure. Please do NOT wear cologne, perfume, aftershave, or lotions (deodorant is allowed). Please arrive 15 minutes prior to your appointment time.  Please note: We ask at that you not bring children with you during ultrasound (echo/ vascular) testing. Due to room size and safety concerns, children are not allowed in the ultrasound rooms during exams. Our front office staff cannot provide observation of children in our lobby area while testing is being conducted. An adult accompanying a patient to their appointment will only be allowed in the ultrasound room at the discretion of the ultrasound technician under special circumstances. We apologize for any inconvenience.    Follow-Up: At [facility], you and your health needs are our priority.  As part of our continuing mission to provide you with exceptional heart care, we have created designated Provider Care Teams.  These Care Teams  include your primary Cardiologist (physician) and Advanced Practice Providers (APPs -  Physician Assistants and Nurse Practitioners) who all work together to provide you with the care you need, when you need it.  We recommend signing up for the patient portal called "MyChart".  Sign up information is provided on this After Visit Summary.  MyChart is used to connect with patients for Virtual Visits (Telemedicine).  Patients are able to view lab/test results, encounter notes, upcoming appointments, etc.  Non-urgent messages can be sent to your provider as well.   To learn more about what you can do with MyChart, go to [email or web address].    Your next appointment:   3-4 month(s)  Provider:   Christell Constant, MD     Other Instructions You have been referred to CT surgery.

## 2023-06-28 NOTE — Progress Notes (Signed)
Cardiology Office Note:   Date:  06/28/2023  ID:  Paxtin Yax, DOB November 08, 1960, MRN [id number] PCP: Ivonne Andrew, NP  [facility] Providers Cardiologist:  Christell Constant, MD    History of Present Illness:   Discussed the use of AI scribe software for clinical note transcription with the patient, who gave verbal consent to proceed.  History of Present Illness   This is a 62 year old patient with a history of hypertension, severe aortic regurgitation, stroke, and hyperlipidemia. Patient seen in 2022 when multiple medications were adjusted resulting in improved blood pressure and near-goal LDL levels. However, the patient was lost to follow-up until early 2024. At April 2024 follow up, given the history of severe aortic regurgitation, a left heart catheterization and transesophageal echocardiogram were arranged. 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 The patient's transesophageal echocardiogram showed severe aortic regurgitation with eccentric jets. Attempts made to reach patient about CT surgery referral but he was not able to be reached and again lost to follow up.   Today the patient reports feeling well since the last visit in April, with no shortness of breath or chest pain, but notes increased fatigue with exertion. He reports being busy helping his girlfriend move and also denies any chest pain or significant fatigue during this activity. The patient denies lightheadedness, dizziness, orthopnea/PND, LE edema.   Since his last visit with [place], patient's Irbesartan increased to 450mg  (unknown by patient who made this change). The patient is also followed by [facility]. Unsure of last lab checks.      Today patient denies chest pain, shortness of breath, lower extremity  edema, palpitations, melena, hematuria, hemoptysis, diaphoresis, weakness, presyncope, syncope, orthopnea, and PND. Only reported symptom fatigue/decreased exertional tolerance.  Studies Reviewed:     Cardiac Studies & Procedures   CARDIAC CATHETERIZATION  CARDIAC CATHETERIZATION 11/30/2022  Narrative 1.  Severe stenosis of the nondominant RCA 2.  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  Normal intracardiac hemodynamics with mean PA pressure 22, wedge pressure of 8, LVEDP of 12, RA pressure of 5, preserved cardiac output of 7.5 with index of 3.5, and no evidence of wide pulse pressure with an aortic pressure 133/76  Findings Coronary Findings Diagnostic  Dominance: Left  Left Main Dist LM to Ost LAD lesion is 30% stenosed.  Left Anterior Descending Mid LAD lesion is 40% stenosed.  Ramus Intermedius There is mild diffuse disease throughout the vessel.  Left Circumflex There is mild diffuse disease throughout the vessel.  Second Obtuse Marginal Branch Tortuous but patent vessel, no obstructive disease.  Right Coronary Artery There is mild diffuse disease throughout the vessel. Nondominant but moderate caliber RCA, tortuous vessel, tight focal 80% stenosis in the midportion not amenable to PCI.  The RCA supplies an acute marginal and a conus branch. Prox RCA lesion is 80% stenosed.  Intervention  No interventions have been documented.     ECHOCARDIOGRAM  ECHOCARDIOGRAM COMPLETE 11/10/2022  Narrative ECHOCARDIOGRAM REPORT    Patient Name:   Arjunreddy Balthaser Date of Exam: 11/10/2022 Medical Rec #:  [id number]         Height:       67.0 in Accession #:    [id number]        Weight:       231.0  lb Date of Birth:  [birth year]         BSA:          2.150 m Patient Age:    61 years          BP:           146/76 mmHg Patient Gender: M                 HR:           65 bpm. Exam Location:  [address]  Procedure: 2D Echo, Color Doppler and Cardiac Doppler  Indications:    I35.1 Aortic Insufficiency  History:        Patient has prior history of Echocardiogram examinations, most recent 09/27/2020. TIA, Aortic Valve Disease; Risk Factors:Hypertension and Dyslipidemia.  Sonographer:    Farrel Conners RDCS Referring Phys: [facility] A CHANDRASEKHAR  IMPRESSIONS   1. Severe, eccentric aortic valve regurgitation directed toward anterior mitral leaflet. Holodiastolic flow reversals in the descending aorta, reversal TVI 18 cm. PHT challenging to obtain due to eccentric jet. The aortic valve is abnormal. Unable to determine aortic valve morphology. Aortic valve regurgitation is severe. No aortic stenosis is present. 2. Left ventricular ejection fraction, by estimation, is 60 to 65%. The left ventricle has normal function. The left ventricle has no regional wall motion abnormalities. There is mild left ventricular hypertrophy. Left ventricular diastolic parameters are indeterminate. 3. Right ventricular systolic function is normal. The right ventricular size is normal. Tricuspid regurgitation signal is inadequate for assessing PA pressure. 4. The mitral valve is normal in structure. Trivial mitral valve regurgitation. No evidence of mitral stenosis. 5. The inferior vena cava is normal in size with greater than 50% respiratory variability, suggesting right atrial pressure of 3 mmHg. 6. Cannot exclude a small PFO.  FINDINGS Left Ventricle: Left ventricular ejection fraction, by estimation, is 60 to 65%. The left ventricle has normal function. The left ventricle has no regional wall motion abnormalities. The left ventricular internal cavity size was normal in size. There is mild left ventricular hypertrophy. Left ventricular diastolic parameters are indeterminate.  Right Ventricle: The right ventricular size is normal. No increase in right ventricular wall thickness. Right ventricular systolic  function is normal. Tricuspid regurgitation signal is inadequate for assessing PA pressure.  Left Atrium: Left atrial size was normal in size.  Right Atrium: Right atrial size was normal in size.  Pericardium: There is no evidence of pericardial effusion.  Mitral Valve: The mitral valve is normal in structure. Trivial mitral valve regurgitation. No evidence of mitral valve stenosis.  Tricuspid Valve: The tricuspid valve is normal in structure. Tricuspid valve regurgitation is trivial. No evidence of tricuspid stenosis.  Aortic Valve: Severe, eccentric aortic valve regurgitation directed toward anterior mitral leaflet. Holodiastolic flow reversals, descending aorta diastolic reversal TVI 18 cm. The aortic valve is abnormal. Aortic valve regurgitation is severe. Aortic regurgitation PHT measures 486 msec. No aortic stenosis is present.  Pulmonic Valve: The pulmonic valve was normal in structure. Pulmonic valve regurgitation is mild. No evidence of pulmonic stenosis.  Aorta: The aortic root is normal in size and structure.  Venous: The inferior vena cava is normal in size with greater than 50% respiratory variability, suggesting right atrial pressure of 3 mmHg.  IAS/Shunts: There is redundancy of the interatrial septum. Cannot exclude a small PFO.   LEFT VENTRICLE PLAX 2D LVIDd:         5.10 cm   Diastology LVIDs:  3.00 cm   LV e' medial:    5.88 cm/s LV PW:         1.20 cm   LV E/e' medial:  11.9 LV IVS:        1.10 cm   LV e' lateral:   7.94 cm/s LVOT diam:     2.60 cm   LV E/e' lateral: 8.8 LV SV:         96 LV SV Index:   45 LVOT Area:     5.31 cm   RIGHT VENTRICLE RV Basal diam:  3.80 cm RV S prime:     15.30 cm/s TAPSE (M-mode): 1.7 cm  LEFT ATRIUM             Index        RIGHT ATRIUM           Index LA diam:        4.40 cm 2.05 cm/m   RA Pressure: 3.00 mmHg LA Vol (A2C):   49.7 ml 23.12 ml/m  RA Area:     13.90 cm LA Vol (A4C):   30.1 ml 14.00 ml/m  RA  Volume:   32.90 ml  15.30 ml/m LA Biplane Vol: 41.3 ml 19.21 ml/m AORTIC VALVE LVOT Vmax:   86.00 cm/s LVOT Vmean:  54.500 cm/s LVOT VTI:    0.181 m AI PHT:      486 msec  AORTA Ao Root diam: 3.60 cm Ao Asc diam:  3.60 cm  MITRAL VALVE               TRICUSPID VALVE MV Area (PHT): cm         Estimated RAP:  3.00 mmHg MV Decel Time: 242 msec MV E velocity: 70.20 cm/s  SHUNTS MV A velocity: 53.45 cm/s  Systemic VTI:  0.18 m MV E/A ratio:  1.31        Systemic Diam: 2.60 cm  Weston Brass MD Electronically signed by Weston Brass MD Signature Date/Time: 11/11/2022/11:38:16 AM    Final   TEE  ECHO TEE 11/30/2022  Narrative TRANSESOPHOGEAL ECHO REPORT    Patient Name:   NAOKI HUCKEBA Date of Exam: 11/30/2022 Medical Rec #:  [id number]         Height:       67.0 in Accession #:    [id number]        Weight:       230.0 lb Date of Birth:  August 31, 1960         BSA:          2.146 m Patient Age:    61 years          BP:           133/74 mmHg Patient Gender: M                 HR:           83 bpm. Exam Location:  Outpatient  Procedure: Limited Echo, Transesophageal Echo, Color Doppler and Cardiac Doppler  Indications:     Aortic Insufficiency  History:         Patient has prior history of Echocardiogram examinations, most recent 11/10/2022. TIA, Aortic Valve Disease; Risk Factors:Hypertension and Dyslipidemia.  Sonographer:     Milbert Coulter Referring Phys:  [id number] [facility] A CHANDRASEKHAR Diagnosing Phys: Donato Schultz MD  PROCEDURE: The transesophogeal probe was passed without difficulty through the esophogus of the patient. Imaged were obtained with the patient in a left lateral decubitus  position. Sedation performed by different physician. The patient was monitored while under deep sedation. Anesthestetic sedation was provided intravenously by Anesthesiology: 488.92mg  of Propofol, 20mg  of Lidocaine. Supplementary images were obtained from transthoracic windows as  indicated to answer the clinical question. The patient developed Respiratory depression during the procedure.  IMPRESSIONS   1. Severe aortic regurgitation. 2. Left ventricular ejection fraction, by estimation, is 65 to 70%. The left ventricle has normal function. 3. Right ventricular systolic function is normal. The right ventricular size is normal. 4. No left atrial/left atrial appendage thrombus was detected. 5. The mitral valve is normal in structure. Trivial mitral valve regurgitation. 6. Eccentric jet . The aortic valve is tricuspid. Aortic valve regurgitation is severe. Aortic regurgitation PHT measures 129 msec.  FINDINGS Left Ventricle: Left ventricular ejection fraction, by estimation, is 65 to 70%. The left ventricle has normal function. The left ventricular internal cavity size was normal in size.  Right Ventricle: The right ventricular size is normal. No increase in right ventricular wall thickness. Right ventricular systolic function is normal.  Left Atrium: Left atrial size was normal in size. No left atrial/left atrial appendage thrombus was detected.  Right Atrium: Right atrial size was normal in size.  Pericardium: There is no evidence of pericardial effusion.  Mitral Valve: The mitral valve is normal in structure. Trivial mitral valve regurgitation.  Tricuspid Valve: The tricuspid valve is normal in structure. Tricuspid valve regurgitation is trivial.  Aortic Valve: Eccentric jet. The aortic valve is tricuspid. Aortic valve regurgitation is severe. Aortic regurgitation PHT measures 129 msec.  Pulmonic Valve: The pulmonic valve was normal in structure. Pulmonic valve regurgitation is not visualized.  Aorta: The aortic root and ascending aorta are structurally normal, with no evidence of dilitation.  IAS/Shunts: No atrial level shunt detected by color flow Doppler.  Additional Comments: Spectral Doppler performed.  AORTIC VALVE AI PHT:      129 msec  Donato Schultz MD Electronically signed by Donato Schultz MD Signature Date/Time: 11/30/2022/1:26:36 PM    Final             Risk Assessment/Calculations:              Physical Exam:   VS:  BP 136/60   Pulse 70   Resp 16   Ht 5\' 7"  (1.702 m)   Wt 226 lb 9.6 oz (102.8 kg)   SpO2 96%   BMI 35.49 kg/m    Wt Readings from Last 3 Encounters:  06/28/23 226 lb 9.6 oz (102.8 kg)  11/30/22 230 lb (104.3 kg)  11/15/22 232 lb (105.2 kg)     Physical Exam Vitals reviewed.  Constitutional:      Appearance: Normal appearance.  HENT:     Head: Normocephalic.  Eyes:     Pupils: Pupils are equal, round, and reactive to light.  Cardiovascular:     Rate and Rhythm: Normal rate and regular rhythm.     Pulses: Normal pulses.     Heart sounds: Murmur (holodiastolic murmur) heard.  Pulmonary:     Effort: Pulmonary effort is normal.     Breath sounds: Normal breath sounds.  Musculoskeletal:     Right lower leg: No edema.     Left lower leg: No edema.  Skin:    General: Skin is warm and dry.     Capillary Refill: Capillary refill takes less than 2 seconds.  Neurological:     General: No focal deficit present.     Mental Status: He is  alert and oriented to person, place, and time.  Psychiatric:        Mood and Affect: Mood normal.        Behavior: Behavior normal.        Thought Content: Thought content normal.        Judgment: Judgment normal.      ASSESSMENT AND PLAN:     Assessment and Plan    Severe Aortic Regurgitation Severe aortic regurgitation with eccentric jets confirmed by transesophageal echocardiogram. Largely asymptomatic (fatigue likely 2/2 regurgitation) and euvolemic but at risk for heart failure. Discussed the need for surgical evaluation for possible repair. Emphasized the importance of follow-up with CT surgery. - Order repeat echocardiogram - Refer to cardiothoracic surgery for valve repair and coronary artery disease management  Coronary Artery  Disease Tight/severe stenosis (80%) of a nondominant right coronary artery not amenable to PCI with nonobstructive disease in other vessels on April LHC. Asymptomatic.  - Continue low dose aspirin therapy - Refer to cardiothoracic surgery for evaluation and potential intervention given concurrent severe aortic regurgitation. - Cholesterol management as below  Hypertension Hypertension with recent medication adjustment by unknown provider. Blood pressure okay today at 130/60. Discussed the importance of maintaining blood pressure control to prevent further cardiac complications. - Continue current antihypertensive regimen: chlorthalidone 25 mg, irbesartan 450 mg, toprol 12.5 mg - Monitor blood pressure at home - Check renal function today, will plan to forward to his nephrologist.  Hyperlipidemia LDL last check December 2023 prior to [facility]. LDL at that time 74. Ideally LDL <55 (minimally <70) with CAD. Discussed the importance of maintaining LDL levels to reduce the risk of coronary artery disease progression. - Check direct LDL as patient not fasting - Continue Crestor 10 mg and Zetia 10 mg - Check LFTs  TIA Continue ASA 81mg , Crestor/Zetia.  General Health Maintenance Routine health maintenance including cholesterol monitoring. - Check cholesterol levels to ensure appropriate dosing of Crestor  Follow-up - Follow up with Dr. Izora Ribas in 3 months - Order repeat echocardiogram - Refer to CT surgery and ensure follow-up.             Signed, Perlie Gold, PA-C

## 2023-06-29 LAB — COMPREHENSIVE METABOLIC PANEL
ALT: 39 [IU]/L (ref 0–44)
AST: 27 [IU]/L (ref 0–40)
Albumin: 4.4 g/dL (ref 3.8–4.9)
Alkaline Phosphatase: 74 [IU]/L (ref 44–121)
BUN/Creatinine Ratio: 14 (ref 10–24)
BUN: 23 mg/dL (ref 8–27)
Bilirubin Total: 0.4 mg/dL (ref 0.0–1.2)
CO2: 25 mmol/L (ref 20–29)
Calcium: 9.6 mg/dL (ref 8.6–10.2)
Chloride: 100 mmol/L (ref 96–106)
Creatinine, Ser: 1.69 mg/dL — ABNORMAL HIGH (ref 0.76–1.27)
Globulin, Total: 3 g/dL (ref 1.5–4.5)
Glucose: 88 mg/dL (ref 70–99)
Potassium: 4 mmol/L (ref 3.5–5.2)
Sodium: 139 mmol/L (ref 134–144)
Total Protein: 7.4 g/dL (ref 6.0–8.5)
eGFR: 46 mL/min/{1.73_m2} — ABNORMAL LOW (ref >59)

## 2023-06-29 LAB — LDL CHOLESTEROL, DIRECT: LDL Direct: 74 mg/dL (ref 0–99)

## 2023-07-04 ENCOUNTER — Telehealth: Payer: Self-pay

## 2023-07-04 DIAGNOSIS — E782 Mixed hyperlipidemia: Secondary | ICD-10-CM

## 2023-07-04 MED ORDER — ROSUVASTATIN CALCIUM 20 MG PO TABS: 20.0000 mg | ORAL_TABLET | Freq: Every day | ORAL | 3 refills | Status: AC

## 2023-07-04 NOTE — Telephone Encounter (Signed)
-----   Message from Perlie Gold sent at 06/30/2023 11:11 AM EST ----- LDL 74 is above goal of <55. Would recommend increasing Crestor to 20mg  daily and repeating fasting lipid panel with LFTs in 6-8 weeks. Renal function/liver function stable. Please fax CMP to patient's nephrologist.

## 2023-07-04 NOTE — Telephone Encounter (Signed)
The patient has been notified of the result and verbalized understanding.  All questions (if any) were answered. Frutoso Schatz, RN 07/04/2023 1:16 PM

## 2023-07-11 ENCOUNTER — Other Ambulatory Visit: Payer: Self-pay | Admitting: Internal Medicine

## 2023-07-14 ENCOUNTER — Telehealth: Payer: Self-pay | Admitting: Internal Medicine

## 2023-07-14 NOTE — Telephone Encounter (Signed)
Patient dropped off Renewal of disability parking placecard, form. Will put in Dr. Izora Ribas box by the end of the day. Thank you

## 2023-07-15 NOTE — Telephone Encounter (Signed)
Called number on file voice mail not set up.  Unable to leave a message. Would like to inform pt handicap placard paperwork is signed and at the front desk for pick up.

## 2023-07-18 NOTE — Telephone Encounter (Signed)
Called pt advised paperwork is at the front desk for pick up.  Pt had no questions or concerns.

## 2023-08-11 ENCOUNTER — Ambulatory Visit (HOSPITAL_COMMUNITY): Payer: 59 | Attending: Cardiology

## 2023-08-11 DIAGNOSIS — I351 Nonrheumatic aortic (valve) insufficiency: Secondary | ICD-10-CM | POA: Diagnosis present

## 2023-08-11 LAB — ECHOCARDIOGRAM COMPLETE
Area-P 1/2: 4.23 cm2
S' Lateral: 3.3 cm

## 2023-08-11 MED ORDER — PERFLUTREN LIPID MICROSPHERE
1.0000 mL | INTRAVENOUS | Status: AC | PRN
  Administered 2023-08-11: 2 mL via INTRAVENOUS

## 2023-08-24 ENCOUNTER — Institutional Professional Consult (permissible substitution) (INDEPENDENT_AMBULATORY_CARE_PROVIDER_SITE_OTHER): Payer: 59 | Admitting: Surgery

## 2023-08-24 ENCOUNTER — Encounter: Payer: Self-pay | Admitting: Surgery

## 2023-08-24 VITALS — BP 169/87 | HR 75 | Resp 18 | Ht 67.0 in | Wt 226.0 lb

## 2023-08-24 DIAGNOSIS — I351 Nonrheumatic aortic (valve) insufficiency: Secondary | ICD-10-CM | POA: Diagnosis not present

## 2023-08-24 NOTE — Progress Notes (Signed)
 Cardiothoracic Surgery Consultation  PCP is Jerrlyn Morel, NP Referring Provider is Leala Prince, PA-C  Chief Complaint  Patient presents with   Consult    Review cath and ECHO    HPI:  The patient is a 63 year old gentleman with a history of hypertension, hyperlipidemia, stage III chronic kidney disease, previous TIA, and aortic insufficiency that has been followed by cardiology.  He has been lost to follow-up for periods of time.  He had a TEE on 11/30/2022 showing a trileaflet aortic valve with severe regurgitation.  There was an eccentric jet with a pressure half-time of 129 ms.  Left ventricular ejection fraction was 65 to 70% with no LV dilation.  He underwent cardiac catheterization on the same day showing a 80% stenosis of a small nondominant right coronary artery.  There was no significant obstructive coronary disease in any of the other vessels.  Left and right heart pressures were within normal limits with an LVEDP of 12 and a cardiac index of 3.5.  The pulse pressure was not wide at 133/76.  His most recent echo on 08/11/2023 continue to show severe eccentric AI with holodiastolic flow reversal in the descending aorta.  Left ventricular systolic function was normal with a diastolic diameter of 5 cm.  Aortic root diameter was measured at 3.5 cm with an ascending aortic diameter 3.8 cm.  The patient is here today with his brother.  The patient lives by himself.  He reports development of some fatigue and tiredness with activity.  He denies any shortness of breath or orthopnea.  He has had no peripheral edema.  He denies any chest pain or pressure.  Has had no dizziness or syncope.  He is unemployed and not physically very active.  Past Medical History:  Diagnosis Date   Aortic insufficiency    Chronic kidney disease, stage 3 (HCC)    Gout    Hyperlipidemia    Hypertension    TIA (transient ischemic attack)     Past Surgical History:  Procedure Laterality Date   RIGHT/LEFT  HEART CATH AND CORONARY ANGIOGRAPHY N/A 11/30/2022   Procedure: RIGHT/LEFT HEART CATH AND CORONARY ANGIOGRAPHY;  Surgeon: Arnoldo Lapping, MD;  Location: [facility] INVASIVE CV LAB;  Service: Cardiovascular;  Laterality: N/A;   TEE WITHOUT CARDIOVERSION N/A 11/30/2022   Procedure: TRANSESOPHAGEAL ECHOCARDIOGRAM;  Surgeon: Hugh Madura, MD;  Location: [facility] INVASIVE CV LAB;  Service: Cardiovascular;  Laterality: N/A;    Family History  Problem Relation Age of Onset   Hypertension Mother    Uterine cancer Mother    Hypertension Father    Alzheimer's disease Father    Heart disease Neg Hx     Social History Social History   Tobacco Use   Smoking status: Never   Smokeless tobacco: Never  Vaping Use   Vaping status: Never Used  Substance Use Topics   Alcohol use: Yes    Comment: occasionally   Drug use: No    Current Outpatient Medications  Medication Sig Dispense Refill   amLODipine  (NORVASC ) 10 MG tablet Take 1 tablet (10 mg total) by mouth daily. 90 tablet 3   APPLE CIDER VINEGAR PO Take 800 mg by mouth daily. 400 mg each     aspirin  (ASPIRIN  LOW DOSE) 81 MG EC tablet TAKE 1 TABLET (81 MG TOTAL) BY MOUTH DAILY. SWALLOW WHOLE. 30 tablet 0   chlorthalidone  (HYGROTON ) 25 MG tablet Take 1 tablet (25 mg total) by mouth daily. 90 tablet 3   Cinnamon 500  MG capsule Take 500 mg by mouth daily.     colchicine  0.6 MG tablet TAKE 1 TABLET (0.6MG  TOTAL) BY MOUTH DAILY AS NEEDED FOR GOUT 90 tablet 1   cyanocobalamin (VITAMIN B12) 1000 MCG tablet Take 1,000 mcg by mouth daily.     ezetimibe  (ZETIA ) 10 MG tablet Take 1 tablet (10 mg total) by mouth daily. 90 tablet 3   irbesartan  (AVAPRO ) 150 MG tablet Take 150 mg by mouth See admin instructions. Take with 300 mg for a total of 450 mg daily     irbesartan  (AVAPRO ) 300 MG tablet Take 1 tablet (300 mg total) by mouth daily. (Patient taking differently: Take 300 mg by mouth See admin instructions. Take with 150 mg for a total of 450 mg daily) 90 tablet 3    metoprolol  succinate (TOPROL -XL) 25 MG 24 hr tablet Take 0.5 tablets (12.5 mg total) by mouth daily. (Patient taking differently: Take 25 mg by mouth daily.) 45 tablet 3   rosuvastatin  (CRESTOR ) 20 MG tablet Take 1 tablet (20 mg total) by mouth daily. 90 tablet 3   tetrahydrozoline 0.05 % ophthalmic solution Place 1 drop into both eyes daily as needed (Dry eyes).     Current Facility-Administered Medications  Medication Dose Route Frequency Provider Last Rate Last Admin   sodium chloride  flush (NS) 0.9 % injection 3 mL  3 mL Intravenous Q12H Chandrasekhar, Mahesh A, MD        Allergies  Allergen Reactions   Lipitor  [Atorvastatin ] Swelling    Causes lip to swell    Review of Systems  Constitutional:  Positive for fatigue. Negative for activity change.  HENT: Negative.  Negative for dental problem.   Eyes: Negative.   Respiratory:  Negative for shortness of breath.   Cardiovascular:  Negative for chest pain, palpitations and leg swelling.  Gastrointestinal: Negative.   Endocrine: Negative.   Genitourinary: Negative.   Skin: Negative.   Allergic/Immunologic: Negative.   Neurological:  Negative for dizziness and syncope.  Hematological: Negative.   Psychiatric/Behavioral: Negative.      BP (!) 169/87   Pulse 75   Resp 18   Ht 5\' 7"  (1.702 m)   Wt 226 lb (102.5 kg)   SpO2 98% Comment: RA  BMI 35.40 kg/m  Physical Exam Constitutional:      Appearance: Normal appearance. He is normal weight.  HENT:     Mouth/Throat:     Mouth: Mucous membranes are moist.     Pharynx: Oropharynx is clear.  Eyes:     Extraocular Movements: Extraocular movements intact.     Conjunctiva/sclera: Conjunctivae normal.     Pupils: Pupils are equal, round, and reactive to light.  Cardiovascular:     Rate and Rhythm: Normal rate and regular rhythm.     Pulses: Normal pulses.     Heart sounds: Murmur heard.     Comments: 3/6 diastolic murmur along the left lower sternal border. Pulmonary:      Effort: Pulmonary effort is normal.     Breath sounds: Normal breath sounds.  Abdominal:     General: There is no distension.     Tenderness: There is no abdominal tenderness.  Musculoskeletal:        General: No swelling. Normal range of motion.     Cervical back: Normal range of motion and neck supple.  Skin:    General: Skin is warm and dry.  Neurological:     General: No focal deficit present.     Mental Status:  He is alert and oriented to person, place, and time.  Psychiatric:        Mood and Affect: Mood normal.        Behavior: Behavior normal.     Diagnostic Tests:  Physicians  Panel Physicians Referring Physician Case Authorizing Physician  Arnoldo Lapping, MD (Primary)     Procedures  RIGHT/LEFT HEART CATH AND CORONARY ANGIOGRAPHY   Conclusion  1.  Severe stenosis of the nondominant RCA 2.  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  Normal intracardiac hemodynamics with mean PA pressure 22, wedge pressure of 8, LVEDP of 12, RA pressure of 5, preserved cardiac output of 7.5 with index of 3.5, and no evidence of wide pulse pressure with an aortic pressure 133/76   Surgeon Notes    11/30/2022 12:10 PM CV Procedure signed by Hugh Madura, MD   Indications  Nonrheumatic aortic valve insufficiency [I35.1 (ICD-10-CM)]   Procedural Details  Technical Details INDICATION: Patient with severe aortic insufficiency, referred for cardiac catheterization as part of a preop evaluation.  PROCEDURAL DETAILS: Using vascular ultrasound guidance, the right brachial vein is accessed with a micropuncture technique and a 4/5 French sheath is inserted.  The right wrist was then prepped, draped, and anesthetized with 1% lidocaine . Using vascular ultrasound guidance a 5/6 French Slender sheath was placed in the right radial artery. Intra-arterial verapamil  was administered through the radial artery sheath. IV  heparin  was administered after a JR4 catheter was advanced into the central aorta. A Swan-Ganz catheter was used for the right heart catheterization. Standard protocol was followed for recording of right heart pressures and sampling of oxygen saturations. Fick cardiac output was calculated. Standard Judkins catheters were used for selective coronary angiography.  For the right coronary artery, an AL-1 catheter was used.  LV pressure is recorded and an aortic valve pullback is performed. There were no immediate procedural complications. The patient was transferred to the post catheterization recovery area for further monitoring.      Estimated blood loss <50 mL.   During this procedure medications were administered to achieve and maintain moderate conscious sedation while the patient's heart rate, blood pressure, and oxygen saturation were continuously monitored and I was present face-to-face 100% of this time.   Medications (Filter: Administrations occurring from 1414 to 1510 on 11/30/22)  important  Continuous medications are totaled by the amount administered until 11/30/22 1510.   Heparin  (Porcine) in NaCl 1000-0.9 UT/500ML-% SOLN (mL)  Total volume: 1,000 mL Date/Time Rate/Dose/Volume Action   11/30/22 1421 500 mL Given   1422 500 mL Given   midazolam  (VERSED ) injection (mg)  Total dose: 2 mg Date/Time Rate/Dose/Volume Action   11/30/22 1425 2 mg Given   fentaNYL  (SUBLIMAZE ) injection (mcg)  Total dose: 25 mcg Date/Time Rate/Dose/Volume Action   11/30/22 1425 25 mcg Given   lidocaine  (PF) (XYLOCAINE ) 1 % injection (mL)  Total volume: 4 mL Date/Time Rate/Dose/Volume Action   11/30/22 1437 2 mL Given   1441 2 mL Given   Radial Cocktail/Verapamil  only (mL)  Total volume: 10 mL Date/Time Rate/Dose/Volume Action   11/30/22 1443 10 mL Given   heparin  sodium (porcine) injection (Units)  Total dose: 5,000 Units Date/Time Rate/Dose/Volume Action   11/30/22 1453 5,000 Units Given    iohexol  (OMNIPAQUE ) 350 MG/ML injection (mL)  Total volume: 45 mL Date/Time Rate/Dose/Volume Action   11/30/22 1503 45 mL Given    Sedation Time  Sedation Time  Physician-1: 36 minutes 52 seconds Contrast     Administrations occurring from 1414 to 1510 on 11/30/22:  Medication Name Total Dose  iohexol  (OMNIPAQUE ) 350 MG/ML injection 45 mL   Radiation/Fluoro  Fluoro time: 4.4 (min) DAP: 19189 (mGycm2) Cumulative Air Kerma: 243 (mGy) Complications  Complications documented before study signed (12/01/2022  7:00 AM)   No complications were associated with this study.  Documented by Maudie Sorrow, RT - 11/30/2022  3:04 PM     Coronary Findings  Diagnostic Dominance: Left Left Main  Dist LM to Ost LAD lesion is 30% stenosed.    Left Anterior Descending  Mid LAD lesion is 40% stenosed.    Ramus Intermedius  There is mild diffuse disease throughout the vessel.    Left Circumflex  There is mild diffuse disease throughout the vessel.    Second Obtuse Marginal Branch  Tortuous but patent vessel, no obstructive disease.    Right Coronary Artery  There is mild diffuse disease throughout the vessel. Nondominant but moderate caliber RCA, tortuous vessel, tight focal 80% stenosis in the midportion not amenable to PCI. The RCA supplies an acute marginal and a conus branch.  Prox RCA lesion is 80% stenosed.    Intervention   No interventions have been documented.   Coronary Diagrams  Diagnostic Dominance: Left  Intervention   Implants   No implant documentation for this case.   Syngo Images   Show images for CARDIAC CATHETERIZATION Images on Long Term Storage   Show images for Slade, Roepke to Procedure Log  Procedure Log    Hemo Data  Flowsheet Row Most Recent Value  Fick Cardiac Output 7.55 L/min  Fick Cardiac Output Index 3.53 (L/min)/BSA  RA A Wave 9 mmHg  RA V Wave 6 mmHg  RA Mean 5 mmHg  RV Systolic Pressure 32 mmHg  RV Diastolic  Pressure 3 mmHg  RV EDP 11 mmHg  PA Systolic Pressure 26 mmHg  PA Diastolic Pressure 14 mmHg  PA Mean 22 mmHg  PW A Wave 10 mmHg  PW V Wave 8 mmHg  PW Mean 8 mmHg  AO Systolic Pressure 133 mmHg  AO Diastolic Pressure 76 mmHg  AO Mean 102 mmHg  LV Systolic Pressure 133 mmHg  LV Diastolic Pressure 5 mmHg  LV EDP 12 mmHg  Arterial Occlusion Pressure Extended Systolic Pressure 134 mmHg  Arterial Occlusion Pressure Extended Diastolic Pressure 76 mmHg  Arterial Occlusion Pressure Extended Mean Pressure 101 mmHg  Left Ventricular Apex Extended Systolic Pressure 130 mmHg  LVp Diastolic Pressure 5 mmHg  Left Ventricular Apex Extended EDP Pressure 12 mmHg  QP/QS 1  TPVR Index 6.24 HRUI  TSVR Index 28.94 HRUI  PVR SVR Ratio 0.14  TPVR/TSVR Ratio 0.22    ECHOCARDIOGRAM REPORT       Patient Name:   Carlos Hayes Date of Exam: 08/11/2023  Medical Rec #:  [id number]         Height:       67.0 in  Accession #:    [id number]        Weight:       226.6 lb  Date of Birth:  1961/07/01         BSA:          2.132 m  Patient Age:    62 years          BP:           136/60 mmHg  Patient Gender: M  HR:           70 bpm.  Exam Location:  [address]   Procedure: 2D Echo, Cardiac Doppler, Color Doppler and Intracardiac             Opacification Agent   Indications:    I35.1 Aortic Valve Insufficiency    History:        Patient has prior history of Echocardiogram examinations,  most                 recent 11/10/2022. TIA; Risk Factors:Dyslipidemia and                  Hypertension.    Sonographer:    Brigid Canada RDCS  Referring Phys: [id number] Leala Prince   IMPRESSIONS     1. Left ventricular ejection fraction, by estimation, is 60 to 65%. The  left ventricle has normal function. The left ventricle has no regional  wall motion abnormalities. There is mild concentric left ventricular  hypertrophy. Left ventricular diastolic  parameters are consistent with  Grade I diastolic dysfunction (impaired  relaxation).   2. Right ventricular systolic function is normal. The right ventricular  size is normal.   3. The mitral valve is normal in structure. No evidence of mitral valve  regurgitation. No evidence of mitral stenosis.   4. Severe eccentric AI with holodiastolic flow reversal in the descending  aorta. The aortic valve is tricuspid. There is mild calcification of the  aortic valve. Aortic valve regurgitation is severe. No aortic stenosis is  present.   5. Aortic dilatation noted. There is borderline dilatation of the  ascending aorta, measuring 38 mm.   6. The inferior vena cava is normal in size with greater than 50%  respiratory variability, suggesting right atrial pressure of 3 mmHg.   Conclusion(s)/Recommendation(s): Severe eccentric AI with holodiastolic  flow reversal in the descending aorta.   FINDINGS   Left Ventricle: Left ventricular ejection fraction, by estimation, is 60  to 65%. The left ventricle has normal function. The left ventricle has no  regional wall motion abnormalities. Definity  contrast agent was given IV  to delineate the left ventricular   endocardial borders. The left ventricular internal cavity size was normal  in size. There is mild concentric left ventricular hypertrophy. Left  ventricular diastolic parameters are consistent with Grade I diastolic  dysfunction (impaired relaxation).   Right Ventricle: The right ventricular size is normal. No increase in  right ventricular wall thickness. Right ventricular systolic function is  normal.   Left Atrium: Left atrial size was normal in size.   Right Atrium: Right atrial size was normal in size.   Pericardium: There is no evidence of pericardial effusion.   Mitral Valve: The mitral valve is normal in structure. No evidence of  mitral valve regurgitation. No evidence of mitral valve stenosis.   Tricuspid Valve: The tricuspid valve is normal in structure.  Tricuspid  valve regurgitation is trivial. No evidence of tricuspid stenosis.   Aortic Valve: Severe eccentric AI with holodiastolic flow reversal in the  descending aorta. The aortic valve is tricuspid. There is mild  calcification of the aortic valve. Aortic valve regurgitation is severe.  No aortic stenosis is present.   Pulmonic Valve: The pulmonic valve was normal in structure. Pulmonic valve  regurgitation is mild. No evidence of pulmonic stenosis.   Aorta: Aortic dilatation noted. There is borderline dilatation of the  ascending aorta, measuring 38 mm.   Venous: The inferior vena cava  is normal in size with greater than 50%  respiratory variability, suggesting right atrial pressure of 3 mmHg.   IAS/Shunts: No atrial level shunt detected by color flow Doppler.     LEFT VENTRICLE  PLAX 2D  LVIDd:         5.00 cm   Diastology  LVIDs:         3.30 cm   LV e' medial:    6.04 cm/s  LV PW:         1.10 cm   LV E/e' medial:  7.9  LV IVS:        1.10 cm   LV e' lateral:   6.86 cm/s  LVOT diam:     2.30 cm   LV E/e' lateral: 6.9  LV SV:         91  LV SV Index:   43  LVOT Area:     4.15 cm     RIGHT VENTRICLE  RV Basal diam:  4.20 cm  RV S prime:     14.95 cm/s  TAPSE (M-mode): 2.1 cm   LEFT ATRIUM             Index        RIGHT ATRIUM           Index  LA diam:        4.00 cm 1.88 cm/m   RA Area:     13.60 cm  LA Vol (A2C):   33.5 ml 15.71 ml/m  RA Volume:   34.60 ml  16.23 ml/m  LA Vol (A4C):   41.4 ml 19.41 ml/m  LA Biplane Vol: 38.3 ml 17.96 ml/m   AORTIC VALVE  LVOT Vmax:   104.83 cm/s  LVOT Vmean:  67.600 cm/s  LVOT VTI:    0.218 m    AORTA  Ao Root diam: 3.50 cm  Ao Asc diam:  3.80 cm   MITRAL VALVE  MV Area (PHT): 4.23 cm    SHUNTS  MV Decel Time: 180 msec    Systemic VTI:  0.22 m  MV E velocity: 47.60 cm/s  Systemic Diam: 2.30 cm  MV A velocity: 58.10 cm/s  MV E/A ratio:  0.82   Jules Oar MD  Electronically signed by Jules Oar MD   Signature Date/Time: 08/11/2023/6:28:25 PM        Final      Impression:  This 63 year old gentleman has stage D, severe, symptomatic aortic insufficiency with NYHA class II symptoms of exertional fatigue consistent with chronic diastolic congestive heart failure.  I agree that aortic valve replacement is indicated for relief of his symptoms and to prevent progressive left ventricular dysfunction.  I recommended open surgical aortic valve replacement using a bioprosthetic valve. I discussed the operative procedure with the patient and his brother including alternatives, benefits and risks; including but not limited to bleeding, blood transfusion, infection, stroke, myocardial infarction, graft failure, heart block requiring a permanent pacemaker, organ dysfunction, and death.  Carlos Hayes understands and agrees to proceed.    Plan:  He will call us  to schedule aortic valve replacement using a bioprosthetic valve.  I spent 60 minutes performing this consultation and > 50% of this time was spent face to face counseling and coordinating the care of this patient's severe symptomatic aortic insufficiency.   Bartley Lightning, MD [facility] [phone]

## 2023-09-11 ENCOUNTER — Other Ambulatory Visit: Payer: Self-pay | Admitting: Internal Medicine

## 2023-09-14 ENCOUNTER — Encounter: Payer: Self-pay | Admitting: *Deleted

## 2023-09-14 ENCOUNTER — Other Ambulatory Visit: Payer: Self-pay | Admitting: *Deleted

## 2023-09-14 DIAGNOSIS — I351 Nonrheumatic aortic (valve) insufficiency: Secondary | ICD-10-CM

## 2023-09-15 ENCOUNTER — Other Ambulatory Visit: Payer: Self-pay | Admitting: Internal Medicine

## 2023-09-15 DIAGNOSIS — R7303 Prediabetes: Secondary | ICD-10-CM

## 2023-09-15 DIAGNOSIS — E785 Hyperlipidemia, unspecified: Secondary | ICD-10-CM

## 2023-09-16 ENCOUNTER — Other Ambulatory Visit: Payer: Self-pay | Admitting: Internal Medicine

## 2023-09-16 DIAGNOSIS — G459 Transient cerebral ischemic attack, unspecified: Secondary | ICD-10-CM

## 2023-09-16 DIAGNOSIS — I10 Essential (primary) hypertension: Secondary | ICD-10-CM

## 2023-09-20 NOTE — Pre-Procedure Instructions (Signed)
Surgical Instructions   Your procedure is scheduled on September 23, 2023. Report to [facility] Main Entrance "A" at 5:30 A.M., then check in with the Admitting office. Any questions or running late day of surgery: call [phone]  Questions prior to your surgery date: call [phone], Monday-Friday, 8am-4pm. If you experience any cold or flu symptoms such as cough, fever, chills, shortness of breath, etc. between now and your scheduled surgery, please notify us at the above number.     Remember:  Do not eat or drink after midnight the night before your surgery    Take these medicines the morning of surgery with A SIP OF WATER: amLODipine (NORVASC)  ezetimibe (ZETIA)  metoprolol succinate (TOPROL-XL)  rosuvastatin (CRESTOR)    May take these medicines IF NEEDED: tetrahydrozoline ophthalmic solution    Continue taking your Aspirin through the day before surgery. DO NOT take any the morning of surgery.   One week prior to surgery, STOP taking any Aleve, Naproxen, Ibuprofen, Motrin, Advil, Goody's, BC's, all herbal medications, fish oil, and non-prescription vitamins.                     Do NOT Smoke (Tobacco/Vaping) for 24 hours prior to your procedure.  If you use a CPAP at night, you may bring your mask/headgear for your overnight stay.   You will be asked to remove any contacts, glasses, piercing's, hearing aid's, dentures/partials prior to surgery. Please bring cases for these items if needed.    Patients discharged the day of surgery will not be allowed to drive home, and someone needs to stay with them for 24 hours.  SURGICAL WAITING ROOM VISITATION Patients may have no more than 2 support people in the waiting area - these visitors may rotate.   Pre-op nurse will coordinate an appropriate time for 1 ADULT support person, who may not rotate, to accompany patient in pre-op.  Children under the age of 6 must have an adult with them who is not the patient and must  remain in the main waiting area with an adult.  If the patient needs to stay at the hospital during part of their recovery, the visitor guidelines for inpatient rooms apply.  Please refer to the [facility] website for the visitor guidelines for any additional information.   If you received a COVID test during your pre-op visit  it is requested that you wear a mask when out in public, stay away from anyone that may not be feeling well and notify your surgeon if you develop symptoms. If you have been in contact with anyone that has tested positive in the last 10 days please notify you surgeon.      Pre-operative CHG Bathing Instructions   You can play a key role in reducing the risk of infection after surgery. Your skin needs to be as free of germs as possible. You can reduce the number of germs on your skin by washing with CHG (chlorhexidine gluconate) soap before surgery. CHG is an antiseptic soap that kills germs and continues to kill germs even after washing.   DO NOT use if you have an allergy to chlorhexidine/CHG or antibacterial soaps. If your skin becomes reddened or irritated, stop using the CHG and notify one of our RNs at [phone].              TAKE A SHOWER THE NIGHT BEFORE SURGERY AND THE DAY OF SURGERY    Please keep in mind the following:  DO NOT shave, including legs and underarms, 48 hours prior to surgery.   You may shave your face before/day of surgery.  Place clean sheets on your bed the night before surgery Use a clean washcloth (not used since being washed) for each shower. DO NOT sleep with pet's night before surgery.  CHG Shower Instructions:  Wash your face and private area with normal soap. If you choose to wash your hair, wash first with your normal shampoo.  After you use shampoo/soap, rinse your hair and body thoroughly to remove shampoo/soap residue.  Turn the water OFF and apply half the bottle of CHG soap to a CLEAN washcloth.  Apply CHG soap ONLY  FROM YOUR NECK DOWN TO YOUR TOES (washing for 3-5 minutes)  DO NOT use CHG soap on face, private areas, open wounds, or sores.  Pay special attention to the area where your surgery is being performed.  If you are having back surgery, having someone wash your back for you may be helpful. Wait 2 minutes after CHG soap is applied, then you may rinse off the CHG soap.  Pat dry with a clean towel  Put on clean pajamas    Additional instructions for the day of surgery: DO NOT APPLY any lotions, deodorants, cologne, or perfumes.   Do not wear jewelry or makeup Do not wear nail polish, gel polish, artificial nails, or any other type of covering on natural nails (fingers and toes) Do not bring valuables to the hospital. [facility] is not responsible for valuables/personal belongings. Put on clean/comfortable clothes.  Please brush your teeth.  Ask your nurse before applying any prescription medications to the skin.

## 2023-09-21 ENCOUNTER — Other Ambulatory Visit: Payer: Self-pay

## 2023-09-21 ENCOUNTER — Ambulatory Visit (HOSPITAL_COMMUNITY)
Admission: RE | Admit: 2023-09-21 | Discharge: 2023-09-21 | Disposition: A | Discharge status: Home / Self Care | Payer: Medicare Other | Source: Ambulatory Visit | Attending: Surgery | Admitting: Surgery

## 2023-09-21 ENCOUNTER — Encounter (HOSPITAL_COMMUNITY): Payer: Self-pay

## 2023-09-21 ENCOUNTER — Encounter (HOSPITAL_COMMUNITY)
Admission: RE | Admit: 2023-09-21 | Discharge: 2023-09-21 | Disposition: A | Discharge status: Home / Self Care | Payer: 59 | Source: Ambulatory Visit | Attending: Surgery | Admitting: Surgery

## 2023-09-21 VITALS — BP 141/70 | HR 66 | Temp 98.0°F | Resp 18 | Ht 67.0 in | Wt 229.8 lb

## 2023-09-21 DIAGNOSIS — I2510 Atherosclerotic heart disease of native coronary artery without angina pectoris: Secondary | ICD-10-CM | POA: Diagnosis not present

## 2023-09-21 DIAGNOSIS — I10 Essential (primary) hypertension: Secondary | ICD-10-CM | POA: Insufficient documentation

## 2023-09-21 DIAGNOSIS — R9431 Abnormal electrocardiogram [ECG] [EKG]: Secondary | ICD-10-CM | POA: Diagnosis not present

## 2023-09-21 DIAGNOSIS — E785 Hyperlipidemia, unspecified: Secondary | ICD-10-CM | POA: Diagnosis not present

## 2023-09-21 DIAGNOSIS — Z01812 Encounter for preprocedural laboratory examination: Secondary | ICD-10-CM | POA: Diagnosis present

## 2023-09-21 DIAGNOSIS — Z01818 Encounter for other preprocedural examination: Secondary | ICD-10-CM

## 2023-09-21 DIAGNOSIS — Z01810 Encounter for preprocedural cardiovascular examination: Secondary | ICD-10-CM | POA: Diagnosis present

## 2023-09-21 DIAGNOSIS — Z01811 Encounter for preprocedural respiratory examination: Secondary | ICD-10-CM | POA: Diagnosis present

## 2023-09-21 DIAGNOSIS — Z79899 Other long term (current) drug therapy: Secondary | ICD-10-CM | POA: Insufficient documentation

## 2023-09-21 DIAGNOSIS — I351 Nonrheumatic aortic (valve) insufficiency: Secondary | ICD-10-CM

## 2023-09-21 HISTORY — DX: Unspecified osteoarthritis, unspecified site: M19.90

## 2023-09-21 HISTORY — DX: Umbilical hernia without obstruction or gangrene: K42.9

## 2023-09-21 HISTORY — DX: Cardiac murmur, unspecified: R01.1

## 2023-09-21 HISTORY — DX: Cerebral infarction, unspecified: I63.9

## 2023-09-21 LAB — COMPREHENSIVE METABOLIC PANEL
ALT: 33 U/L (ref 0–44)
AST: 25 U/L (ref 15–41)
Albumin: 3.9 g/dL (ref 3.5–5.0)
Alkaline Phosphatase: 55 U/L (ref 38–126)
Anion gap: 9 (ref 5–15)
BUN: 24 mg/dL — ABNORMAL HIGH (ref 6–20)
CO2: 26 mmol/L (ref 22–32)
Calcium: 9.6 mg/dL (ref 8.9–10.3)
Chloride: 102 mmol/L (ref 98–111)
Creatinine, Ser: 1.78 mg/dL — ABNORMAL HIGH (ref 0.61–1.24)
GFR, Estimated: 43 mL/min — ABNORMAL LOW (ref >60)
Glucose, Bld: 96 mg/dL (ref 70–99)
Potassium: 3.9 mmol/L (ref 3.5–5.1)
Sodium: 137 mmol/L (ref 135–145)
Total Bilirubin: 0.7 mg/dL (ref 0.0–1.2)
Total Protein: 8.7 g/dL — ABNORMAL HIGH (ref 6.5–8.1)

## 2023-09-21 LAB — PROTIME-INR
INR: 1 (ref 0.8–1.2)
Prothrombin Time: 13.8 s (ref 11.4–15.2)

## 2023-09-21 LAB — URINALYSIS, ROUTINE W REFLEX MICROSCOPIC
Bilirubin Urine: NEGATIVE
Glucose, UA: NEGATIVE mg/dL
Hgb urine dipstick: NEGATIVE
Ketones, ur: NEGATIVE mg/dL
Leukocytes,Ua: NEGATIVE
Nitrite: NEGATIVE
Protein, ur: NEGATIVE mg/dL
Specific Gravity, Urine: 1.009 (ref 1.005–1.030)
pH: 7 (ref 5.0–8.0)

## 2023-09-21 LAB — SURGICAL PCR SCREEN
MRSA, PCR: NEGATIVE
Staphylococcus aureus: NEGATIVE

## 2023-09-21 LAB — TYPE AND SCREEN
ABO/RH(D): AB POS
Antibody Screen: NEGATIVE

## 2023-09-21 LAB — HEMOGLOBIN A1C
Hgb A1c MFr Bld: 6 % — ABNORMAL HIGH (ref 4.8–5.6)
Mean Plasma Glucose: 125.5 mg/dL

## 2023-09-21 LAB — CBC
HCT: 42.5 % (ref 39.0–52.0)
Hemoglobin: 13.3 g/dL (ref 13.0–17.0)
MCH: 25.8 pg — ABNORMAL LOW (ref 26.0–34.0)
MCHC: 31.3 g/dL (ref 30.0–36.0)
MCV: 82.5 fL (ref 80.0–100.0)
Platelets: 280 10*3/uL (ref 150–400)
RBC: 5.15 MIL/uL (ref 4.22–5.81)
RDW: 13.4 % (ref 11.5–15.5)
WBC: 6.3 10*3/uL (ref 4.0–10.5)
nRBC: 0 % (ref 0.0–0.2)

## 2023-09-21 LAB — APTT: aPTT: 27 s (ref 24–36)

## 2023-09-21 NOTE — Progress Notes (Signed)
Pt arrived and completed his Vascular appointment but did not arrive for his PAT appointment. Multiple attempts to call pts phone number, but phone went straight to voicemail box which was full. Khayri Kargbo, pts brother, called and he was also not able to get in touch with him. Brother is going to go by his house to see if he is there. RN noted that we will still be able to see him today and to please call back when he is able to find/speak with patient.   Darius Bump, RN with [facility] also made aware of pts no show and will continue to update her.

## 2023-09-21 NOTE — Progress Notes (Addendum)
PCP - Angus Seller, NP Cardiologist - Dr. Noralee Space - Last office visit 11/15/2022  PPM/ICD - Denies Device Orders - n/a Rep Notified - n/a  Chest x-ray - 09/21/2023 EKG - 09/21/2023 Stress Test - Denies ECHO - 08/11/2023 Cardiac Cath - 11/30/2022  Sleep Study - Denies - STOPBANG score 7. Pts PCP made aware CPAP - n/a  No DM  Last dose of GLP1 agonist- n/a GLP1 instructions: n/a  Blood Thinner Instructions: n/a Aspirin Instructions: Pt instructed to continue taking ASA through the day before surgery and NONE morning of surgery  NPO after midnight  COVID TEST- n/a   Anesthesia review: Yes. Abnormal EKG review. Hx of HTN, TIA and CKD. High sleep apnea score  Patient denies shortness of breath, fever, cough and chest pain at PAT appointment. Pt denies any respiratory illness/infection in the last two months.   All instructions explained to the patient, with a verbal understanding of the material. Patient agrees to go over the instructions while at home for a better understanding. Patient also instructed to self quarantine after being tested for COVID-19. The opportunity to ask questions was provided.

## 2023-09-22 MED ORDER — PHENYLEPHRINE HCL-NACL 20-0.9 MG/250ML-% IV SOLN
30.0000 ug/min | INTRAVENOUS | Status: DC
  Filled 2023-09-22: qty 250

## 2023-09-22 MED ORDER — DEXMEDETOMIDINE HCL IN NACL 400 MCG/100ML IV SOLN
0.1000 ug/kg/h | INTRAVENOUS | Status: DC
  Filled 2023-09-22: qty 100

## 2023-09-22 MED ORDER — TRANEXAMIC ACID (OHS) PUMP PRIME SOLUTION
2.0000 mg/kg | INTRAVENOUS | Status: DC
  Filled 2023-09-22: qty 2.08

## 2023-09-22 MED ORDER — TRANEXAMIC ACID 1000 MG/10ML IV SOLN
1.5000 mg/kg/h | INTRAVENOUS | Status: AC
  Administered 2023-09-23: 1.5 mg/kg/h via INTRAVENOUS
  Filled 2023-09-22: qty 25

## 2023-09-22 MED ORDER — TRANEXAMIC ACID (OHS) BOLUS VIA INFUSION
15.0000 mg/kg | INTRAVENOUS | Status: AC
  Administered 2023-09-23: 1563 mg via INTRAVENOUS
  Filled 2023-09-22: qty 1563

## 2023-09-22 MED ORDER — MANNITOL 20 % IV SOLN
INTRAVENOUS | Status: DC
  Filled 2023-09-22: qty 13

## 2023-09-22 MED ORDER — PAPAVERINE HCL 30 MG/ML IJ SOLN
INTRAMUSCULAR | Status: DC
  Filled 2023-09-22: qty 2.5

## 2023-09-22 MED ORDER — CEFAZOLIN SODIUM-DEXTROSE 2-4 GM/100ML-% IV SOLN
2.0000 g | INTRAVENOUS | Status: DC
  Filled 2023-09-22: qty 100

## 2023-09-22 MED ORDER — POTASSIUM CHLORIDE 2 MEQ/ML IV SOLN
80.0000 meq | INTRAVENOUS | Status: DC
  Filled 2023-09-22: qty 40

## 2023-09-22 MED ORDER — MILRINONE LACTATE IN DEXTROSE 20-5 MG/100ML-% IV SOLN
0.3000 ug/kg/min | INTRAVENOUS | Status: DC
  Filled 2023-09-22: qty 100

## 2023-09-22 MED ORDER — CEFAZOLIN SODIUM-DEXTROSE 2-4 GM/100ML-% IV SOLN
2.0000 g | INTRAVENOUS | Status: AC
  Administered 2023-09-23 (×2): 2 g via INTRAVENOUS
  Filled 2023-09-22: qty 100

## 2023-09-22 MED ORDER — NITROGLYCERIN IN D5W 200-5 MCG/ML-% IV SOLN
2.0000 ug/min | INTRAVENOUS | Status: DC
  Filled 2023-09-22: qty 250

## 2023-09-22 MED ORDER — VANCOMYCIN HCL 1.5 G IV SOLR
1500.0000 mg | INTRAVENOUS | Status: AC
  Administered 2023-09-23: 1500 mg via INTRAVENOUS
  Filled 2023-09-22: qty 30

## 2023-09-22 MED ORDER — HEPARIN 30,000 UNITS/1000 ML (OHS) CELLSAVER SOLUTION
Status: DC
  Filled 2023-09-22: qty 1000

## 2023-09-22 MED ORDER — NOREPINEPHRINE 4 MG/250ML-% IV SOLN
0.0000 ug/min | INTRAVENOUS | Status: DC
  Filled 2023-09-22: qty 250

## 2023-09-22 MED ORDER — INSULIN REGULAR(HUMAN) IN NACL 100-0.9 UT/100ML-% IV SOLN
INTRAVENOUS | Status: DC
  Filled 2023-09-22: qty 100

## 2023-09-22 MED ORDER — EPINEPHRINE HCL 5 MG/250ML IV SOLN IN NS
0.0000 ug/min | INTRAVENOUS | Status: DC
  Filled 2023-09-22: qty 250

## 2023-09-22 NOTE — H&P (Signed)
[address]       [place] [address]             [phone]      Cardiothoracic Surgery Admission History and Physical   PCP is Ivonne Andrew, NP Referring Provider is Perlie Gold, PA-C       Chief Complaint  Patient presents with severe aortic insufficiency and single-vessel coronary disease              HPI:   The patient is a 63 year old gentleman with a history of hypertension, hyperlipidemia, stage III chronic kidney disease, previous TIA, and aortic insufficiency that has been followed by cardiology.  He has been lost to follow-up for periods of time.  He had a TEE on 11/30/2022 showing a trileaflet aortic valve with severe regurgitation.  There was an eccentric jet with a pressure half-time of 129 ms.  Left ventricular ejection fraction was 65 to 70% with no LV dilation.  He underwent cardiac catheterization on the same day showing a 80% stenosis of a small nondominant right coronary artery.  There was no significant obstructive coronary disease in any of the other vessels.  Left and right heart pressures were within normal limits with an LVEDP of 12 and a cardiac index of 3.5.  The pulse pressure was not wide at 133/76.  His most recent echo on 08/11/2023 continue to show severe eccentric AI with holodiastolic flow reversal in the descending aorta.  Left ventricular systolic function was normal with a diastolic diameter of 5 cm.  Aortic root diameter was measured at 3.5 cm with an ascending aortic diameter 3.8 cm.   The patient lives by himself.  He reports development of some fatigue and tiredness with activity.  He denies any shortness of breath or orthopnea.  He has had no peripheral edema.  He denies any chest pain or pressure.  Has had no dizziness or syncope.  He is unemployed and not physically very active.       Past Medical History:  Diagnosis Date   Aortic insufficiency     Chronic kidney disease, stage 3 (HCC)     Gout     Hyperlipidemia      Hypertension     TIA (transient ischemic attack)                 Past Surgical History:  Procedure Laterality Date   RIGHT/LEFT HEART CATH AND CORONARY ANGIOGRAPHY N/A 11/30/2022    Procedure: RIGHT/LEFT HEART CATH AND CORONARY ANGIOGRAPHY;  Surgeon: Tonny Bollman, MD;  Location: [facility] INVASIVE CV LAB;  Service: Cardiovascular;  Laterality: N/A;   TEE WITHOUT CARDIOVERSION N/A 11/30/2022    Procedure: TRANSESOPHAGEAL ECHOCARDIOGRAM;  Surgeon: Jake Bathe, MD;  Location: [facility] INVASIVE CV LAB;  Service: Cardiovascular;  Laterality: N/A;               Family History  Problem Relation Age of Onset   Hypertension Mother     Uterine cancer Mother     Hypertension Father     Alzheimer's disease Father     Heart disease Neg Hx            Social History Social History  Social History         Tobacco Use   Smoking status: Never   Smokeless tobacco: Never  Vaping Use   Vaping status: Never Used  Substance Use Topics   Alcohol use: Yes      Comment:  occasionally   Drug use: No              Current Outpatient Medications  Medication Sig Dispense Refill   amLODipine (NORVASC) 10 MG tablet Take 1 tablet (10 mg total) by mouth daily. 90 tablet 3   APPLE CIDER VINEGAR PO Take 800 mg by mouth daily. 400 mg each       aspirin (ASPIRIN LOW DOSE) 81 MG EC tablet TAKE 1 TABLET (81 MG TOTAL) BY MOUTH DAILY. SWALLOW WHOLE. 30 tablet 0   chlorthalidone (HYGROTON) 25 MG tablet Take 1 tablet (25 mg total) by mouth daily. 90 tablet 3   Cinnamon 500 MG capsule Take 500 mg by mouth daily.       colchicine 0.6 MG tablet TAKE 1 TABLET (0.6MG  TOTAL) BY MOUTH DAILY AS NEEDED FOR GOUT 90 tablet 1   cyanocobalamin (VITAMIN B12) 1000 MCG tablet Take 1,000 mcg by mouth daily.       ezetimibe (ZETIA) 10 MG tablet Take 1 tablet (10 mg total) by mouth daily. 90 tablet 3   irbesartan (AVAPRO) 150 MG tablet Take 150 mg by mouth See admin instructions. Take with 300 mg for a total of 450 mg daily        irbesartan (AVAPRO) 300 MG tablet Take 1 tablet (300 mg total) by mouth daily. (Patient taking differently: Take 300 mg by mouth See admin instructions. Take with 150 mg for a total of 450 mg daily) 90 tablet 3   metoprolol succinate (TOPROL-XL) 25 MG 24 hr tablet Take 0.5 tablets (12.5 mg total) by mouth daily. (Patient taking differently: Take 25 mg by mouth daily.) 45 tablet 3   rosuvastatin (CRESTOR) 20 MG tablet Take 1 tablet (20 mg total) by mouth daily. 90 tablet 3   tetrahydrozoline 0.05 % ophthalmic solution Place 1 drop into both eyes daily as needed (Dry eyes).                   Current Facility-Administered Medications  Medication Dose Route Frequency Provider Last Rate Last Admin   sodium chloride flush (NS) 0.9 % injection 3 mL  3 mL Intravenous Q12H Chandrasekhar, Mahesh A, MD            Allergies       Allergies  Allergen Reactions   Lipitor [Atorvastatin] Swelling      Causes lip to swell        Review of Systems  Constitutional:  Positive for fatigue. Negative for activity change.  HENT: Negative.  Negative for dental problem.   Eyes: Negative.   Respiratory:  Negative for shortness of breath.   Cardiovascular:  Negative for chest pain, palpitations and leg swelling.  Gastrointestinal: Negative.   Endocrine: Negative.   Genitourinary: Negative.   Skin: Negative.   Allergic/Immunologic: Negative.   Neurological:  Negative for dizziness and syncope.  Hematological: Negative.   Psychiatric/Behavioral: Negative.        BP (!) 169/87   Pulse 75   Resp 18   Ht 5\' 7"  (1.702 m)   Wt 226 lb (102.5 kg)   SpO2 98% Comment: RA  BMI 35.40 kg/m  Physical Exam Constitutional:      Appearance: Normal appearance. He is normal weight.  HENT:     Mouth/Throat:     Mouth: Mucous membranes are moist.     Pharynx: Oropharynx is clear.  Eyes:     Extraocular Movements: Extraocular movements intact.     Conjunctiva/sclera: Conjunctivae normal.  Pupils: Pupils  are equal, round, and reactive to light.  Cardiovascular:     Rate and Rhythm: Normal rate and regular rhythm.     Pulses: Normal pulses.     Heart sounds: Murmur heard.     Comments: 3/6 diastolic murmur along the left lower sternal border. Pulmonary:     Effort: Pulmonary effort is normal.     Breath sounds: Normal breath sounds.  Abdominal:     General: There is no distension.     Tenderness: There is no abdominal tenderness.  Musculoskeletal:        General: No swelling. Normal range of motion.     Cervical back: Normal range of motion and neck supple.  Skin:    General: Skin is warm and dry.  Neurological:     General: No focal deficit present.     Mental Status: He is alert and oriented to person, place, and time.  Psychiatric:        Mood and Affect: Mood normal.        Behavior: Behavior normal.        Diagnostic Tests:   Physicians   Panel Physicians Referring Physician Case Authorizing Physician  Tonny Bollman, MD (Primary)        Procedures   RIGHT/LEFT HEART CATH AND CORONARY ANGIOGRAPHY    Conclusion   1.  Severe stenosis of the nondominant RCA 2.  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  Normal intracardiac hemodynamics with mean PA pressure 22, wedge pressure of 8, LVEDP of 12, RA pressure of 5, preserved cardiac output of 7.5 with index of 3.5, and no evidence of wide pulse pressure with an aortic pressure 133/76   Surgeon Notes       11/30/2022 12:10 PM CV Procedure signed by Jake Bathe, MD    Indications   Nonrheumatic aortic valve insufficiency [I35.1 (ICD-10-CM)]    Procedural Details   Technical Details INDICATION: Patient with severe aortic insufficiency, referred for cardiac catheterization as part of a preop evaluation.  PROCEDURAL DETAILS: Using vascular ultrasound guidance, the right brachial vein is accessed with a micropuncture technique and a 4/5 French  sheath is inserted.  The right wrist was then prepped, draped, and anesthetized with 1% lidocaine. Using vascular ultrasound guidance a 5/6 French Slender sheath was placed in the right radial artery. Intra-arterial verapamil was administered through the radial artery sheath. IV heparin was administered after a JR4 catheter was advanced into the central aorta. A Swan-Ganz catheter was used for the right heart catheterization. Standard protocol was followed for recording of right heart pressures and sampling of oxygen saturations. Fick cardiac output was calculated. Standard Judkins catheters were used for selective coronary angiography.  For the right coronary artery, an AL-1 catheter was used.  LV pressure is recorded and an aortic valve pullback is performed. There were no immediate procedural complications. The patient was transferred to the post catheterization recovery area for further monitoring.      Estimated blood loss <50 mL.   During this procedure medications were administered to achieve and maintain moderate conscious sedation while the patient's heart rate, blood pressure, and oxygen saturation were continuously monitored and I was present face-to-face 100% of this time.    Medications (Filter: Administrations occurring from 1414 to 1510 on 11/30/22)  important  Continuous medications are totaled by the amount administered until 11/30/22 1510.    Heparin (Porcine) in NaCl 1000-0.9 UT/500ML-%  SOLN (mL)  Total volume: 1,000 mL Date/Time Rate/Dose/Volume Action    11/30/22 1421 500 mL Given    1422 500 mL Given    midazolam (VERSED) injection (mg)  Total dose: 2 mg Date/Time Rate/Dose/Volume Action    11/30/22 1425 2 mg Given    fentaNYL (SUBLIMAZE) injection (mcg)  Total dose: 25 mcg Date/Time Rate/Dose/Volume Action    11/30/22 1425 25 mcg Given    lidocaine (PF) (XYLOCAINE) 1 % injection (mL)  Total volume: 4 mL Date/Time Rate/Dose/Volume Action    11/30/22 1437 2 mL Given     1441 2 mL Given    Radial Cocktail/Verapamil only (mL)  Total volume: 10 mL Date/Time Rate/Dose/Volume Action    11/30/22 1443 10 mL Given    heparin sodium (porcine) injection (Units)  Total dose: 5,000 Units Date/Time Rate/Dose/Volume Action    11/30/22 1453 5,000 Units Given    iohexol (OMNIPAQUE) 350 MG/ML injection (mL)  Total volume: 45 mL Date/Time Rate/Dose/Volume Action    11/30/22 1503 45 mL Given      Sedation Time   Sedation Time Physician-1: 36 minutes 52 seconds Contrast        Administrations occurring from 1414 to 1510 on 11/30/22:  Medication Name Total Dose  iohexol (OMNIPAQUE) 350 MG/ML injection 45 mL    Radiation/Fluoro   Fluoro time: 4.4 (min) DAP: 19189 (mGycm2) Cumulative Air Kerma: 243 (mGy) Complications   Complications documented before study signed (12/01/2022  7:00 AM)    No complications were associated with this study.  Documented by Lucius Conn, RT - 11/30/2022  3:04 PM      Coronary Findings   Diagnostic Dominance: Left Left Main  Dist LM to Ost LAD lesion is 30% stenosed.    Left Anterior Descending  Mid LAD lesion is 40% stenosed.    Ramus Intermedius  There is mild diffuse disease throughout the vessel.    Left Circumflex  There is mild diffuse disease throughout the vessel.    Second Obtuse Marginal Branch  Tortuous but patent vessel, no obstructive disease.    Right Coronary Artery  There is mild diffuse disease throughout the vessel. Nondominant but moderate caliber RCA, tortuous vessel, tight focal 80% stenosis in the midportion not amenable to PCI. The RCA supplies an acute marginal and a conus branch.  Prox RCA lesion is 80% stenosed.    Intervention    No interventions have been documented.    Coronary Diagrams   Diagnostic Dominance: Left  Intervention    Implants    No implant documentation for this case.    Syngo Images    Show images for CARDIAC CATHETERIZATION Images on Long Term  Storage    Show images for Darrall, Strey to Procedure Log   Procedure Log    Hemo Data   Flowsheet Row Most Recent Value  Fick Cardiac Output 7.55 L/min  Fick Cardiac Output Index 3.53 (L/min)/BSA  RA A Wave 9 mmHg  RA V Wave 6 mmHg  RA Mean 5 mmHg  RV Systolic Pressure 32 mmHg  RV Diastolic Pressure 3 mmHg  RV EDP 11 mmHg  PA Systolic Pressure 26 mmHg  PA Diastolic Pressure 14 mmHg  PA Mean 22 mmHg  PW A Wave 10 mmHg  PW V Wave 8 mmHg  PW Mean 8 mmHg  AO Systolic Pressure 133 mmHg  AO Diastolic Pressure 76 mmHg  AO Mean 102 mmHg  LV Systolic Pressure 133 mmHg  LV Diastolic Pressure 5 mmHg  LV  EDP 12 mmHg  Arterial Occlusion Pressure Extended Systolic Pressure 134 mmHg  Arterial Occlusion Pressure Extended Diastolic Pressure 76 mmHg  Arterial Occlusion Pressure Extended Mean Pressure 101 mmHg  Left Ventricular Apex Extended Systolic Pressure 130 mmHg  LVp Diastolic Pressure 5 mmHg  Left Ventricular Apex Extended EDP Pressure 12 mmHg  QP/QS 1  TPVR Index 6.24 HRUI  TSVR Index 28.94 HRUI  PVR SVR Ratio 0.14  TPVR/TSVR Ratio 0.22      ECHOCARDIOGRAM REPORT       Patient Name:   BOWEN KIA Date of Exam: 08/11/2023  Medical Rec #:  [id number]         Height:       67.0 in  Accession #:    [id number]        Weight:       226.6 lb  Date of Birth:  1961-03-02         BSA:          2.132 m  Patient Age:    62 years          BP:           136/60 mmHg  Patient Gender: M                 HR:           70 bpm.  Exam Location:  [address]   Procedure: 2D Echo, Cardiac Doppler, Color Doppler and Intracardiac             Opacification Agent   Indications:    I35.1 Aortic Valve Insufficiency    History:        Patient has prior history of Echocardiogram examinations,  most                 recent 11/10/2022. TIA; Risk Factors:Dyslipidemia and                  Hypertension.    Sonographer:    Daphine Deutscher RDCS  Referring Phys: [id number] Perlie Gold   IMPRESSIONS     1. Left ventricular ejection fraction, by estimation, is 60 to 65%. The  left ventricle has normal function. The left ventricle has no regional  wall motion abnormalities. There is mild concentric left ventricular  hypertrophy. Left ventricular diastolic  parameters are consistent with Grade I diastolic dysfunction (impaired  relaxation).   2. Right ventricular systolic function is normal. The right ventricular  size is normal.   3. The mitral valve is normal in structure. No evidence of mitral valve  regurgitation. No evidence of mitral stenosis.   4. Severe eccentric AI with holodiastolic flow reversal in the descending  aorta. The aortic valve is tricuspid. There is mild calcification of the  aortic valve. Aortic valve regurgitation is severe. No aortic stenosis is  present.   5. Aortic dilatation noted. There is borderline dilatation of the  ascending aorta, measuring 38 mm.   6. The inferior vena cava is normal in size with greater than 50%  respiratory variability, suggesting right atrial pressure of 3 mmHg.   Conclusion(s)/Recommendation(s): Severe eccentric AI with holodiastolic  flow reversal in the descending aorta.   FINDINGS   Left Ventricle: Left ventricular ejection fraction, by estimation, is 60  to 65%. The left ventricle has normal function. The left ventricle has no  regional wall motion abnormalities. Definity contrast agent was given IV  to delineate the left ventricular   endocardial borders. The left ventricular internal  cavity size was normal  in size. There is mild concentric left ventricular hypertrophy. Left  ventricular diastolic parameters are consistent with Grade I diastolic  dysfunction (impaired relaxation).   Right Ventricle: The right ventricular size is normal. No increase in  right ventricular wall thickness. Right ventricular systolic function is  normal.   Left Atrium: Left atrial size was normal in size.    Right Atrium: Right atrial size was normal in size.   Pericardium: There is no evidence of pericardial effusion.   Mitral Valve: The mitral valve is normal in structure. No evidence of  mitral valve regurgitation. No evidence of mitral valve stenosis.   Tricuspid Valve: The tricuspid valve is normal in structure. Tricuspid  valve regurgitation is trivial. No evidence of tricuspid stenosis.   Aortic Valve: Severe eccentric AI with holodiastolic flow reversal in the  descending aorta. The aortic valve is tricuspid. There is mild  calcification of the aortic valve. Aortic valve regurgitation is severe.  No aortic stenosis is present.   Pulmonic Valve: The pulmonic valve was normal in structure. Pulmonic valve  regurgitation is mild. No evidence of pulmonic stenosis.   Aorta: Aortic dilatation noted. There is borderline dilatation of the  ascending aorta, measuring 38 mm.   Venous: The inferior vena cava is normal in size with greater than 50%  respiratory variability, suggesting right atrial pressure of 3 mmHg.   IAS/Shunts: No atrial level shunt detected by color flow Doppler.     LEFT VENTRICLE  PLAX 2D  LVIDd:         5.00 cm   Diastology  LVIDs:         3.30 cm   LV e' medial:    6.04 cm/s  LV PW:         1.10 cm   LV E/e' medial:  7.9  LV IVS:        1.10 cm   LV e' lateral:   6.86 cm/s  LVOT diam:     2.30 cm   LV E/e' lateral: 6.9  LV SV:         91  LV SV Index:   43  LVOT Area:     4.15 cm     RIGHT VENTRICLE  RV Basal diam:  4.20 cm  RV S prime:     14.95 cm/s  TAPSE (M-mode): 2.1 cm   LEFT ATRIUM             Index        RIGHT ATRIUM           Index  LA diam:        4.00 cm 1.88 cm/m   RA Area:     13.60 cm  LA Vol (A2C):   33.5 ml 15.71 ml/m  RA Volume:   34.60 ml  16.23 ml/m  LA Vol (A4C):   41.4 ml 19.41 ml/m  LA Biplane Vol: 38.3 ml 17.96 ml/m   AORTIC VALVE  LVOT Vmax:   104.83 cm/s  LVOT Vmean:  67.600 cm/s  LVOT VTI:    0.218 m    AORTA   Ao Root diam: 3.50 cm  Ao Asc diam:  3.80 cm   MITRAL VALVE  MV Area (PHT): 4.23 cm    SHUNTS  MV Decel Time: 180 msec    Systemic VTI:  0.22 m  MV E velocity: 47.60 cm/s  Systemic Diam: 2.30 cm  MV A velocity: 58.10 cm/s  MV E/A ratio:  0.82   Arvilla Meres MD  Electronically signed by Arvilla Meres MD  Signature Date/Time: 08/11/2023/6:28:25 PM        Final        Impression:   This 63 year old gentleman has stage D, severe, symptomatic aortic insufficiency with NYHA class II symptoms of exertional fatigue consistent with chronic diastolic congestive heart failure.  I agree that aortic valve replacement is indicated for relief of his symptoms and to prevent progressive left ventricular dysfunction.  I recommended open surgical aortic valve replacement using a bioprosthetic valve. I discussed the operative procedure with the patient and his brother including alternatives, benefits and risks; including but not limited to bleeding, blood transfusion, infection, stroke, myocardial infarction, graft failure, heart block requiring a permanent pacemaker, organ dysfunction, and death.  Pleas Patricia understands and agrees to proceed.     Plan:   Aortic valve replacement using a bioprosthetic valve.      Alleen Borne, MD [facility] [phone]

## 2023-09-22 NOTE — Anesthesia Preprocedure Evaluation (Addendum)
Anesthesia Evaluation  Patient identified by MRN, date of birth, ID band Patient awake    Reviewed: Allergy & Precautions, NPO status , Patient's Chart, lab work & pertinent test results  Airway Mallampati: II  TM Distance: >3 FB Neck ROM: Full    Dental no notable dental hx. (+) Dental Advisory Given, Teeth Intact   Pulmonary neg pulmonary ROS   Pulmonary exam normal breath sounds clear to auscultation       Cardiovascular hypertension, Pt. on medications + CAD  + Valvular Problems/Murmurs  Rhythm:Regular Rate:Normal + Diastolic murmurs Echo 08/2023  1. Left ventricular ejection fraction, by estimation, is 60 to 65%. The left ventricle has normal function. The left ventricle has no regional wall motion abnormalities. There is mild concentric left ventricular hypertrophy. Left ventricular diastolic parameters are consistent with Grade I diastolic dysfunction (impaired relaxation).   2. Right ventricular systolic function is normal. The right ventricular size is normal.   3. The mitral valve is normal in structure. No evidence of mitral valve regurgitation. No evidence of mitral stenosis.   4. 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 The aortic valve is tricuspid. There is mild calcification of the aortic valve. Aortic valve regurgitation is severe. No aortic stenosis is present.   5. Aortic dilatation noted. There is borderline dilatation of the ascending aorta, measuring 38 mm.   6. The inferior vena cava is normal in size with greater than 50% respiratory variability, suggesting right atrial pressure of 3 mmHg.   Conclusion(s)/Recommendation(s): Severe eccentric AI with holodiastolic  flow reversal in the descending aorta.   TEE 11/2022  1. Severe aortic regurgitation.   2. Left ventricular ejection fraction, by estimation, is 65 to 70%. The left ventricle has normal function.   3. Right  ventricular systolic function is normal. The right ventricular size is normal.   4. No left atrial/left atrial appendage thrombus was detected.   5. The mitral valve is normal in structure. Trivial mitral valve regurgitation.   6. Eccentric jet . The aortic valve is tricuspid. Aortic valve regurgitation is severe. Aortic regurgitation PHT measures 129 msec.     Cath 11/2022 1.  Severe stenosis of the nondominant RCA 2.  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  Normal intracardiac hemodynamics with mean PA pressure 22, wedge pressure of 8, LVEDP of 12, RA pressure of 5, preserved cardiac output of 7.5 with index of 3.5, and no evidence of wide pulse pressure with an aortic pressure 133/76    Neuro/Psych TIACVA    GI/Hepatic negative GI ROS, Neg liver ROS,,,  Endo/Other  negative endocrine ROS    Renal/GU Renal disease     Musculoskeletal  (+) Arthritis ,    Abdominal  (+) + obese  Peds  Hematology negative hematology ROS (+)   Anesthesia Other Findings   Reproductive/Obstetrics                             Anesthesia Physical Anesthesia Plan  ASA: 4  Anesthesia Plan: General   Post-op Pain Management:    Induction: Intravenous  PONV Risk Score and Plan: 3 and Treatment may vary due to age or medical condition and Midazolam  Airway Management Planned: Oral ETT  Additional Equipment: Arterial line, CVP, PA Cath, TEE and Ultrasound Guidance Line Placement  Intra-op Plan:   Post-operative Plan: Post-operative intubation/ventilation  Informed  Consent: I have reviewed the patients History and Physical, chart, labs and discussed the procedure including the risks, benefits and alternatives for the proposed anesthesia with the patient or authorized representative who has indicated his/her understanding and acceptance.     Dental advisory given  Plan Discussed with:  CRNA  Anesthesia Plan Comments:        Anesthesia Quick Evaluation

## 2023-09-23 ENCOUNTER — Encounter (HOSPITAL_COMMUNITY)
Admission: RE | Disposition: A | Discharge status: Home / Self Care | Payer: Self-pay | Source: Home / Self Care | Attending: Surgery

## 2023-09-23 ENCOUNTER — Other Ambulatory Visit: Payer: Self-pay

## 2023-09-23 ENCOUNTER — Inpatient Hospital Stay (HOSPITAL_COMMUNITY): Payer: 59 | Admitting: Anesthesiology

## 2023-09-23 ENCOUNTER — Inpatient Hospital Stay (HOSPITAL_COMMUNITY)
Admission: RE | Admit: 2023-09-23 | Discharge: 2023-09-28 | DRG: 220 | Disposition: A | Discharge status: Home / Self Care | Payer: 59 | Attending: Surgery | Admitting: Surgery

## 2023-09-23 ENCOUNTER — Inpatient Hospital Stay (HOSPITAL_COMMUNITY): Payer: 59

## 2023-09-23 ENCOUNTER — Encounter (HOSPITAL_COMMUNITY): Payer: Self-pay | Admitting: Surgery

## 2023-09-23 ENCOUNTER — Telehealth: Payer: Self-pay | Admitting: Cardiology

## 2023-09-23 DIAGNOSIS — I2510 Atherosclerotic heart disease of native coronary artery without angina pectoris: Secondary | ICD-10-CM | POA: Diagnosis present

## 2023-09-23 DIAGNOSIS — D62 Acute posthemorrhagic anemia: Secondary | ICD-10-CM | POA: Diagnosis not present

## 2023-09-23 DIAGNOSIS — R000 Tachycardia, unspecified: Secondary | ICD-10-CM | POA: Diagnosis not present

## 2023-09-23 DIAGNOSIS — N1832 Chronic kidney disease, stage 3b: Secondary | ICD-10-CM | POA: Diagnosis present

## 2023-09-23 DIAGNOSIS — Z8049 Family history of malignant neoplasm of other genital organs: Secondary | ICD-10-CM | POA: Diagnosis not present

## 2023-09-23 DIAGNOSIS — I319 Disease of pericardium, unspecified: Secondary | ICD-10-CM | POA: Diagnosis present

## 2023-09-23 DIAGNOSIS — I351 Nonrheumatic aortic (valve) insufficiency: Secondary | ICD-10-CM | POA: Diagnosis present

## 2023-09-23 DIAGNOSIS — E669 Obesity, unspecified: Secondary | ICD-10-CM | POA: Diagnosis present

## 2023-09-23 DIAGNOSIS — Z953 Presence of xenogenic heart valve: Principal | ICD-10-CM

## 2023-09-23 DIAGNOSIS — I10 Essential (primary) hypertension: Secondary | ICD-10-CM | POA: Diagnosis not present

## 2023-09-23 DIAGNOSIS — Z7982 Long term (current) use of aspirin: Secondary | ICD-10-CM | POA: Diagnosis not present

## 2023-09-23 DIAGNOSIS — Z6836 Body mass index (BMI) 36.0-36.9, adult: Secondary | ICD-10-CM | POA: Diagnosis not present

## 2023-09-23 DIAGNOSIS — I639 Cerebral infarction, unspecified: Secondary | ICD-10-CM | POA: Diagnosis not present

## 2023-09-23 DIAGNOSIS — Z8249 Family history of ischemic heart disease and other diseases of the circulatory system: Secondary | ICD-10-CM | POA: Diagnosis not present

## 2023-09-23 DIAGNOSIS — I350 Nonrheumatic aortic (valve) stenosis: Secondary | ICD-10-CM | POA: Diagnosis not present

## 2023-09-23 DIAGNOSIS — Z820 Family history of epilepsy and other diseases of the nervous system: Secondary | ICD-10-CM | POA: Diagnosis not present

## 2023-09-23 DIAGNOSIS — Z560 Unemployment, unspecified: Secondary | ICD-10-CM | POA: Diagnosis not present

## 2023-09-23 DIAGNOSIS — E785 Hyperlipidemia, unspecified: Secondary | ICD-10-CM | POA: Diagnosis present

## 2023-09-23 DIAGNOSIS — Z8673 Personal history of transient ischemic attack (TIA), and cerebral infarction without residual deficits: Secondary | ICD-10-CM | POA: Diagnosis not present

## 2023-09-23 DIAGNOSIS — J9811 Atelectasis: Secondary | ICD-10-CM | POA: Diagnosis not present

## 2023-09-23 DIAGNOSIS — R7303 Prediabetes: Secondary | ICD-10-CM | POA: Diagnosis present

## 2023-09-23 DIAGNOSIS — Z9889 Other specified postprocedural states: Secondary | ICD-10-CM

## 2023-09-23 DIAGNOSIS — I130 Hypertensive heart and chronic kidney disease with heart failure and stage 1 through stage 4 chronic kidney disease, or unspecified chronic kidney disease: Secondary | ICD-10-CM | POA: Diagnosis present

## 2023-09-23 DIAGNOSIS — I5032 Chronic diastolic (congestive) heart failure: Secondary | ICD-10-CM | POA: Diagnosis present

## 2023-09-23 DIAGNOSIS — N179 Acute kidney failure, unspecified: Secondary | ICD-10-CM | POA: Diagnosis not present

## 2023-09-23 HISTORY — PX: AORTIC VALVE REPLACEMENT: SHX41

## 2023-09-23 HISTORY — PX: TEE WITHOUT CARDIOVERSION: SHX5443

## 2023-09-23 LAB — POCT I-STAT 7, (LYTES, BLD GAS, ICA,H+H)
Acid-Base Excess: 0 mmol/L (ref 0.0–2.0)
Acid-Base Excess: 1 mmol/L (ref 0.0–2.0)
Acid-Base Excess: 0 mmol/L (ref 0.0–2.0)
Acid-base deficit: 1 mmol/L (ref 0.0–2.0)
Acid-base deficit: 2 mmol/L (ref 0.0–2.0)
Acid-base deficit: 2 mmol/L (ref 0.0–2.0)
Acid-base deficit: 2 mmol/L (ref 0.0–2.0)
Bicarbonate: 25.5 mmol/L (ref 20.0–28.0)
Bicarbonate: 25.3 mmol/L (ref 20.0–28.0)
Bicarbonate: 25.2 mmol/L (ref 20.0–28.0)
Bicarbonate: 23.1 mmol/L (ref 20.0–28.0)
Bicarbonate: 22.8 mmol/L (ref 20.0–28.0)
Bicarbonate: 23.4 mmol/L (ref 20.0–28.0)
Bicarbonate: 22.6 mmol/L (ref 20.0–28.0)
Calcium, Ion: 1.18 mmol/L (ref 1.15–1.40)
Calcium, Ion: 1.02 mmol/L — ABNORMAL LOW (ref 1.15–1.40)
Calcium, Ion: 1.09 mmol/L — ABNORMAL LOW (ref 1.15–1.40)
Calcium, Ion: 1.07 mmol/L — ABNORMAL LOW (ref 1.15–1.40)
Calcium, Ion: 1.09 mmol/L — ABNORMAL LOW (ref 1.15–1.40)
Calcium, Ion: 1.15 mmol/L (ref 1.15–1.40)
Calcium, Ion: 1.16 mmol/L (ref 1.15–1.40)
HCT: 36 % — ABNORMAL LOW (ref 39.0–52.0)
HCT: 27 % — ABNORMAL LOW (ref 39.0–52.0)
HCT: 28 % — ABNORMAL LOW (ref 39.0–52.0)
HCT: 27 % — ABNORMAL LOW (ref 39.0–52.0)
HCT: 31 % — ABNORMAL LOW (ref 39.0–52.0)
HCT: 34 % — ABNORMAL LOW (ref 39.0–52.0)
HCT: 34 % — ABNORMAL LOW (ref 39.0–52.0)
Hemoglobin: 12.2 g/dL — ABNORMAL LOW (ref 13.0–17.0)
Hemoglobin: 9.2 g/dL — ABNORMAL LOW (ref 13.0–17.0)
Hemoglobin: 9.5 g/dL — ABNORMAL LOW (ref 13.0–17.0)
Hemoglobin: 9.2 g/dL — ABNORMAL LOW (ref 13.0–17.0)
Hemoglobin: 10.5 g/dL — ABNORMAL LOW (ref 13.0–17.0)
Hemoglobin: 11.6 g/dL — ABNORMAL LOW (ref 13.0–17.0)
Hemoglobin: 11.6 g/dL — ABNORMAL LOW (ref 13.0–17.0)
O2 Saturation: 100 %
O2 Saturation: 100 %
O2 Saturation: 100 %
O2 Saturation: 100 %
O2 Saturation: 98 %
O2 Saturation: 98 %
O2 Saturation: 97 %
Patient temperature: 36.2
Patient temperature: 36.8
Patient temperature: 36.8
Potassium: 3.7 mmol/L (ref 3.5–5.1)
Potassium: 3.9 mmol/L (ref 3.5–5.1)
Potassium: 4.5 mmol/L (ref 3.5–5.1)
Potassium: 4.1 mmol/L (ref 3.5–5.1)
Potassium: 4 mmol/L (ref 3.5–5.1)
Potassium: 3.9 mmol/L (ref 3.5–5.1)
Potassium: 3.8 mmol/L (ref 3.5–5.1)
Sodium: 140 mmol/L (ref 135–145)
Sodium: 139 mmol/L (ref 135–145)
Sodium: 135 mmol/L (ref 135–145)
Sodium: 137 mmol/L (ref 135–145)
Sodium: 138 mmol/L (ref 135–145)
Sodium: 137 mmol/L (ref 135–145)
Sodium: 138 mmol/L (ref 135–145)
TCO2: 27 mmol/L (ref 22–32)
TCO2: 27 mmol/L (ref 22–32)
TCO2: 26 mmol/L (ref 22–32)
TCO2: 24 mmol/L (ref 22–32)
TCO2: 24 mmol/L (ref 22–32)
TCO2: 25 mmol/L (ref 22–32)
TCO2: 24 mmol/L (ref 22–32)
pCO2 arterial: 42.9 mm[Hg] (ref 32–48)
pCO2 arterial: 39.8 mm[Hg] (ref 32–48)
pCO2 arterial: 41.6 mm[Hg] (ref 32–48)
pCO2 arterial: 35.1 mm[Hg] (ref 32–48)
pCO2 arterial: 37.1 mm[Hg] (ref 32–48)
pCO2 arterial: 39.6 mm[Hg] (ref 32–48)
pCO2 arterial: 37.6 mm[Hg] (ref 32–48)
pH, Arterial: 7.382 (ref 7.35–7.45)
pH, Arterial: 7.412 (ref 7.35–7.45)
pH, Arterial: 7.39 (ref 7.35–7.45)
pH, Arterial: 7.427 (ref 7.35–7.45)
pH, Arterial: 7.393 (ref 7.35–7.45)
pH, Arterial: 7.379 (ref 7.35–7.45)
pH, Arterial: 7.387 (ref 7.35–7.45)
pO2, Arterial: 401 mm[Hg] — ABNORMAL HIGH (ref 83–108)
pO2, Arterial: 429 mm[Hg] — ABNORMAL HIGH (ref 83–108)
pO2, Arterial: 370 mm[Hg] — ABNORMAL HIGH (ref 83–108)
pO2, Arterial: 403 mm[Hg] — ABNORMAL HIGH (ref 83–108)
pO2, Arterial: 94 mm[Hg] (ref 83–108)
pO2, Arterial: 100 mm[Hg] (ref 83–108)
pO2, Arterial: 92 mm[Hg] (ref 83–108)

## 2023-09-23 LAB — POCT I-STAT EG7
Acid-Base Excess: 0 mmol/L (ref 0.0–2.0)
Bicarbonate: 24.9 mmol/L (ref 20.0–28.0)
Calcium, Ion: 1.05 mmol/L — ABNORMAL LOW (ref 1.15–1.40)
HCT: 28 % — ABNORMAL LOW (ref 39.0–52.0)
Hemoglobin: 9.5 g/dL — ABNORMAL LOW (ref 13.0–17.0)
O2 Saturation: 77 %
Potassium: 3.7 mmol/L (ref 3.5–5.1)
Sodium: 139 mmol/L (ref 135–145)
TCO2: 26 mmol/L (ref 22–32)
pCO2, Ven: 41 mm[Hg] — ABNORMAL LOW (ref 44–60)
pH, Ven: 7.391 (ref 7.25–7.43)
pO2, Ven: 42 mm[Hg] (ref 32–45)

## 2023-09-23 LAB — POCT I-STAT, CHEM 8
BUN: 26 mg/dL — ABNORMAL HIGH (ref 6–20)
BUN: 26 mg/dL — ABNORMAL HIGH (ref 6–20)
BUN: 24 mg/dL — ABNORMAL HIGH (ref 6–20)
BUN: 22 mg/dL — ABNORMAL HIGH (ref 6–20)
Calcium, Ion: 1.16 mmol/L (ref 1.15–1.40)
Calcium, Ion: 1.19 mmol/L (ref 1.15–1.40)
Calcium, Ion: 1.1 mmol/L — ABNORMAL LOW (ref 1.15–1.40)
Calcium, Ion: 1.06 mmol/L — ABNORMAL LOW (ref 1.15–1.40)
Chloride: 102 mmol/L (ref 98–111)
Chloride: 101 mmol/L (ref 98–111)
Chloride: 101 mmol/L (ref 98–111)
Chloride: 100 mmol/L (ref 98–111)
Creatinine, Ser: 2 mg/dL — ABNORMAL HIGH (ref 0.61–1.24)
Creatinine, Ser: 1.9 mg/dL — ABNORMAL HIGH (ref 0.61–1.24)
Creatinine, Ser: 1.8 mg/dL — ABNORMAL HIGH (ref 0.61–1.24)
Creatinine, Ser: 1.6 mg/dL — ABNORMAL HIGH (ref 0.61–1.24)
Glucose, Bld: 115 mg/dL — ABNORMAL HIGH (ref 70–99)
Glucose, Bld: 127 mg/dL — ABNORMAL HIGH (ref 70–99)
Glucose, Bld: 127 mg/dL — ABNORMAL HIGH (ref 70–99)
Glucose, Bld: 124 mg/dL — ABNORMAL HIGH (ref 70–99)
HCT: 34 % — ABNORMAL LOW (ref 39.0–52.0)
HCT: 34 % — ABNORMAL LOW (ref 39.0–52.0)
HCT: 29 % — ABNORMAL LOW (ref 39.0–52.0)
HCT: 28 % — ABNORMAL LOW (ref 39.0–52.0)
Hemoglobin: 11.6 g/dL — ABNORMAL LOW (ref 13.0–17.0)
Hemoglobin: 11.6 g/dL — ABNORMAL LOW (ref 13.0–17.0)
Hemoglobin: 9.9 g/dL — ABNORMAL LOW (ref 13.0–17.0)
Hemoglobin: 9.5 g/dL — ABNORMAL LOW (ref 13.0–17.0)
Potassium: 3.7 mmol/L (ref 3.5–5.1)
Potassium: 3.7 mmol/L (ref 3.5–5.1)
Potassium: 4.6 mmol/L (ref 3.5–5.1)
Potassium: 4.1 mmol/L (ref 3.5–5.1)
Sodium: 139 mmol/L (ref 135–145)
Sodium: 138 mmol/L (ref 135–145)
Sodium: 137 mmol/L (ref 135–145)
Sodium: 136 mmol/L (ref 135–145)
TCO2: 26 mmol/L (ref 22–32)
TCO2: 27 mmol/L (ref 22–32)
TCO2: 27 mmol/L (ref 22–32)
TCO2: 24 mmol/L (ref 22–32)

## 2023-09-23 LAB — CBC
HCT: 32.2 % — ABNORMAL LOW (ref 39.0–52.0)
HCT: 34.5 % — ABNORMAL LOW (ref 39.0–52.0)
Hemoglobin: 10.1 g/dL — ABNORMAL LOW (ref 13.0–17.0)
Hemoglobin: 11 g/dL — ABNORMAL LOW (ref 13.0–17.0)
MCH: 26 pg (ref 26.0–34.0)
MCH: 26.4 pg (ref 26.0–34.0)
MCHC: 31.4 g/dL (ref 30.0–36.0)
MCHC: 31.9 g/dL (ref 30.0–36.0)
MCV: 83 fL (ref 80.0–100.0)
MCV: 82.7 fL (ref 80.0–100.0)
Platelets: 166 10*3/uL (ref 150–400)
Platelets: 206 10*3/uL (ref 150–400)
RBC: 3.88 MIL/uL — ABNORMAL LOW (ref 4.22–5.81)
RBC: 4.17 MIL/uL — ABNORMAL LOW (ref 4.22–5.81)
RDW: 13.7 % (ref 11.5–15.5)
RDW: 13.4 % (ref 11.5–15.5)
WBC: 10 10*3/uL (ref 4.0–10.5)
WBC: 12.9 10*3/uL — ABNORMAL HIGH (ref 4.0–10.5)
nRBC: 0 % (ref 0.0–0.2)
nRBC: 0 % (ref 0.0–0.2)

## 2023-09-23 LAB — MAGNESIUM: Magnesium: 3.5 mg/dL — ABNORMAL HIGH (ref 1.7–2.4)

## 2023-09-23 LAB — ECHO INTRAOPERATIVE TEE
AV Mean grad: 4 mm[Hg]
AV Peak grad: 7.8 mm[Hg]
Ao pk vel: 1.4 m/s
Height: 67 in
P 1/2 time: 223 ms
Weight: 3664 [oz_av]

## 2023-09-23 LAB — HEMOGLOBIN AND HEMATOCRIT, BLOOD
HCT: 28.5 % — ABNORMAL LOW (ref 39.0–52.0)
Hemoglobin: 9.1 g/dL — ABNORMAL LOW (ref 13.0–17.0)

## 2023-09-23 LAB — GLUCOSE, CAPILLARY
Glucose-Capillary: 125 mg/dL — ABNORMAL HIGH (ref 70–99)
Glucose-Capillary: 138 mg/dL — ABNORMAL HIGH (ref 70–99)
Glucose-Capillary: 136 mg/dL — ABNORMAL HIGH (ref 70–99)
Glucose-Capillary: 153 mg/dL — ABNORMAL HIGH (ref 70–99)
Glucose-Capillary: 179 mg/dL — ABNORMAL HIGH (ref 70–99)
Glucose-Capillary: 141 mg/dL — ABNORMAL HIGH (ref 70–99)
Glucose-Capillary: 134 mg/dL — ABNORMAL HIGH (ref 70–99)

## 2023-09-23 LAB — BASIC METABOLIC PANEL
Anion gap: 12 (ref 5–15)
BUN: 21 mg/dL — ABNORMAL HIGH (ref 6–20)
CO2: 21 mmol/L — ABNORMAL LOW (ref 22–32)
Calcium: 8.1 mg/dL — ABNORMAL LOW (ref 8.9–10.3)
Chloride: 102 mmol/L (ref 98–111)
Creatinine, Ser: 1.83 mg/dL — ABNORMAL HIGH (ref 0.61–1.24)
GFR, Estimated: 42 mL/min — ABNORMAL LOW (ref >60)
Glucose, Bld: 145 mg/dL — ABNORMAL HIGH (ref 70–99)
Potassium: 3.8 mmol/L (ref 3.5–5.1)
Sodium: 135 mmol/L (ref 135–145)

## 2023-09-23 LAB — PLATELET COUNT: Platelets: 168 10*3/uL (ref 150–400)

## 2023-09-23 LAB — PROTIME-INR
INR: 1.5 — ABNORMAL HIGH (ref 0.8–1.2)
Prothrombin Time: 18 s — ABNORMAL HIGH (ref 11.4–15.2)

## 2023-09-23 LAB — ABO/RH: ABO/RH(D): AB POS

## 2023-09-23 LAB — APTT: aPTT: 35 s (ref 24–36)

## 2023-09-23 SURGERY — REPLACEMENT, AORTIC VALVE, OPEN
Anesthesia: General | Site: Chest

## 2023-09-23 MED ORDER — ONDANSETRON HCL 4 MG/2ML IJ SOLN
4.0000 mg | Freq: Four times a day (QID) | INTRAMUSCULAR | Status: DC | PRN
  Filled 2023-09-23: qty 2

## 2023-09-23 MED ORDER — FENTANYL CITRATE (PF) 250 MCG/5ML IJ SOLN
INTRAMUSCULAR | Status: AC
  Filled 2023-09-23: qty 5

## 2023-09-23 MED ORDER — ACETAMINOPHEN 500 MG PO TABS
1000.0000 mg | ORAL_TABLET | Freq: Four times a day (QID) | ORAL | Status: DC
  Administered 2023-09-23 – 2023-09-28 (×17): 1000 mg via ORAL
  Filled 2023-09-23 (×18): qty 2

## 2023-09-23 MED ORDER — ORAL CARE MOUTH RINSE
15.0000 mL | OROMUCOSAL | Status: DC
  Administered 2023-09-23 (×5): 15 mL via OROMUCOSAL

## 2023-09-23 MED ORDER — ORAL CARE MOUTH RINSE: 15.0000 mL | OROMUCOSAL | Status: DC | PRN

## 2023-09-23 MED ORDER — SODIUM CHLORIDE 0.9 % IV SOLN: INTRAVENOUS | Status: DC

## 2023-09-23 MED ORDER — PHENYLEPHRINE HCL (PRESSORS) 10 MG/ML IV SOLN
INTRAVENOUS | Status: DC | PRN
  Administered 2023-09-23 (×2): 80 ug via INTRAVENOUS

## 2023-09-23 MED ORDER — LACTATED RINGERS IV SOLN: INTRAVENOUS | Status: DC | PRN

## 2023-09-23 MED ORDER — CEFAZOLIN SODIUM-DEXTROSE 2-4 GM/100ML-% IV SOLN
2.0000 g | Freq: Three times a day (TID) | INTRAVENOUS | Status: AC
  Administered 2023-09-23 – 2023-09-25 (×6): 2 g via INTRAVENOUS
  Filled 2023-09-23 (×6): qty 100

## 2023-09-23 MED ORDER — ASPIRIN 81 MG PO CHEW
324.0000 mg | CHEWABLE_TABLET | Freq: Every day | ORAL | Status: DC
  Filled 2023-09-23: qty 4

## 2023-09-23 MED ORDER — ACETAMINOPHEN 160 MG/5ML PO SOLN
1000.0000 mg | Freq: Four times a day (QID) | ORAL | Status: DC
  Filled 2023-09-23: qty 40.6

## 2023-09-23 MED ORDER — BISACODYL 5 MG PO TBEC
10.0000 mg | DELAYED_RELEASE_TABLET | Freq: Every day | ORAL | Status: DC
  Administered 2023-09-24 – 2023-09-25 (×2): 10 mg via ORAL
  Filled 2023-09-23 (×2): qty 2

## 2023-09-23 MED ORDER — ALBUMIN HUMAN 5 % IV SOLN: 250.0000 mL | INTRAVENOUS | Status: DC | PRN

## 2023-09-23 MED ORDER — HEPARIN SODIUM (PORCINE) 1000 UNIT/ML IJ SOLN
INTRAMUSCULAR | Status: DC | PRN
  Administered 2023-09-23: 37000 [IU] via INTRAVENOUS

## 2023-09-23 MED ORDER — ASPIRIN 81 MG PO CHEW
324.0000 mg | CHEWABLE_TABLET | Freq: Once | ORAL | Status: AC
  Administered 2023-09-23: 324 mg via ORAL
  Filled 2023-09-23: qty 4

## 2023-09-23 MED ORDER — NITROGLYCERIN IN D5W 200-5 MCG/ML-% IV SOLN
0.0000 ug/min | INTRAVENOUS | Status: DC
  Administered 2023-09-23: 0 ug/min via INTRAVENOUS

## 2023-09-23 MED ORDER — ROCURONIUM BROMIDE 100 MG/10ML IV SOLN
INTRAVENOUS | Status: DC | PRN
  Administered 2023-09-23: 10 mg via INTRAVENOUS
  Administered 2023-09-23: 100 mg via INTRAVENOUS
  Administered 2023-09-23: 50 mg via INTRAVENOUS

## 2023-09-23 MED ORDER — BISACODYL 10 MG RE SUPP: 10.0000 mg | Freq: Every day | RECTAL | Status: DC

## 2023-09-23 MED ORDER — PANTOPRAZOLE SODIUM 40 MG PO TBEC
40.0000 mg | DELAYED_RELEASE_TABLET | Freq: Every day | ORAL | Status: DC
  Administered 2023-09-25 – 2023-09-28 (×4): 40 mg via ORAL
  Filled 2023-09-23 (×4): qty 1

## 2023-09-23 MED ORDER — MIDAZOLAM HCL (PF) 10 MG/2ML IJ SOLN
INTRAMUSCULAR | Status: AC
Start: 2023-09-23 — End: ?
  Filled 2023-09-23: qty 2

## 2023-09-23 MED ORDER — PROPOFOL 10 MG/ML IV BOLUS
INTRAVENOUS | Status: AC
  Filled 2023-09-23: qty 20

## 2023-09-23 MED ORDER — VANCOMYCIN HCL IN DEXTROSE 1-5 GM/200ML-% IV SOLN
1000.0000 mg | Freq: Once | INTRAVENOUS | Status: AC
  Administered 2023-09-23: 1000 mg via INTRAVENOUS
  Filled 2023-09-23: qty 200

## 2023-09-23 MED ORDER — INSULIN REGULAR(HUMAN) IN NACL 100-0.9 UT/100ML-% IV SOLN: INTRAVENOUS | Status: DC

## 2023-09-23 MED ORDER — THROMBIN (RECOMBINANT) 20000 UNITS EX SOLR
CUTANEOUS | Status: AC
Start: 2023-09-23 — End: ?
  Filled 2023-09-23: qty 20000

## 2023-09-23 MED ORDER — CHLORHEXIDINE GLUCONATE CLOTH 2 % EX PADS
6.0000 | MEDICATED_PAD | Freq: Every day | CUTANEOUS | Status: DC
  Administered 2023-09-23 – 2023-09-27 (×4): 6 via TOPICAL

## 2023-09-23 MED ORDER — MIDAZOLAM HCL (PF) 5 MG/ML IJ SOLN
INTRAMUSCULAR | Status: DC | PRN
  Administered 2023-09-23: 2 mg via INTRAVENOUS
  Administered 2023-09-23: 3 mg via INTRAVENOUS
  Administered 2023-09-23: 2 mg via INTRAVENOUS

## 2023-09-23 MED ORDER — ONDANSETRON HCL 4 MG/2ML IJ SOLN
INTRAMUSCULAR | Status: AC
  Filled 2023-09-23: qty 2

## 2023-09-23 MED ORDER — LACTATED RINGERS IV SOLN: INTRAVENOUS | Status: AC

## 2023-09-23 MED ORDER — INSULIN REGULAR(HUMAN) IN NACL 100-0.9 UT/100ML-% IV SOLN
INTRAVENOUS | Status: DC | PRN
  Administered 2023-09-23: .8 [IU]/h via INTRAVENOUS

## 2023-09-23 MED ORDER — DOCUSATE SODIUM 100 MG PO CAPS
200.0000 mg | ORAL_CAPSULE | Freq: Every day | ORAL | Status: DC
  Administered 2023-09-24 – 2023-09-28 (×5): 200 mg via ORAL
  Filled 2023-09-23 (×5): qty 2

## 2023-09-23 MED ORDER — LACTATED RINGERS IV SOLN: INTRAVENOUS | Status: DC

## 2023-09-23 MED ORDER — SODIUM CHLORIDE 0.9% FLUSH: 3.0000 mL | INTRAVENOUS | Status: DC | PRN

## 2023-09-23 MED ORDER — METOPROLOL TARTRATE 12.5 MG HALF TABLET
12.5000 mg | ORAL_TABLET | Freq: Two times a day (BID) | ORAL | Status: DC
  Administered 2023-09-24 (×2): 12.5 mg via ORAL
  Filled 2023-09-23 (×2): qty 1

## 2023-09-23 MED ORDER — ACETAMINOPHEN 160 MG/5ML PO SOLN
650.0000 mg | Freq: Once | ORAL | Status: AC
  Administered 2023-09-23: 650 mg
  Filled 2023-09-23: qty 20.3

## 2023-09-23 MED ORDER — SODIUM CHLORIDE 0.9% FLUSH
3.0000 mL | Freq: Two times a day (BID) | INTRAVENOUS | Status: DC
  Administered 2023-09-23 – 2023-09-24 (×3): 10 mL via INTRAVENOUS
  Administered 2023-09-24: 3 mL via INTRAVENOUS

## 2023-09-23 MED ORDER — OXYCODONE HCL 5 MG PO TABS
5.0000 mg | ORAL_TABLET | ORAL | Status: DC | PRN
  Administered 2023-09-23 – 2023-09-24 (×2): 5 mg via ORAL
  Administered 2023-09-24 (×2): 10 mg via ORAL
  Administered 2023-09-25 – 2023-09-28 (×2): 5 mg via ORAL
  Filled 2023-09-23: qty 2
  Filled 2023-09-23 (×3): qty 1
  Filled 2023-09-23 (×2): qty 2

## 2023-09-23 MED ORDER — METOPROLOL TARTRATE 5 MG/5ML IV SOLN
2.5000 mg | INTRAVENOUS | Status: DC | PRN
  Administered 2023-09-24: 2.5 mg via INTRAVENOUS
  Administered 2023-09-24 – 2023-09-25 (×2): 5 mg via INTRAVENOUS
  Filled 2023-09-23 (×3): qty 5

## 2023-09-23 MED ORDER — PHENYLEPHRINE HCL-NACL 20-0.9 MG/250ML-% IV SOLN: 0.0000 ug/min | INTRAVENOUS | Status: DC

## 2023-09-23 MED ORDER — ~~LOC~~ CARDIAC SURGERY, PATIENT & FAMILY EDUCATION
Freq: Once | Status: DC
  Filled 2023-09-23: qty 1

## 2023-09-23 MED ORDER — MORPHINE SULFATE (PF) 2 MG/ML IV SOLN
1.0000 mg | INTRAVENOUS | Status: DC | PRN
  Administered 2023-09-23: 4 mg via INTRAVENOUS
  Administered 2023-09-23: 2 mg via INTRAVENOUS
  Administered 2023-09-23: 1 mg via INTRAVENOUS
  Administered 2023-09-23: 4 mg via INTRAVENOUS
  Administered 2023-09-23: 1 mg via INTRAVENOUS
  Filled 2023-09-23: qty 2
  Filled 2023-09-23 (×3): qty 1
  Filled 2023-09-23: qty 2

## 2023-09-23 MED ORDER — ROSUVASTATIN CALCIUM 20 MG PO TABS
20.0000 mg | ORAL_TABLET | Freq: Every day | ORAL | Status: DC
  Administered 2023-09-24 – 2023-09-28 (×5): 20 mg via ORAL
  Filled 2023-09-23 (×5): qty 1

## 2023-09-23 MED ORDER — SODIUM CHLORIDE 0.9 % IV SOLN: INTRAVENOUS | Status: AC

## 2023-09-23 MED ORDER — CHLORHEXIDINE GLUCONATE 0.12 % MT SOLN
15.0000 mL | OROMUCOSAL | Status: AC
  Administered 2023-09-23: 15 mL via OROMUCOSAL
  Filled 2023-09-23: qty 15

## 2023-09-23 MED ORDER — MIDAZOLAM HCL 2 MG/2ML IJ SOLN: 2.0000 mg | INTRAMUSCULAR | Status: DC | PRN

## 2023-09-23 MED ORDER — PLASMA-LYTE A IV SOLN: INTRAVENOUS | Status: DC | PRN

## 2023-09-23 MED ORDER — PROPOFOL 10 MG/ML IV BOLUS
INTRAVENOUS | Status: DC | PRN
  Administered 2023-09-23: 50 mg via INTRAVENOUS

## 2023-09-23 MED ORDER — SODIUM CHLORIDE 0.9% FLUSH
3.0000 mL | Freq: Two times a day (BID) | INTRAVENOUS | Status: DC
  Administered 2023-09-24 (×2): 3 mL via INTRAVENOUS

## 2023-09-23 MED ORDER — CHLORHEXIDINE GLUCONATE 4 % EX SOLN
30.0000 mL | CUTANEOUS | Status: DC
Start: 2023-09-23 — End: 2023-09-23

## 2023-09-23 MED ORDER — METOPROLOL TARTRATE 12.5 MG HALF TABLET: 12.5000 mg | ORAL_TABLET | Freq: Once | ORAL | Status: DC

## 2023-09-23 MED ORDER — SODIUM CHLORIDE 0.9% FLUSH
3.0000 mL | Freq: Two times a day (BID) | INTRAVENOUS | Status: DC
  Administered 2023-09-23 – 2023-09-24 (×4): 10 mL via INTRAVENOUS

## 2023-09-23 MED ORDER — 0.9 % SODIUM CHLORIDE (POUR BTL) OPTIME
TOPICAL | Status: DC | PRN
  Administered 2023-09-23: 5000 mL

## 2023-09-23 MED ORDER — HYDROMORPHONE HCL 1 MG/ML IJ SOLN: 0.2500 mg | INTRAMUSCULAR | Status: DC | PRN

## 2023-09-23 MED ORDER — POTASSIUM CHLORIDE 10 MEQ/50ML IV SOLN: 10.0000 meq | INTRAVENOUS | Status: AC

## 2023-09-23 MED ORDER — SODIUM CHLORIDE 0.45 % IV SOLN: INTRAVENOUS | Status: AC

## 2023-09-23 MED ORDER — DEXMEDETOMIDINE HCL IN NACL 400 MCG/100ML IV SOLN
0.0000 ug/kg/h | INTRAVENOUS | Status: DC
  Administered 2023-09-23: 0.7 ug/kg/h via INTRAVENOUS
  Filled 2023-09-23: qty 100

## 2023-09-23 MED ORDER — CHLORHEXIDINE GLUCONATE 0.12 % MT SOLN
15.0000 mL | Freq: Once | OROMUCOSAL | Status: DC
  Filled 2023-09-23: qty 15

## 2023-09-23 MED ORDER — DROPERIDOL 2.5 MG/ML IJ SOLN: 0.6250 mg | Freq: Once | INTRAMUSCULAR | Status: DC | PRN

## 2023-09-23 MED ORDER — METOCLOPRAMIDE HCL 5 MG/ML IJ SOLN
10.0000 mg | Freq: Four times a day (QID) | INTRAMUSCULAR | Status: AC
  Administered 2023-09-23 – 2023-09-24 (×6): 10 mg via INTRAVENOUS
  Filled 2023-09-23 (×6): qty 2

## 2023-09-23 MED ORDER — PROTAMINE SULFATE 10 MG/ML IV SOLN
INTRAVENOUS | Status: DC | PRN
  Administered 2023-09-23: 360 mg via INTRAVENOUS
  Administered 2023-09-23: 10 mg via INTRAVENOUS

## 2023-09-23 MED ORDER — DEXTROSE 50 % IV SOLN: 0.0000 mL | INTRAVENOUS | Status: DC | PRN

## 2023-09-23 MED ORDER — ALBUMIN HUMAN 5 % IV SOLN: INTRAVENOUS | Status: DC | PRN

## 2023-09-23 MED ORDER — TRAMADOL HCL 50 MG PO TABS
50.0000 mg | ORAL_TABLET | ORAL | Status: DC | PRN
  Administered 2023-09-24: 100 mg via ORAL
  Filled 2023-09-23: qty 2

## 2023-09-23 MED ORDER — DEXMEDETOMIDINE HCL IN NACL 400 MCG/100ML IV SOLN
INTRAVENOUS | Status: DC | PRN
  Administered 2023-09-23: .4 ug/kg/h via INTRAVENOUS

## 2023-09-23 MED ORDER — SODIUM CHLORIDE 0.9% FLUSH
3.0000 mL | Freq: Two times a day (BID) | INTRAVENOUS | Status: DC
  Administered 2023-09-23 (×2): 10 mL via INTRAVENOUS
  Administered 2023-09-24: 3 mL via INTRAVENOUS
  Administered 2023-09-24: 10 mL via INTRAVENOUS

## 2023-09-23 MED ORDER — ASPIRIN 325 MG PO TBEC
325.0000 mg | DELAYED_RELEASE_TABLET | Freq: Every day | ORAL | Status: DC
  Administered 2023-09-24 – 2023-09-28 (×5): 325 mg via ORAL
  Filled 2023-09-23 (×5): qty 1

## 2023-09-23 MED ORDER — HEMOSTATIC AGENTS (NO CHARGE) OPTIME
TOPICAL | Status: DC | PRN
  Administered 2023-09-23: 1 via TOPICAL

## 2023-09-23 MED ORDER — PHENYLEPHRINE HCL-NACL 20-0.9 MG/250ML-% IV SOLN
INTRAVENOUS | Status: DC | PRN
  Administered 2023-09-23: 15 ug/min via INTRAVENOUS

## 2023-09-23 MED ORDER — MAGNESIUM SULFATE 4 GM/100ML IV SOLN
4.0000 g | Freq: Once | INTRAVENOUS | Status: AC
  Administered 2023-09-23: 4 g via INTRAVENOUS
  Filled 2023-09-23: qty 100

## 2023-09-23 MED ORDER — CHLORHEXIDINE GLUCONATE 0.12 % MT SOLN
15.0000 mL | Freq: Once | OROMUCOSAL | Status: AC
  Administered 2023-09-23: 15 mL via OROMUCOSAL

## 2023-09-23 MED ORDER — FENTANYL CITRATE (PF) 100 MCG/2ML IJ SOLN
INTRAMUSCULAR | Status: DC | PRN
  Administered 2023-09-23: 25 ug via INTRAVENOUS
  Administered 2023-09-23: 50 ug via INTRAVENOUS
  Administered 2023-09-23: 150 ug via INTRAVENOUS
  Administered 2023-09-23: 25 ug via INTRAVENOUS
  Administered 2023-09-23 (×2): 50 ug via INTRAVENOUS
  Administered 2023-09-23 (×5): 100 ug via INTRAVENOUS
  Administered 2023-09-23: 300 ug via INTRAVENOUS
  Administered 2023-09-23: 100 ug via INTRAVENOUS

## 2023-09-23 MED ORDER — ORAL CARE MOUTH RINSE: 15.0000 mL | Freq: Once | OROMUCOSAL | Status: AC

## 2023-09-23 MED ORDER — NAPHAZOLINE-GLYCERIN 0.012-0.25 % OP SOLN: 1.0000 [drp] | Freq: Four times a day (QID) | OPHTHALMIC | Status: DC | PRN

## 2023-09-23 MED ORDER — METOPROLOL TARTRATE 25 MG/10 ML ORAL SUSPENSION
12.5000 mg | Freq: Two times a day (BID) | ORAL | Status: DC
  Administered 2023-09-23: 12.5 mg
  Filled 2023-09-23: qty 10

## 2023-09-23 MED ORDER — THROMBIN 20000 UNITS EX SOLR: OROMUCOSAL | Status: DC | PRN

## 2023-09-23 MED ORDER — PANTOPRAZOLE SODIUM 40 MG IV SOLR
40.0000 mg | Freq: Every day | INTRAVENOUS | Status: AC
Start: 2023-09-23 — End: 2023-09-25
  Administered 2023-09-23 – 2023-09-24 (×2): 40 mg via INTRAVENOUS
  Filled 2023-09-23 (×2): qty 10

## 2023-09-23 MED FILL — Nitroglycerin IV Soln 200 MCG/ML in D5W: INTRAVENOUS | Qty: 250 | Status: AC

## 2023-09-23 SURGICAL SUPPLY — 72 items
ADAPTER CARDIO PERF ANTE/RETRO (ADAPTER) ×3 IMPLANT
BAG DECANTER FOR FLEXI CONT (MISCELLANEOUS) ×3 IMPLANT
BLADE CLIPPER SURG (BLADE) ×3 IMPLANT
BLADE STERNUM SYSTEM 6 (BLADE) ×3 IMPLANT
BLADE SURG 15 STRL LF DISP TIS (BLADE) ×3 IMPLANT
CANISTER SUCT 3000ML PPV (MISCELLANEOUS) ×3 IMPLANT
CANNULA ARTERIAL NVNT 3/8 22FR (MISCELLANEOUS) IMPLANT
CANNULA GUNDRY RCSP 15FR (MISCELLANEOUS) ×3 IMPLANT
CANNULA MC2 2 STG 36/46 CONN (CANNULA) IMPLANT
CATH HEART VENT LEFT (CATHETERS) ×3 IMPLANT
CATH ROBINSON RED A/P 18FR (CATHETERS) ×9 IMPLANT
CATH THORACIC 36FR (CATHETERS) ×3 IMPLANT
CATH THORACIC 36FR RT ANG (CATHETERS) ×3 IMPLANT
CNTNR URN SCR LID CUP LEK RST (MISCELLANEOUS) ×3 IMPLANT
CONTAINER PROTECT SURGISLUSH (MISCELLANEOUS) ×6 IMPLANT
COVER SURGICAL LIGHT HANDLE (MISCELLANEOUS) ×3 IMPLANT
DEVICE SUT CK QUICK LOAD INDV (Prosthesis & Implant Heart) IMPLANT
DEVICE SUT CK QUICK LOAD MINI (Prosthesis & Implant Heart) IMPLANT
DRAPE SRG 135X102X78XABS (DRAPES) ×3 IMPLANT
DRAPE WARM FLUID 44X44 (DRAPES) ×3 IMPLANT
DRSG COVADERM 4X14 (GAUZE/BANDAGES/DRESSINGS) ×3 IMPLANT
ELECT CAUTERY BLADE 6.4 (BLADE) ×3 IMPLANT
ELECT REM PT RETURN 9FT ADLT (ELECTROSURGICAL) ×4 IMPLANT
ELECTRODE REM PT RTRN 9FT ADLT (ELECTROSURGICAL) ×6 IMPLANT
FELT TEFLON 1X6 (MISCELLANEOUS) ×6 IMPLANT
GAUZE 4X4 16PLY ~~LOC~~+RFID DBL (SPONGE) ×3 IMPLANT
GAUZE SPONGE 4X4 12PLY STRL (GAUZE/BANDAGES/DRESSINGS) ×3 IMPLANT
GAUZE SPONGE 4X4 12PLY STRL LF (GAUZE/BANDAGES/DRESSINGS) IMPLANT
GLOVE SURG MICRO LTX SZ7 (GLOVE) ×6 IMPLANT
GOWN STRL REUS W/ TWL LRG LVL3 (GOWN DISPOSABLE) ×12 IMPLANT
GOWN STRL REUS W/ TWL XL LVL3 (GOWN DISPOSABLE) ×3 IMPLANT
HEMOSTAT POWDER SURGIFOAM 1G (HEMOSTASIS) ×9 IMPLANT
HEMOSTAT SURGICEL 2X14 (HEMOSTASIS) ×3 IMPLANT
KIT BASIN OR (CUSTOM PROCEDURE TRAY) ×3 IMPLANT
KIT CATH CPB BARTLE (MISCELLANEOUS) ×3 IMPLANT
KIT SUCTION CATH 14FR (SUCTIONS) ×3 IMPLANT
KIT SUT CK MINI COMBO 4X17 (Prosthesis & Implant Heart) IMPLANT
KIT TURNOVER KIT B (KITS) ×3 IMPLANT
LINE VENT (MISCELLANEOUS) IMPLANT
NS IRRIG 1000ML POUR BTL (IV SOLUTION) ×18 IMPLANT
PACK E OPEN HEART (SUTURE) ×3 IMPLANT
PACK OPEN HEART (CUSTOM PROCEDURE TRAY) ×3 IMPLANT
PAD ARMBOARD 7.5X6 YLW CONV (MISCELLANEOUS) ×6 IMPLANT
POSITIONER HEAD DONUT 9IN (MISCELLANEOUS) ×3 IMPLANT
SET MPS 3-ND DEL (MISCELLANEOUS) IMPLANT
SPONGE T-LAP 18X18 ~~LOC~~+RFID (SPONGE) ×12 IMPLANT
SPONGE T-LAP 4X18 ~~LOC~~+RFID (SPONGE) ×3 IMPLANT
SUT BONE WAX W31G (SUTURE) ×3 IMPLANT
SUT EB EXC GRN/WHT 2-0 V-5 (SUTURE) ×6 IMPLANT
SUT ETHIBON EXCEL 2-0 V-5 (SUTURE) IMPLANT
SUT ETHIBOND 2 0 SH 36X2 (SUTURE) IMPLANT
SUT ETHIBOND V-5 VALVE (SUTURE) IMPLANT
SUT PROLENE 3 0 SH DA (SUTURE) IMPLANT
SUT PROLENE 3 0 SH1 36 (SUTURE) ×3 IMPLANT
SUT PROLENE 4-0 RB1 .5 CRCL 36 (SUTURE) ×9 IMPLANT
SUT PROLENE 5 0 C 1 36 (SUTURE) IMPLANT
SUT STEEL 6MS V (SUTURE) IMPLANT
SUT STEEL STERNAL CCS#1 18IN (SUTURE) IMPLANT
SUT STEEL SZ 6 DBL 3X14 BALL (SUTURE) IMPLANT
SUT VIC AB 1 CTX36XBRD ANBCTR (SUTURE) ×6 IMPLANT
SUT VIC AB 2-0 CTX 36 (SUTURE) IMPLANT
SUT VICRYL 3-0 27 CRC X-1 (SUTURE) IMPLANT
SYSTEM SAHARA CHEST DRAIN ATS (WOUND CARE) ×3 IMPLANT
TAPE CLOTH SURG 4X10 WHT LF (GAUZE/BANDAGES/DRESSINGS) IMPLANT
TAPE PAPER 2X10 WHT MICROPORE (GAUZE/BANDAGES/DRESSINGS) IMPLANT
TOWEL GREEN STERILE (TOWEL DISPOSABLE) ×3 IMPLANT
TOWEL GREEN STERILE FF (TOWEL DISPOSABLE) ×3 IMPLANT
TRAY FOLEY SLVR 16FR TEMP STAT (SET/KITS/TRAYS/PACK) ×3 IMPLANT
UNDERPAD 30X36 HEAVY ABSORB (UNDERPADS AND DIAPERS) ×3 IMPLANT
VALVE AORTIC SZ27 INSP/RESIL (Valve) IMPLANT
VENT LEFT HEART 12002 (CATHETERS) ×2 IMPLANT
WATER STERILE IRR 1000ML POUR (IV SOLUTION) ×6 IMPLANT

## 2023-09-23 NOTE — Procedures (Signed)
Extubation Procedure Note  Patient Details:   Name: Aundrea Higginbotham DOB: [birth year] MRN: [id number]   Airway Documentation:    Vent end date: 09/23/23 Vent end time: 1622   Evaluation  O2 sats: stable throughout Complications: No apparent complications Patient did tolerate procedure well. Bilateral Breath Sounds: Clear   Yes  Pt extubated per rapid wean protocol to 4L [place]. Pt passed all parameters with a NIF -25 and VC 0.9L. Pt had a positive cuff leak, good cough, able to state name and no stridor noted.   Kerri Perches 09/23/2023, 4:27 PM

## 2023-09-23 NOTE — Hospital Course (Addendum)
PCP is Ivonne Andrew, NP Referring Provider is Perlie Gold, PA-C  History of Present Illness:   The patient is a 63 year old gentleman with a history of hypertension, hyperlipidemia, stage III chronic kidney disease, previous TIA, and aortic insufficiency that has been followed by cardiology.  He has been lost to follow-up for periods of time.  He had a TEE on 11/30/2022 showing a trileaflet aortic valve with severe regurgitation.  There was an eccentric jet with a pressure half-time of 129 ms.  Left ventricular ejection fraction was 65 to 70% with no LV dilation.  He underwent cardiac catheterization on the same day showing a 80% stenosis of a small nondominant right coronary artery.  There was no significant obstructive coronary disease in any of the other vessels.  Left and right heart pressures were within normal limits with an LVEDP of 12 and a cardiac index of 3.5.  The pulse pressure was not wide at 133/76.  His most recent echo on 08/11/2023 continue to show severe eccentric AI with holodiastolic flow reversal in the descending aorta.  Left ventricular systolic function was normal with a diastolic diameter of 5 cm.  Aortic root diameter was measured at 3.5 cm with an ascending aortic diameter 3.8 cm.   The patient lives by himself.  He reports development of some fatigue and tiredness with activity.  He denies any shortness of breath or orthopnea.  He has had no peripheral edema.  He denies any chest pain or pressure.  Has had no dizziness or syncope.  He is unemployed and not physically very active.  This 63 year old gentleman has stage D, severe, symptomatic aortic insufficiency with NYHA class II symptoms of exertional fatigue consistent with chronic diastolic congestive heart failure. I agree that aortic valve replacement is indicated for relief of his symptoms and to prevent progressive left ventricular dysfunction. I recommended open surgical aortic valve replacement using a bioprosthetic  valve. I discussed the operative procedure with the patient and his brother including alternatives, benefits and risks; including but not limited to bleeding, blood transfusion, infection, stroke, myocardial infarction, graft failure, heart block requiring a permanent pacemaker, organ dysfunction, and death. Pleas Patricia understands and agrees to proceed.   Hospital Course: Mr. Panther was admitted for elective surgery on 05/22/2024.  He was taken to the operating room where aortic valve was Mr. Douthat was admitted for elective surgery on 04/22/2024.  He was taken to the operating room where aortic valve was accomplished utilizing a 27 mm Resilia bioprosthetic valve.  Following the procedure, he separated from cardiopulmonary bypass without difficulty on no inotropes.  He was transferred to the surgical ICU in stable condition. He developed probable pericarditis based on diffuse ST elevation on his ECG, he was started on colchicine. He was routinely diuresed. Epicardial pacing wires were removed without complication. He was transferred to the progressive care unit on post-op day 3. Diet and activity were advanced and he progressed well. He remained in sinus rhythm with occasional sinus tachycardia.

## 2023-09-23 NOTE — Anesthesia Procedure Notes (Signed)
Procedure Name: Intubation Date/Time: 09/23/2023 7:34 AM  Performed by: Sudie Grumbling, CRNAPre-anesthesia Checklist: Patient identified, Emergency Drugs available, Suction available and Patient being monitored Patient Re-evaluated:Patient Re-evaluated prior to induction Oxygen Delivery Method: Circle system utilized Preoxygenation: Pre-oxygenation with 100% oxygen Induction Type: IV induction Ventilation: Two handed mask ventilation required and Oral airway inserted - appropriate to patient size Laryngoscope Size: Mac and 4 Grade View: Grade I Tube type: Oral Tube size: 8.0 mm Number of attempts: 1 Airway Equipment and Method: Stylet and Oral airway Placement Confirmation: ETT inserted through vocal cords under direct vision, positive ETCO2 and breath sounds checked- equal and bilateral Secured at: 22 cm Tube secured with: Tape Dental Injury: Teeth and Oropharynx as per pre-operative assessment

## 2023-09-23 NOTE — Interval H&P Note (Signed)
History and Physical Interval Note:  09/23/2023 6:54 AM  Carlos Hayes  has presented today for surgery, with the diagnosis of SEVERE AI.  The various methods of treatment have been discussed with the patient and family. After consideration of risks, benefits and other options for treatment, the patient has consented to  Procedure(s): AORTIC VALVE REPLACEMENT (AVR) (N/A) TRANSESOPHAGEAL ECHOCARDIOGRAM (TEE) (N/A) as a surgical intervention.  The patient's history has been reviewed, patient examined, no change in status, stable for surgery.  I have reviewed the patient's chart and labs.  Questions were answered to the patient's satisfaction.     Alleen Borne

## 2023-09-23 NOTE — Progress Notes (Signed)
      [address]       [place] [address]             [phone]     S/p AVR  Extubated, no complaints  BP 93/64   Pulse 79   Temp 98.2 F (36.8 C)   Resp 18   Ht 5\' 7"  (1.702 m)   Wt 103.9 kg   SpO2 98%   BMI 35.87 kg/m   27/15, CO 4.1, CI 1.92 Off neo and NTG   Intake/Output Summary (Last 24 hours) at 09/23/2023 1725 Last data filed at 09/23/2023 1700 Gross per 24 hour  Intake 2918.36 ml  Output 1615 ml  Net 1303.36 ml   Minimal CT output K 3.9,. Hct 34  Doing well early postop  Viviann Spare C. Dorris Fetch, MD [facility] [phone]

## 2023-09-23 NOTE — Brief Op Note (Signed)
09/23/2023  10:19 AM  PATIENT:  Carlos Hayes  63 y.o. male  PRE-OPERATIVE DIAGNOSIS:  SEVERE AORTIC INSUFFICIENCY  POST-OPERATIVE DIAGNOSIS:  SEVERE AORTIC INSUFFICIENCY  PROCEDURE:   AORTIC VALVE REPLACEMENT (AVR) USING INSPIRIS RESILIA AORTIC VALVE SIZE 27 MM   TRANSESOPHAGEAL ECHOCARDIOGRAM   SURGEON: Alleen Borne, MD   PHYSICIAN ASSISTANT: Erma Raiche  ASSISTANTS: Ferne Coe, RN, RN First Assistant         Pesare, Piyanuch, RN, Scrub Person   ANESTHESIA:   general  EBL:  BLOOD ADMINISTERED:none  DRAINS:  mediastinal tubes    SPECIMEN:  Aortic valve leaflets  DISPOSITION OF SPECIMEN:  PATHOLOGY  COUNTS:  Correct  DICTATION: .Dragon Dictation  PLAN OF CARE: Admit to inpatient   PATIENT DISPOSITION:  ICU - intubated and hemodynamically stable.   Delay start of Pharmacological VTE agent (>24hrs) due to surgical blood loss or risk of bleeding: yes

## 2023-09-23 NOTE — Discharge Summary (Signed)
Physician Discharge Summary  Patient ID: Carlos Hayes MRN: [id number] DOB/AGE: 61-Aug-1962 61 y.o.  Admit date: 09/23/2023 Discharge date: 09/28/2023  Admission Diagnoses:  Severe aortic insufficiency Hypertension Pre-diabetes Hyperlipidemia Stage 3 chronic kidney disease Coronary artery disease History of TIA   Discharge Diagnoses:   Severe aortic insufficiency Hypertension Pre-diabetes Hyperlipidemia Stage 3 chronic kidney disease Coronary artery disease History of TIA S/P bioprosthetic aortic valve replacement  Discharged Condition: stable  PCP is Ivonne Andrew, NP Referring Provider is Perlie Gold, PA-C  History of Present Illness:   The patient is a 63 year old gentleman with a history of hypertension, hyperlipidemia, stage III chronic kidney disease, previous TIA, and aortic insufficiency that has been followed by cardiology.  He has been lost to follow-up for periods of time.  He had a TEE on 11/30/2022 showing a trileaflet aortic valve with severe regurgitation.  There was an eccentric jet with a pressure half-time of 129 ms.  Left ventricular ejection fraction was 65 to 70% with no LV dilation.  He underwent cardiac catheterization on the same day showing a 80% stenosis of a small nondominant right coronary artery.  There was no significant obstructive coronary disease in any of the other vessels.  Left and right heart pressures were within normal limits with an LVEDP of 12 and a cardiac index of 3.5.  The pulse pressure was not wide at 133/76.  His most recent echo on 08/11/2023 continue to show severe eccentric AI with holodiastolic flow reversal in the descending aorta.  Left ventricular systolic function was normal with a diastolic diameter of 5 cm.  Aortic root diameter was measured at 3.5 cm with an ascending aortic diameter 3.8 cm.   The patient lives by himself.  He reports development of some fatigue and tiredness with activity.  He denies any shortness  of breath or orthopnea.  He has had no peripheral edema.  He denies any chest pain or pressure.  Has had no dizziness or syncope.  He is unemployed and not physically very active.  This 63 year old gentleman has stage D, severe, symptomatic aortic insufficiency with NYHA class II symptoms of exertional fatigue consistent with chronic diastolic congestive heart failure. I agree that aortic valve replacement is indicated for relief of his symptoms and to prevent progressive left ventricular dysfunction. I recommended open surgical aortic valve replacement using a bioprosthetic valve. I discussed the operative procedure with the patient and his brother including alternatives, benefits and risks; including but not limited to bleeding, blood transfusion, infection, stroke, myocardial infarction, graft failure, heart block requiring a permanent pacemaker, organ dysfunction, and death. Carlos Hayes understands and agrees to proceed.   Hospital Course: Carlos Hayes was admitted for elective surgery on 05/22/2024.  He was taken to the operating room where aortic valve was Carlos Hayes was admitted for elective surgery on 04/22/2024.  He was taken to the operating room where aortic valve was accomplished utilizing a 27 mm Resilia bioprosthetic valve.  Following the procedure, he separated from cardiopulmonary bypass without difficulty on no inotropes.  He was transferred to the surgical ICU in stable condition. He developed probable pericarditis based on diffuse ST elevation on his ECG, he was started on colchicine. He was routinely diuresed. Epicardial pacing wires were removed without complication. He was transferred to the progressive care unit on post-op day 3. Diet and activity were advanced and he progressed well. He remained in sinus rhythm with occasional sinus tachycardia.  He was ready for discharge on post-op day 5.  Consults: None  Significant Diagnostic Studies:  CLINICAL DATA:  Status post  aortic valve replacement.   EXAM: CHEST - 2 VIEW   COMPARISON:  09/25/2023   FINDINGS: Stable enlarged cardiac silhouette, median sternotomy wires, mediastinal surgical clips and prosthetic aortic valve. Left lower lobe patchy density with improvement. Clear right lung small left pleural effusion.   IMPRESSION: 1. Improved left lower lobe atelectasis or pneumonia. 2. Small left pleural effusion. 3. Stable cardiomegaly.     Electronically Signed   By: Beckie Salts M.D.   On: 09/26/2023 09:50  Treatments:  CARDIOVASCULAR SURGERY   09/23/2023 Carlos Hayes [id number]   Surgeon:  Alleen Borne, MD   First Assistant: Jillyn Hidden, PA-C: An experienced assistant was required given the complexity of this surgery and the standard of surgical care. The assistant was needed for exposure, dissection, suctioning, retraction of delicate tissues and sutures, instrument exchange and for overall help during this procedure.      Preoperative Diagnosis:  Severe aortic insufficiency     Postoperative Diagnosis:  Same     Procedure:   Median Sternotomy Extracorporeal circulation 3.   Aortic valve replacement using a 27 mm Edwards INSPIRIS RESILIA pericardial valve.   Anesthesia:  General Endotracheal    Discharge Exam: Blood pressure 111/75, pulse 92, temperature 98.7 F (37.1 C), temperature source Axillary, resp. rate 13, height 5\' 7"  (1.702 m), weight 101.2 kg, SpO2 94%.  General appearance: alert, cooperative, and no distress Neurologic: intact Heart: SR, few PAC's Lungs: clear breath sounds, normal work of breathing on RA Extremities: no peripheral edema Wound: the sternotomy incision is clean and dry  Disposition: Discharged to home in stable condition  Discharge Instructions     Amb Referral to Cardiac Rehabilitation   Complete by: As directed    Diagnosis: Valve Replacement   Valve: Aortic   After initial evaluation and assessments completed: Virtual  Based Care may be provided alone or in conjunction with Phase 2 Cardiac Rehab based on patient barriers.: Yes   Intensive Cardiac Rehabilitation (ICR) [facility] location only OR Traditional Cardiac Rehabilitation (TCR) *If criteria for ICR are not met will enroll in TCR [facility] only): Yes      Allergies as of 09/28/2023       Reactions   Lipitor [atorvastatin] Swelling   Causes lip to swell        Medication List     STOP taking these medications    amLODipine 10 MG tablet Commonly known as: NORVASC   chlorthalidone 25 MG tablet Commonly known as: HYGROTON   irbesartan 150 MG tablet Commonly known as: AVAPRO   irbesartan 300 MG tablet Commonly known as: AVAPRO       TAKE these medications    acetaminophen 325 MG tablet Commonly known as: TYLENOL Take 2 tablets (650 mg total) by mouth every 6 (six) hours.   APPLE CIDER VINEGAR PO Take 800 mg by mouth daily. 400 mg each   aspirin EC 325 MG tablet Take 1 tablet (325 mg total) by mouth daily. What changed:  medication strength how much to take when to take this additional instructions   Cinnamon 500 MG capsule Take 500 mg by mouth daily.   colchicine 0.6 MG tablet TAKE 1 TABLET (0.6MG  TOTAL) BY MOUTH DAILY AS NEEDED FOR GOUT   cyanocobalamin 1000 MCG tablet Commonly known as: VITAMIN B12 Take 1,000 mcg by mouth daily.   ezetimibe 10 MG tablet Commonly known as: ZETIA TAKE 1 TABLET BY MOUTH EVERY DAY  metoprolol succinate 50 MG 24 hr tablet Commonly known as: TOPROL-XL Take 1 tablet (50 mg total) by mouth daily. Take with or immediately following a meal. What changed:  medication strength how much to take additional instructions   oxyCODONE 5 MG immediate release tablet Commonly known as: Oxy IR/ROXICODONE Take 1 tablet (5 mg total) by mouth every 6 (six) hours as needed for up to 7 days for severe pain (pain score 7-10).   rosuvastatin 20 MG tablet Commonly known as: CRESTOR Take 1 tablet (20 mg  total) by mouth daily.   tetrahydrozoline 0.05 % ophthalmic solution Place 1 drop into both eyes daily as needed (Dry eyes).               Durable Medical Equipment  (From admission, onward)           Start     Ordered   09/27/23 1440  For home use only DME Walker rolling  Once       Question Answer Comment  Walker: With 5 Inch Wheels   Patient needs a walker to treat with the following condition Physical deconditioning      09/27/23 1439            Follow-up Information     [place] [facility]. Go on 10/07/2023.   Specialty: Cardiothoracic Surgery Why: Your appointment for suture removal is  10am. Contact information: [address] [place], [address] [place] [address] [phone]        [facility]. Go on 11/04/2023.   Specialty: Cardiology Why: Your appointment for a follow up echocardiogram is at 11:30am on Friday,  March 28 Contact information: [address] [place] [address] [phone]        [facility]. Go on 10/24/2023.   Specialty: Cardiothoracic Surgery Why: Your appointment is at 2pm. Please obtain a chest x-ray 1 hour before the appointment at [facility] located at [address] Contact information: [address] [place], [address] [place] [address] [phone]        Christell Constant, MD. Go on 10/10/2023.   Specialty: Cardiology Why: YOur appointment is at 3:40pm Contact information: [address] [place] [address] [phone]         [facility] Follow up.   Why: ([employer])- rolling walker arranged - to be delivered to room prior to discharge Contact information: [address] [place] [address] [phone]                 The patient has been discharged on:   1.Beta Blocker:  Yes [ x ]                               No   [   ]                              If No, reason:  2.Ace Inhibitor/ARB: Yes [   ]                                     No  [ x ]  If No, reason: Not appropriate, will be resumed at outpatient follow up visit if needed  3.Statin:   Yes [x ]                  No  [   ]                  If No, reason:  4.Ecasa:  Yes  [x ]                  No   [   ]                  If No, reason:  5. ACS on Admission? No  P2Y12 Inhibitor:  Yes  [   ]                                No  [ x ]    Signed: Leary Roca, PA-C 09/28/2023, 7:46 AM

## 2023-09-23 NOTE — Anesthesia Procedure Notes (Signed)
Central Venous Catheter Insertion Performed by: Lewie Loron, MD, anesthesiologist Start/End2/14/2025 7:00 AM, 09/23/2023 7:10 AM Patient location: Pre-op. Preanesthetic checklist: patient identified, IV checked, site marked, risks and benefits discussed, surgical consent, monitors and equipment checked, pre-op evaluation, timeout performed and anesthesia consent Hand hygiene performed  and maximum sterile barriers used  PA cath was placed.Swan type:thermodilution PA Cath depth:44 Procedure performed without using ultrasound guided technique. Attempts: 1 Post procedure assessment: free fluid flow and no air  Patient tolerated the procedure well with no immediate complications.

## 2023-09-23 NOTE — Telephone Encounter (Signed)
Ordering echo s/p AVR. Sending results to Dr. Laneta Simmers and Willeen Cass.

## 2023-09-23 NOTE — Plan of Care (Signed)
Problem: Education: Goal: Knowledge of General Education information will improve Description Including pain rating scale, medication(s)/side effects and non-pharmacologic comfort measures Outcome: Progressing

## 2023-09-23 NOTE — Transfer of Care (Signed)
Immediate Anesthesia Transfer of Care Note  Patient: Cyruss Arata  Procedure(s) Performed: AORTIC VALVE REPLACEMENT (AVR) USING INSPIRIS RESILIA AORTIC VALVE SIZE 27 MM (Chest) TRANSESOPHAGEAL ECHOCARDIOGRAM (TEE)  Patient Location: SICU  Anesthesia Type:General  Level of Consciousness: sedated and Patient remains intubated per anesthesia plan  Airway & Oxygen Therapy: Patient remains intubated per anesthesia plan and Patient placed on Ventilator (see vital sign flow sheet for setting)  Post-op Assessment: Report given to RN and Post -op Vital signs reviewed and stable  Post vital signs: Reviewed and stable  Last Vitals:  Vitals Value Taken Time  BP 93/69 09/23/23 1145  Temp    Pulse 71 09/23/23 1146  Resp 16 09/23/23 1146  SpO2 100 % 09/23/23 1146  Vitals shown include unfiled device data.  Last Pain:  Vitals:   09/23/23 0607  PainSc: 0-No pain         Complications: No notable events documented.

## 2023-09-23 NOTE — Discharge Instructions (Signed)
Discharge Instructions:  1. You may shower, please wash incisions daily with soap and water and keep dry.  If you wish to cover wounds with dressing you may do so but please keep clean and change daily.  No tub baths or swimming until incisions have completely healed.  If your incisions become red or develop any drainage please call our office at [phone]  2. No Driving until cleared by Dr. Laneta Simmers office and you are no longer using narcotic pain medications  3. Monitor your weight daily.. Please use the same scale and weigh at same time... If you gain 5-10 lbs in 48 hours with associated lower extremity swelling, please contact our office at [phone]  4. Fever of 101.5 for at least 24 hours with no source, please contact our office at [phone]  5. Activity- up as tolerated, please walk at least 3 times per day.  Avoid strenuous activity, no lifting, pushing, or pulling with your arms over 8-10 lbs for a minimum of 6 weeks  6. If any questions or concerns arise, please do not hesitate to contact our office at [phone]

## 2023-09-23 NOTE — Op Note (Signed)
CARDIOVASCULAR SURGERY OPERATIVE NOTE  09/23/2023 Carlos Hayes [id number]  Surgeon:  Alleen Borne, MD  First Assistant: Jillyn Hidden, PA-C: An experienced assistant was required given the complexity of this surgery and the standard of surgical care. The assistant was needed for exposure, dissection, suctioning, retraction of delicate tissues and sutures, instrument exchange and for overall help during this procedure.    Preoperative Diagnosis:  Severe aortic insufficiency   Postoperative Diagnosis:  Same   Procedure:  Median Sternotomy Extracorporeal circulation 3.   Aortic valve replacement using a 27 mm Edwards INSPIRIS RESILIA pericardial valve.  Anesthesia:  General Endotracheal   Clinical History/Surgical Indication:  This 63 year old gentleman has stage D, severe, symptomatic aortic insufficiency with NYHA class II symptoms of exertional fatigue consistent with chronic diastolic congestive heart failure. I agree that aortic valve replacement is indicated for relief of his symptoms and to prevent progressive left ventricular dysfunction. I recommended open surgical aortic valve replacement using a bioprosthetic valve. I discussed the operative procedure with the patient and his brother including alternatives, benefits and risks; including but not limited to bleeding, blood transfusion, infection, stroke, myocardial infarction, graft failure, heart block requiring a permanent pacemaker, organ dysfunction, and death. Carlos Hayes understands and agrees to proceed.   Preparation:  The patient was seen in the preoperative holding area and the correct patient, correct operation were confirmed with the patient after reviewing the medical record and catheterization. The consent was signed by me. Preoperative antibiotics were given. A pulmonary arterial line and radial arterial line were placed by the anesthesia team. The patient was taken back to the operating room and  positioned supine on the operating room table. After being placed under general endotracheal anesthesia by the anesthesia team a foley catheter was placed. The neck, chest, abdomen, and both legs were prepped with betadine soap and solution and draped in the usual sterile manner. A surgical time-out was taken and the correct patient and operative procedure were confirmed with the nursing and anesthesia staff.   Pre-bypass TEE:   Complete TEE assessment was performed by Dr. Lewie Loron . This showed a trileaflet aortic valve with severe AI. LVEF normal with mild concentric LVH.    Post-bypass TEE:   Normal functioning prosthetic aortic valve with no perivalvular leak or regurgitation through the valve. Left ventricular function preserved. No mitral regurgitation.    Cardiopulmonary Bypass:  A median sternotomy was performed. The pericardium was opened in the midline. Right ventricular function appeared normal. The ascending aorta was of normal size and had no palpable plaque. There were no contraindications to aortic cannulation or cross-clamping. The patient was fully systemically heparinized and the ACT was maintained > 400 sec. The proximal aortic arch was cannulated with a 22 F aortic cannula for arterial inflow. Venous cannulation was performed via the right atrial appendage using a two-staged venous cannula. An antegrade cardioplegia/vent cannula was inserted into the mid-ascending aorta. A left ventricular vent was placed via the right superior pulmonary vein. A retrograde cardioplegia cannnula was placed into the coronary sinus via the right atrium. Aortic occlusion was performed with a single cross-clamp. Systemic cooling to 32 degrees Centigrade and topical cooling of the heart with iced saline were used. Cold retrograde KBC cardioplegia was used to induce diastolic arrest. A temperature probe was inserted into the interventricular septum and an insulating pad was placed in the  pericardium. Carbon dioxide was insufflated into the pericardium at 5L/min throughout the procedure to minimize intracardiac air.   Aortic Valve  Replacement: Assisted by Jillyn Hidden, PA-C  A transverse aortotomy was performed 1 cm above the take-off of the right coronary artery. The native valve was tricuspid with multiple fenestrations at the commissure between the right/non and left/non cusps with marked prolapse of the non-coronary cusp. The ostia of the coronary arteries were in normal position and were not obstructed. The native valve leaflets were excised. Care was taken to remove all particulate debris. The left ventricle was directly inspected for debris and then irrigated with ice saline solution. The annulus was sized and a size 27 mm INSPIRIS RESILIA pericardial valve was chosen. The model number was [id number] and the serial number was [id number]. While the valve was being prepared 2-0 Ethibond pledgeted horizontal mattress sutures were placed around the annulus with the pledgets in a sub-annular position. The sutures were placed through the sewing ring and the valve lowered into place. The sutures were tied using CorKnots. The valve seated nicely and the coronary ostia were not obstructed. The prosthetic valve leaflets moved normally and there was no sub-valvular obstruction. The aortotomy was closed using 4-0 Prolene suture in 2 layers with felt strips to reinforce the closure.  Completion:  The patient was rewarmed to 37 degrees Centigrade. De-airing maneuvers were performed and the head placed in trendelenburg position. The crossclamp was removed with a time of 69 minutes. There was spontaneous return of sinus rhythm. The aortotomy was checked for hemostasis. Two temporary epicardial pacing wires were placed on the right atrium and two on the right ventricle. The left ventricular vent and retrograde cardioplegia cannulas were removed. The patient was weaned from CPB without difficulty on no  inotropes. CPB time was 91 minutes. Cardiac output was 5 LPM. Heparin was fully reversed with protamine and the aortic and venous cannulas removed. Hemostasis was achieved. Mediastinal drainage tubes were placed. The sternum was closed with double #6 stainless steel wires. The fascia was closed with continuous # 1 vicryl suture. The subcutaneous tissue was closed with 2-0 vicryl continuous suture. The skin was closed with 3-0 vicryl subcuticular suture. All sponge, needle, and instrument counts were reported correct at the end of the case. Dry sterile dressings were placed over the incisions and around the chest tubes which were connected to pleurevac suction. The patient was then transported to the surgical intensive care unit in stable condition.

## 2023-09-23 NOTE — Anesthesia Procedure Notes (Signed)
Central Venous Catheter Insertion Performed by: Lewie Loron, MD, anesthesiologist Start/End2/14/2025 6:40 AM, 09/23/2023 7:00 AM Patient location: Pre-op. Preanesthetic checklist: patient identified, IV checked, site marked, risks and benefits discussed, surgical consent, monitors and equipment checked, pre-op evaluation, timeout performed and anesthesia consent Position: Trendelenburg Lidocaine 1% used for infiltration and patient sedated Hand hygiene performed  and maximum sterile barriers used  Catheter size: 8.5 Fr Sheath introducer Procedure performed using ultrasound guided technique. Ultrasound Notes:anatomy identified, needle tip was noted to be adjacent to the nerve/plexus identified, no ultrasound evidence of intravascular and/or intraneural injection and image(s) printed for medical record Attempts: 1 Following insertion, line sutured, dressing applied and Biopatch. Post procedure assessment: blood return through all ports, free fluid flow and no air  Patient tolerated the procedure well with no immediate complications.

## 2023-09-23 NOTE — Anesthesia Procedure Notes (Signed)
Arterial Line Insertion Start/End2/14/2025 6:57 AM Performed by: Alease Medina, CRNA, CRNA  Patient location: Pre-op. Preanesthetic checklist: patient identified, IV checked, site marked, risks and benefits discussed, surgical consent, monitors and equipment checked, pre-op evaluation, timeout performed and anesthesia consent Lidocaine 1% used for infiltration Left, radial was placed Catheter size: 20 G Hand hygiene performed  and maximum sterile barriers used   Attempts: 1 Procedure performed without using ultrasound guided technique. Following insertion, dressing applied and Biopatch. Post procedure assessment: normal and unchanged  Patient tolerated the procedure well with no immediate complications.

## 2023-09-24 ENCOUNTER — Inpatient Hospital Stay (HOSPITAL_COMMUNITY): Payer: 59

## 2023-09-24 LAB — CBC
HCT: 33.6 % — ABNORMAL LOW (ref 39.0–52.0)
HCT: 33.4 % — ABNORMAL LOW (ref 39.0–52.0)
HCT: 32.3 % — ABNORMAL LOW (ref 39.0–52.0)
Hemoglobin: 10.9 g/dL — ABNORMAL LOW (ref 13.0–17.0)
Hemoglobin: 10.8 g/dL — ABNORMAL LOW (ref 13.0–17.0)
Hemoglobin: 10.4 g/dL — ABNORMAL LOW (ref 13.0–17.0)
MCH: 26.4 pg (ref 26.0–34.0)
MCH: 26.3 pg (ref 26.0–34.0)
MCH: 26.2 pg (ref 26.0–34.0)
MCHC: 32.4 g/dL (ref 30.0–36.0)
MCHC: 32.3 g/dL (ref 30.0–36.0)
MCHC: 32.2 g/dL (ref 30.0–36.0)
MCV: 81.4 fL (ref 80.0–100.0)
MCV: 81.3 fL (ref 80.0–100.0)
MCV: 81.4 fL (ref 80.0–100.0)
Platelets: 181 10*3/uL (ref 150–400)
Platelets: 167 10*3/uL (ref 150–400)
Platelets: 149 10*3/uL — ABNORMAL LOW (ref 150–400)
RBC: 4.13 MIL/uL — ABNORMAL LOW (ref 4.22–5.81)
RBC: 4.11 MIL/uL — ABNORMAL LOW (ref 4.22–5.81)
RBC: 3.97 MIL/uL — ABNORMAL LOW (ref 4.22–5.81)
RDW: 13.7 % (ref 11.5–15.5)
RDW: 13.8 % (ref 11.5–15.5)
RDW: 13.8 % (ref 11.5–15.5)
WBC: 12.8 10*3/uL — ABNORMAL HIGH (ref 4.0–10.5)
WBC: 12 10*3/uL — ABNORMAL HIGH (ref 4.0–10.5)
WBC: 13 10*3/uL — ABNORMAL HIGH (ref 4.0–10.5)
nRBC: 0 % (ref 0.0–0.2)
nRBC: 0 % (ref 0.0–0.2)
nRBC: 0 % (ref 0.0–0.2)

## 2023-09-24 LAB — GLUCOSE, CAPILLARY
Glucose-Capillary: 108 mg/dL — ABNORMAL HIGH (ref 70–99)
Glucose-Capillary: 109 mg/dL — ABNORMAL HIGH (ref 70–99)
Glucose-Capillary: 112 mg/dL — ABNORMAL HIGH (ref 70–99)
Glucose-Capillary: 112 mg/dL — ABNORMAL HIGH (ref 70–99)
Glucose-Capillary: 116 mg/dL — ABNORMAL HIGH (ref 70–99)
Glucose-Capillary: 120 mg/dL — ABNORMAL HIGH (ref 70–99)
Glucose-Capillary: 135 mg/dL — ABNORMAL HIGH (ref 70–99)
Glucose-Capillary: 137 mg/dL — ABNORMAL HIGH (ref 70–99)
Glucose-Capillary: 143 mg/dL — ABNORMAL HIGH (ref 70–99)
Glucose-Capillary: 145 mg/dL — ABNORMAL HIGH (ref 70–99)
Glucose-Capillary: 148 mg/dL — ABNORMAL HIGH (ref 70–99)

## 2023-09-24 LAB — BASIC METABOLIC PANEL
Anion gap: 12 (ref 5–15)
Anion gap: 10 (ref 5–15)
BUN: 23 mg/dL — ABNORMAL HIGH (ref 6–20)
BUN: 25 mg/dL — ABNORMAL HIGH (ref 6–20)
CO2: 21 mmol/L — ABNORMAL LOW (ref 22–32)
CO2: 22 mmol/L (ref 22–32)
Calcium: 8.2 mg/dL — ABNORMAL LOW (ref 8.9–10.3)
Calcium: 8.5 mg/dL — ABNORMAL LOW (ref 8.9–10.3)
Chloride: 100 mmol/L (ref 98–111)
Chloride: 99 mmol/L (ref 98–111)
Creatinine, Ser: 1.78 mg/dL — ABNORMAL HIGH (ref 0.61–1.24)
Creatinine, Ser: 1.9 mg/dL — ABNORMAL HIGH (ref 0.61–1.24)
GFR, Estimated: 43 mL/min — ABNORMAL LOW (ref >60)
GFR, Estimated: 40 mL/min — ABNORMAL LOW (ref >60)
Glucose, Bld: 144 mg/dL — ABNORMAL HIGH (ref 70–99)
Glucose, Bld: 111 mg/dL — ABNORMAL HIGH (ref 70–99)
Potassium: 3.7 mmol/L (ref 3.5–5.1)
Potassium: 4.3 mmol/L (ref 3.5–5.1)
Sodium: 133 mmol/L — ABNORMAL LOW (ref 135–145)
Sodium: 131 mmol/L — ABNORMAL LOW (ref 135–145)

## 2023-09-24 LAB — MAGNESIUM
Magnesium: 2.7 mg/dL — ABNORMAL HIGH (ref 1.7–2.4)
Magnesium: 2.6 mg/dL — ABNORMAL HIGH (ref 1.7–2.4)

## 2023-09-24 LAB — CREATININE, SERUM
Creatinine, Ser: 0.73 mg/dL (ref 0.61–1.24)
GFR, Estimated: >60 mL/min (ref >60)

## 2023-09-24 MED ORDER — FUROSEMIDE 10 MG/ML IJ SOLN
20.0000 mg | Freq: Two times a day (BID) | INTRAMUSCULAR | Status: DC
  Administered 2023-09-24 (×2): 20 mg via INTRAVENOUS
  Filled 2023-09-24 (×2): qty 2

## 2023-09-24 MED ORDER — POTASSIUM CHLORIDE CRYS ER 20 MEQ PO TBCR
20.0000 meq | EXTENDED_RELEASE_TABLET | ORAL | Status: AC
  Administered 2023-09-24 (×3): 20 meq via ORAL
  Filled 2023-09-24 (×3): qty 1

## 2023-09-24 MED ORDER — ALBUMIN HUMAN 5 % IV SOLN
12.5000 g | Freq: Once | INTRAVENOUS | Status: AC
  Administered 2023-09-24: 12.5 g via INTRAVENOUS
  Filled 2023-09-24: qty 250

## 2023-09-24 MED ORDER — INSULIN ASPART 100 UNIT/ML IJ SOLN
0.0000 [IU] | INTRAMUSCULAR | Status: DC
  Administered 2023-09-24: 2 [IU] via SUBCUTANEOUS

## 2023-09-24 MED ORDER — COLCHICINE 0.3 MG HALF TABLET
0.3000 mg | ORAL_TABLET | Freq: Every day | ORAL | Status: DC
  Administered 2023-09-24 – 2023-09-28 (×5): 0.3 mg via ORAL
  Filled 2023-09-24 (×6): qty 1

## 2023-09-24 MED ORDER — ENOXAPARIN SODIUM 40 MG/0.4ML IJ SOSY
40.0000 mg | PREFILLED_SYRINGE | Freq: Every day | INTRAMUSCULAR | Status: DC
  Administered 2023-09-24 – 2023-09-27 (×4): 40 mg via SUBCUTANEOUS
  Filled 2023-09-24 (×4): qty 0.4

## 2023-09-24 NOTE — Plan of Care (Signed)

## 2023-09-24 NOTE — Plan of Care (Signed)

## 2023-09-24 NOTE — Progress Notes (Signed)
1 Day Post-Op Procedure(s) (LRB): AORTIC VALVE REPLACEMENT (AVR) USING INSPIRIS RESILIA AORTIC VALVE SIZE 27 MM (N/A) TRANSESOPHAGEAL ECHOCARDIOGRAM (TEE) (N/A) Subjective: C/o pain with deep breathing  Objective: Vital signs in last 24 hours: Temp:  [97 F (36.1 C)-99.3 F (37.4 C)] 99.1 F (37.3 C) (02/15 0800) Pulse Rate:  [70-91] 81 (02/15 0800) Resp:  [14-27] 27 (02/15 0800) BP: (87-154)/(56-126) 151/126 (02/15 0800) SpO2:  [92 %-100 %] 98 % (02/15 0800) Arterial Line BP: (94-171)/(55-94) 149/86 (02/15 0800) FiO2 (%):  [40 %-50 %] 40 % (02/14 1545) Weight:  [105.9 kg] 105.9 kg (02/15 0500)  Hemodynamic parameters for last 24 hours: PAP: (22-45)/(6-30) 36/22 CO:  [2.8 L/min-5.5 L/min] 4 L/min CI:  [1.32 L/min/m2-2.58 L/min/m2] 1.9 L/min/m2  Intake/Output from previous day: 02/14 0701 - 02/15 0700 In: 3770.8 [P.O.:120; I.V.:1559.5; Blood:485; NG/GT:60; IV Piggyback:1546.3] Out: 2730 [Urine:2370; Chest Tube:360] Intake/Output this shift: Total I/O In: -  Out: 75 [Urine:75]  General appearance: alert, cooperative, and no distress Neurologic: intact Heart: regular rate and rhythm and + rub Lungs: diminished breath sounds bibasilar Abdomen: normal findings: soft, non-tender  Lab Results: Recent Labs    09/23/23 1726 09/23/23 1734 09/24/23 0205  WBC 12.9*  --  12.8*  HGB 11.0* 11.6* 10.9*  HCT 34.5* 34.0* 33.6*  PLT 206  --  181   BMET:  Recent Labs    09/23/23 1726 09/23/23 1734 09/24/23 0205  NA 135 138 133*  K 3.8 3.8 3.7  CL 102  --  100  CO2 21*  --  21*  GLUCOSE 145*  --  144*  BUN 21*  --  23*  CREATININE 1.83*  --  1.78*  CALCIUM 8.1*  --  8.2*    PT/INR:  Recent Labs    09/23/23 1148  LABPROT 18.0*  INR 1.5*   ABG    Component Value Date/Time   PHART 7.387 09/23/2023 1734   HCO3 22.6 09/23/2023 1734   TCO2 24 09/23/2023 1734   ACIDBASEDEF 2.0 09/23/2023 1734   O2SAT 97 09/23/2023 1734   CBG (last 3)  Recent Labs     09/24/23 0406 09/24/23 0621 09/24/23 0828  GLUCAP 120* 143* 116*    Assessment/Plan: S/P Procedure(s) (LRB): AORTIC VALVE REPLACEMENT (AVR) USING INSPIRIS RESILIA AORTIC VALVE SIZE 27 MM (N/A) TRANSESOPHAGEAL ECHOCARDIOGRAM (TEE) (N/A) POD # 1 NEURO- intact CV- in SR  CI 1.9, low filling pressures- albumin this AM  Dc Swan and A line  ECG reviewed with Dr. Shirlee Latch- probable pericarditis  Fits with clinical picture whereas STEMI does not  ASA, beta blocker RESP- IS for atelectasis RENAL- creatinine 1.78- near baseline- monitor  K 3.7  Diurese ENDO- CBG mildly elevated  Dc insulin drip   SSI GI- advance diet as tolerated Anemia secondary to ABL- mild, follow SCD + enoxaparin for DVT prophylaxis Dc chest tubes Mobilize    LOS: 1 day    Carlos Hayes 09/24/2023

## 2023-09-24 NOTE — Progress Notes (Signed)
      [address]       [place] [address]             [phone]      POD # 1 AVR  Up in chair  BP (!) 146/83   Pulse 90   Temp 98.8 F (37.1 C)   Resp 18   Ht 5\' 7"  (1.702 m)   Wt 105.9 kg   SpO2 97%   BMI 36.57 kg/m   Intake/Output Summary (Last 24 hours) at 09/24/2023 1734 Last data filed at 09/24/2023 1630 Gross per 24 hour  Intake 1321.5 ml  Output 1905 ml  Net -583.5 ml   Creatinine 1.9 up slightly from 1.8 as expected K 4.3 Hct 32  Doing well POD # 1  Fines Kimberlin C. Dorris Fetch, MD [facility] [phone]

## 2023-09-25 ENCOUNTER — Inpatient Hospital Stay (HOSPITAL_COMMUNITY): Payer: 59

## 2023-09-25 LAB — CBC
HCT: 31.2 % — ABNORMAL LOW (ref 39.0–52.0)
Hemoglobin: 9.9 g/dL — ABNORMAL LOW (ref 13.0–17.0)
MCH: 26.3 pg (ref 26.0–34.0)
MCHC: 31.7 g/dL (ref 30.0–36.0)
MCV: 82.8 fL (ref 80.0–100.0)
Platelets: 137 10*3/uL — ABNORMAL LOW (ref 150–400)
RBC: 3.77 MIL/uL — ABNORMAL LOW (ref 4.22–5.81)
RDW: 13.8 % (ref 11.5–15.5)
WBC: 11.2 10*3/uL — ABNORMAL HIGH (ref 4.0–10.5)
nRBC: 0 % (ref 0.0–0.2)

## 2023-09-25 LAB — GLUCOSE, CAPILLARY
Glucose-Capillary: 108 mg/dL — ABNORMAL HIGH (ref 70–99)
Glucose-Capillary: 149 mg/dL — ABNORMAL HIGH (ref 70–99)
Glucose-Capillary: 143 mg/dL — ABNORMAL HIGH (ref 70–99)
Glucose-Capillary: 105 mg/dL — ABNORMAL HIGH (ref 70–99)
Glucose-Capillary: 129 mg/dL — ABNORMAL HIGH (ref 70–99)

## 2023-09-25 LAB — BASIC METABOLIC PANEL
Anion gap: 11 (ref 5–15)
BUN: 27 mg/dL — ABNORMAL HIGH (ref 6–20)
CO2: 24 mmol/L (ref 22–32)
Calcium: 8.5 mg/dL — ABNORMAL LOW (ref 8.9–10.3)
Chloride: 98 mmol/L (ref 98–111)
Creatinine, Ser: 1.92 mg/dL — ABNORMAL HIGH (ref 0.61–1.24)
GFR, Estimated: 39 mL/min — ABNORMAL LOW (ref >60)
Glucose, Bld: 111 mg/dL — ABNORMAL HIGH (ref 70–99)
Potassium: 4.1 mmol/L (ref 3.5–5.1)
Sodium: 133 mmol/L — ABNORMAL LOW (ref 135–145)

## 2023-09-25 MED ORDER — ALUM & MAG HYDROXIDE-SIMETH 200-200-20 MG/5ML PO SUSP: 15.0000 mL | Freq: Four times a day (QID) | ORAL | Status: DC | PRN

## 2023-09-25 MED ORDER — FUROSEMIDE 40 MG PO TABS
40.0000 mg | ORAL_TABLET | Freq: Every day | ORAL | Status: DC
  Administered 2023-09-25: 40 mg via ORAL
  Filled 2023-09-25: qty 1

## 2023-09-25 MED ORDER — SODIUM CHLORIDE 0.9% FLUSH: 3.0000 mL | INTRAVENOUS | Status: DC | PRN

## 2023-09-25 MED ORDER — INSULIN ASPART 100 UNIT/ML IJ SOLN
0.0000 [IU] | Freq: Three times a day (TID) | INTRAMUSCULAR | Status: DC
  Administered 2023-09-25 (×2): 2 [IU] via SUBCUTANEOUS

## 2023-09-25 MED ORDER — MAGNESIUM HYDROXIDE 400 MG/5ML PO SUSP: 30.0000 mL | Freq: Every day | ORAL | Status: DC | PRN

## 2023-09-25 MED ORDER — ZOLPIDEM TARTRATE 5 MG PO TABS: 5.0000 mg | ORAL_TABLET | Freq: Every evening | ORAL | Status: DC | PRN

## 2023-09-25 MED ORDER — SODIUM CHLORIDE 0.9% FLUSH
3.0000 mL | Freq: Two times a day (BID) | INTRAVENOUS | Status: DC
  Administered 2023-09-25 – 2023-09-28 (×6): 3 mL via INTRAVENOUS

## 2023-09-25 MED ORDER — SODIUM CHLORIDE 0.9 % IV SOLN: 250.0000 mL | INTRAVENOUS | Status: AC | PRN

## 2023-09-25 MED ORDER — METOPROLOL SUCCINATE ER 25 MG PO TB24
25.0000 mg | ORAL_TABLET | Freq: Every day | ORAL | Status: DC
  Administered 2023-09-25: 25 mg via ORAL
  Filled 2023-09-25: qty 1

## 2023-09-25 MED ORDER — ~~LOC~~ CARDIAC SURGERY, PATIENT & FAMILY EDUCATION: Freq: Once | Status: AC

## 2023-09-25 NOTE — Progress Notes (Signed)
2 Days Post-Op Procedure(s) (LRB): AORTIC VALVE REPLACEMENT (AVR) USING INSPIRIS RESILIA AORTIC VALVE SIZE 27 MM (N/A) TRANSESOPHAGEAL ECHOCARDIOGRAM (TEE) (N/A) Subjective: No complaints, ambulated around unit this AM, denies pain and nausea  Objective: Vital signs in last 24 hours: Temp:  [98.5 F (36.9 C)-99.3 F (37.4 C)] 98.5 F (36.9 C) (02/16 0808) Pulse Rate:  [75-91] 84 (02/16 0800) Cardiac Rhythm: Normal sinus rhythm (02/15 2000) Resp:  [11-28] 21 (02/16 0800) BP: (105-146)/(68-93) 117/79 (02/16 0800) SpO2:  [91 %-98 %] 95 % (02/16 0800) Arterial Line BP: (135-164)/(64-90) 140/66 (02/15 1600) Weight:  [105.9 kg] 105.9 kg (02/16 0605)  Hemodynamic parameters for last 24 hours: PAP: (27-31)/(14-15) 31/15  Intake/Output from previous day: 02/15 0701 - 02/16 0700 In: 594.4 [I.V.:140.5; IV Piggyback:454] Out: 3075 [Urine:3035; Chest Tube:40] Intake/Output this shift: Total I/O In: 125.4 [IV Piggyback:125.4] Out: -   General appearance: alert, cooperative, and no distress Neurologic: intact Heart: regular rate and rhythm Lungs: diminished breath sounds bibasilar Abdomen: normal findings: soft, non-tender  Lab Results: Recent Labs    09/24/23 1604 09/25/23 0444  WBC 13.0* 11.2*  HGB 10.4* 9.9*  HCT 32.3* 31.2*  PLT 149* 137*   BMET:  Recent Labs    09/24/23 1604 09/25/23 0444  NA 131* 133*  K 4.3 4.1  CL 99 98  CO2 22 24  GLUCOSE 111* 111*  BUN 25* 27*  CREATININE 1.90* 1.92*  CALCIUM 8.5* 8.5*    PT/INR:  Recent Labs    09/23/23 1148  LABPROT 18.0*  INR 1.5*   ABG    Component Value Date/Time   PHART 7.387 09/23/2023 1734   HCO3 22.6 09/23/2023 1734   TCO2 24 09/23/2023 1734   ACIDBASEDEF 2.0 09/23/2023 1734   O2SAT 97 09/23/2023 1734   CBG (last 3)  Recent Labs    09/24/23 2005 09/24/23 2305 09/25/23 0448  GLUCAP 135* 109* 108*    Assessment/Plan: S/P Procedure(s) (LRB): AORTIC VALVE REPLACEMENT (AVR) USING INSPIRIS RESILIA  AORTIC VALVE SIZE 27 MM (N/A) TRANSESOPHAGEAL ECHOCARDIOGRAM (TEE) (N/A) Plan for transfer to step-down: see transfer orders POD # 2 Doing well NEURO- intact CV- in SR, BP normal  ASA, statin, beta blocker  Change Lopressor to Toprol 25 mg daily  Hold ARB until BP higher  Dc pacing wires RESP- continue IS for basilar atelectasis RENAL- creatinine stable at 1.9, lytes OK  Diurese, dc Foley ENDO- CBG well controlled  Change SSI to [facility] and [facility] GI- tolerating diet SCD + enoxaparin for DVT prophylaxis DC Central line   LOS: 2 days    Loreli Slot 09/25/2023

## 2023-09-25 NOTE — Progress Notes (Signed)
      [address]       [place] [address]             [phone]      POD # 2  Resting comfortably  BP 113/70   Pulse 94   Temp 98.5 F (36.9 C) (Oral)   Resp 19   Ht 5\' 7"  (1.702 m)   Wt 105.9 kg   SpO2 94%   BMI 36.57 kg/m   Intake/Output Summary (Last 24 hours) at 09/25/2023 1834 Last data filed at 09/25/2023 1757 Gross per 24 hour  Intake 733.77 ml  Output 3485 ml  Net -2751.23 ml   Awaiting bed on 4E  Breckon Reeves C. Dorris Fetch, MD [facility] [phone]

## 2023-09-26 ENCOUNTER — Inpatient Hospital Stay (HOSPITAL_COMMUNITY): Payer: 59

## 2023-09-26 ENCOUNTER — Encounter (HOSPITAL_COMMUNITY): Payer: Self-pay | Admitting: Surgery

## 2023-09-26 LAB — CBC
HCT: 32.4 % — ABNORMAL LOW (ref 39.0–52.0)
Hemoglobin: 10.5 g/dL — ABNORMAL LOW (ref 13.0–17.0)
MCH: 26.5 pg (ref 26.0–34.0)
MCHC: 32.4 g/dL (ref 30.0–36.0)
MCV: 81.8 fL (ref 80.0–100.0)
Platelets: 147 10*3/uL — ABNORMAL LOW (ref 150–400)
RBC: 3.96 MIL/uL — ABNORMAL LOW (ref 4.22–5.81)
RDW: 13.7 % (ref 11.5–15.5)
WBC: 11.7 10*3/uL — ABNORMAL HIGH (ref 4.0–10.5)
nRBC: 0 % (ref 0.0–0.2)

## 2023-09-26 LAB — BASIC METABOLIC PANEL
Anion gap: 13 (ref 5–15)
BUN: 24 mg/dL — ABNORMAL HIGH (ref 6–20)
CO2: 24 mmol/L (ref 22–32)
Calcium: 9.1 mg/dL (ref 8.9–10.3)
Chloride: 100 mmol/L (ref 98–111)
Creatinine, Ser: 1.6 mg/dL — ABNORMAL HIGH (ref 0.61–1.24)
GFR, Estimated: 49 mL/min — ABNORMAL LOW (ref >60)
Glucose, Bld: 108 mg/dL — ABNORMAL HIGH (ref 70–99)
Potassium: 3.8 mmol/L (ref 3.5–5.1)
Sodium: 137 mmol/L (ref 135–145)

## 2023-09-26 LAB — GLUCOSE, CAPILLARY
Glucose-Capillary: 100 mg/dL — ABNORMAL HIGH (ref 70–99)
Glucose-Capillary: 160 mg/dL — ABNORMAL HIGH (ref 70–99)

## 2023-09-26 LAB — SURGICAL PATHOLOGY

## 2023-09-26 MED ORDER — METOPROLOL SUCCINATE ER 50 MG PO TB24
50.0000 mg | ORAL_TABLET | Freq: Every day | ORAL | Status: DC
  Administered 2023-09-26 – 2023-09-28 (×3): 50 mg via ORAL
  Filled 2023-09-26 (×3): qty 1

## 2023-09-26 MED FILL — Heparin Sodium (Porcine) Inj 1000 Unit/ML: Qty: 1000 | Status: AC

## 2023-09-26 MED FILL — Lidocaine HCl Local Preservative Free (PF) Inj 2%: INTRAMUSCULAR | Qty: 14 | Status: AC

## 2023-09-26 MED FILL — Potassium Chloride Inj 2 mEq/ML: INTRAVENOUS | Qty: 40 | Status: AC

## 2023-09-26 NOTE — Progress Notes (Signed)
Mobility Specialist Progress Note:    09/26/23 1432  Mobility  Activity Ambulated with assistance to bathroom;Ambulated with assistance in room  Level of Assistance Standby assist, set-up cues, supervision of patient - no hands on  Assistive Device None  Distance Ambulated (ft) 12 ft  RUE Weight Bearing Per Provider Order NWB  LUE Weight Bearing Per Provider Order NWB  Activity Response Tolerated well  Mobility Referral Yes  Mobility visit 1 Mobility  Mobility Specialist Start Time (ACUTE ONLY) 1400  Mobility Specialist Stop Time (ACUTE ONLY) 1410  Mobility Specialist Time Calculation (min) (ACUTE ONLY) 10 min   Responded to pt's bathroom call light. Pt independently ambulated from bathroom back to bed, SV for safety. Tolerated well, asx throughout. HR 98 bpm. Family at bedside, all needs met.    Feliciana Rossetti Mobility Specialist Please contact via Special educational needs teacher or  Rehab office at [phone]

## 2023-09-26 NOTE — Progress Notes (Signed)
3 Days Post-Op Procedure(s) (LRB): AORTIC VALVE REPLACEMENT (AVR) USING INSPIRIS RESILIA AORTIC VALVE SIZE 27 MM (N/A) TRANSESOPHAGEAL ECHOCARDIOGRAM (TEE) (N/A) Subjective: No complaints  Objective: Vital signs in last 24 hours: Temp:  [98.5 F (36.9 C)-98.6 F (37 C)] 98.6 F (37 C) (02/17 0400) Pulse Rate:  [83-100] 100 (02/17 0600) Cardiac Rhythm: Normal sinus rhythm (02/16 2000) Resp:  [13-32] 19 (02/17 0600) BP: (113-159)/(70-109) 128/95 (02/17 0600) SpO2:  [90 %-98 %] 95 % (02/17 0600) Weight:  [102.9 kg] 102.9 kg (02/17 0600)  Hemodynamic parameters for last 24 hours:    Intake/Output from previous day: 02/16 0701 - 02/17 0700 In: 848.4 [P.O.:720; I.V.:3; IV Piggyback:125.4] Out: 2450 [Urine:2450] Intake/Output this shift: No intake/output data recorded.  General appearance: alert and cooperative Neurologic: intact Heart: regular rate and rhythm Lungs: clear to auscultation bilaterally Extremities: no edema Wound: incision ok  Lab Results: Recent Labs    09/25/23 0444 09/26/23 0431  WBC 11.2* 11.7*  HGB 9.9* 10.5*  HCT 31.2* 32.4*  PLT 137* 147*   BMET:  Recent Labs    09/25/23 0444 09/26/23 0431  NA 133* 137  K 4.1 3.8  CL 98 100  CO2 24 24  GLUCOSE 111* 108*  BUN 27* 24*  CREATININE 1.92* 1.60*  CALCIUM 8.5* 9.1    PT/INR:  Recent Labs    09/23/23 1148  LABPROT 18.0*  INR 1.5*   ABG    Component Value Date/Time   PHART 7.387 09/23/2023 1734   HCO3 22.6 09/23/2023 1734   TCO2 24 09/23/2023 1734   ACIDBASEDEF 2.0 09/23/2023 1734   O2SAT 97 09/23/2023 1734   CBG (last 3)  Recent Labs    09/25/23 0915 09/25/23 1132 09/25/23 1642  GLUCAP 143* 105* 129*   CXR: clear  Assessment/Plan: S/P Procedure(s) (LRB): AORTIC VALVE REPLACEMENT (AVR) USING INSPIRIS RESILIA AORTIC VALVE SIZE 27 MM (N/A) TRANSESOPHAGEAL ECHOCARDIOGRAM (TEE) (N/A)  POD 3 Hemodynamically stable. Mild resting sinus tach. Will increase Toprol to 50.  Wt  is below preop. Can hold off on further diuresis.  Creat is at baseline.  Glucose under good  control and no hx of DM. Will DC CBG's and SSI.  Waiting on 4E best.  Continue IS, ambulation.   LOS: 3 days    Alleen Borne 09/26/2023

## 2023-09-26 NOTE — Anesthesia Postprocedure Evaluation (Signed)
Anesthesia Post Note  Patient: Carlos Hayes  Procedure(s) Performed: AORTIC VALVE REPLACEMENT (AVR) USING INSPIRIS RESILIA AORTIC VALVE SIZE 27 MM (Chest) TRANSESOPHAGEAL ECHOCARDIOGRAM (TEE)     Patient location during evaluation: ICU Anesthesia Type: General Level of consciousness: sedated and patient remains intubated per anesthesia plan Pain management: pain level controlled Vital Signs Assessment: post-procedure vital signs reviewed and stable Respiratory status: patient remains intubated per anesthesia plan Cardiovascular status: stable Anesthetic complications: no   No notable events documented.  Last Vitals:  Vitals:   09/26/23 0758 09/26/23 0917  BP:  118/78  Pulse:  93  Resp:  14  Temp: 37 C 37.1 C  SpO2:  93%    Last Pain:  Vitals:   09/26/23 0917  TempSrc: Oral  PainSc:                  Lewie Loron

## 2023-09-26 NOTE — Progress Notes (Signed)
CARDIAC REHAB PHASE I    Pt resting in bed. Feeling well today. Ambulated this am once, tolerated well. Transfer to 4E short time ago. Post OHS education including site care, restrictions, heart healthy diet, sternal precautions, IS use at home, home needs at discharge, exercise guidelines and CRP2 reviewed. All questions and concerns addressed. Will refer to [facility] for CRP2. Will continue to follow.  [phone] Woodroe Chen, RN BSN 09/26/2023 10:43 AM

## 2023-09-26 NOTE — Progress Notes (Signed)
   09/26/23 0917  Vitals  Temp 98.7 F (37.1 C)  Temp Source Oral  BP 118/78  MAP (mmHg) 91  BP Location Right Arm  BP Method Automatic  Patient Position (if appropriate) Lying  Pulse Rate 93  ECG Heart Rate 92  Resp 14  MEWS COLOR  MEWS Score Color Green  Oxygen Therapy  SpO2 93 %  O2 Device Room Air  MEWS Score  MEWS Temp 0  MEWS Systolic 0  MEWS Pulse 0  MEWS RR 0  MEWS LOC 0  MEWS Score 0   Patient arrived to 4E10 from 2H, patient placed on monitor and vital signs made completed.  CCMD made aware, bedside RN aware. Henriette Hesser, Randall An RN

## 2023-09-26 NOTE — Plan of Care (Signed)

## 2023-09-26 NOTE — TOC Initial Note (Signed)
Transition of Care [facility]) - Initial/Assessment Note    Patient Details  Name: Carlos Hayes MRN: [id number] Date of Birth: 28-Dec-1960  Transition of Care [facility]) CM/SW Contact:    Ronny Bacon, RN Phone Number: 09/26/2023, 1:23 PM  Clinical Narrative:    Patient from home alone. Presented with c/o fatigue, had AVR done on 09/23/23. No TOC needs noted at this time.               Expected Discharge Plan: Home/Self Care Barriers to Discharge: Continued Medical Work up   Patient Goals and CMS Choice            Expected Discharge Plan and Services       Living arrangements for the past 2 months: Single Family Home                                      Prior Living Arrangements/Services Living arrangements for the past 2 months: Single Family Home Lives with:: Self Patient language and need for interpreter reviewed:: Yes              Criminal Activity/Legal Involvement Pertinent to Current Situation/Hospitalization: No - Comment as needed  Activities of Daily Living   ADL Screening (condition at time of admission) Independently performs ADLs?: Yes (appropriate for developmental age) Is the patient deaf or have difficulty hearing?: No Does the patient have difficulty seeing, even when wearing glasses/contacts?: No Does the patient have difficulty concentrating, remembering, or making decisions?: No  Permission Sought/Granted                  Emotional Assessment         Alcohol / Substance Use: Not Applicable Psych Involvement: No (comment)  Admission diagnosis:  S/P aortic valve replacement with bioprosthetic valve [Z95.3] Patient Active Problem List   Diagnosis Date Noted   S/P aortic valve replacement with bioprosthetic valve 09/23/2023   Coronary artery disease 06/28/2023   Nonrheumatic aortic valve insufficiency 09/15/2022   Morbid obesity (HCC) 09/15/2022   Stage 3 chronic kidney disease (HCC) 09/15/2022   Essential hypertension  01/29/2021   Prediabetes 01/29/2021   Hyperlipidemia 01/29/2021   TIA (transient ischemic attack) 09/26/2020   PCP:  Ivonne Andrew, NP Pharmacy:   [facility] [phone] Ginette Otto,  - [address] [place] [address] Phone: [phone] Fax: [phone]     Social Drivers of Health (SDOH) Social History: SDOH Screenings   Food Insecurity: No Food Insecurity (09/23/2023)  Housing: Low Risk  (09/23/2023)  Transportation Needs: No Transportation Needs (09/23/2023)  Utilities: Not At Risk (09/23/2023)  Depression (PHQ2-9): Low Risk  (07/08/2022)  Tobacco Use: Low Risk  (09/23/2023)   SDOH Interventions:     Readmission Risk Interventions     No data to display

## 2023-09-27 MED FILL — Thrombin (Recombinant) For Soln 20000 Unit: CUTANEOUS | Qty: 1 | Status: AC

## 2023-09-27 NOTE — Progress Notes (Signed)
4 Days Post-Op Procedure(s) (LRB): AORTIC VALVE REPLACEMENT (AVR) USING INSPIRIS RESILIA AORTIC VALVE SIZE 27 MM (N/A) TRANSESOPHAGEAL ECHOCARDIOGRAM (TEE) (N/A) Subjective: No complaints. Nurses put oxygen back on him overnight for decreased sats per family but it was while he was sleeping. 99% on 2L this am.  Objective: Vital signs in last 24 hours: Temp:  [98 F (36.7 C)-99.1 F (37.3 C)] 99.1 F (37.3 C) (02/18 0354) Pulse Rate:  [84-101] 93 (02/18 0525) Cardiac Rhythm: Normal sinus rhythm (02/17 1900) Resp:  [13-32] 19 (02/18 0525) BP: (109-139)/(63-86) 112/70 (02/18 0354) SpO2:  [91 %-100 %] 99 % (02/18 0525) Weight:  [102.7 kg] 102.7 kg (02/18 0100)  Hemodynamic parameters for last 24 hours:    Intake/Output from previous day: 02/17 0701 - 02/18 0700 In: 240 [P.O.:240] Out: 2300 [Urine:2300] Intake/Output this shift: No intake/output data recorded.  General appearance: alert and cooperative Neurologic: intact Heart: regular rate and rhythm Lungs: clear to auscultation bilaterally Extremities: no edema Wound: incision healing well  Lab Results: Recent Labs    09/25/23 0444 09/26/23 0431  WBC 11.2* 11.7*  HGB 9.9* 10.5*  HCT 31.2* 32.4*  PLT 137* 147*   BMET:  Recent Labs    09/25/23 0444 09/26/23 0431  NA 133* 137  K 4.1 3.8  CL 98 100  CO2 24 24  GLUCOSE 111* 108*  BUN 27* 24*  CREATININE 1.92* 1.60*  CALCIUM 8.5* 9.1    PT/INR: No results for input(s): "LABPROT", "INR" in the last 72 hours. ABG    Component Value Date/Time   PHART 7.387 09/23/2023 1734   HCO3 22.6 09/23/2023 1734   TCO2 24 09/23/2023 1734   ACIDBASEDEF 2.0 09/23/2023 1734   O2SAT 97 09/23/2023 1734   CBG (last 3)  Recent Labs    09/25/23 1642 09/25/23 2130 09/26/23 0756  GLUCAP 129* 100* 160*    Assessment/Plan: S/P Procedure(s) (LRB): AORTIC VALVE REPLACEMENT (AVR) USING INSPIRIS RESILIA AORTIC VALVE SIZE 27 MM (N/A) TRANSESOPHAGEAL ECHOCARDIOGRAM (TEE)  (N/A)  POD 4 Hemodynamically stable in sinus rhythm. Continue Toprol 50. Hold off on resuming Norvasc and Avapro until we see him back.  Wt is at preop.  Continue IS, ambulation.   Plan home in am if no changes.   LOS: 4 days    Alleen Borne 09/27/2023

## 2023-09-27 NOTE — Progress Notes (Signed)
Mobility Specialist Progress Note:    09/27/23 1207  Mobility  Activity Ambulated with assistance to bathroom  Level of Assistance Standby assist, set-up cues, supervision of patient - no hands on  Assistive Device None  Distance Ambulated (ft) 12 ft  RUE Weight Bearing Per Provider Order NWB  LUE Weight Bearing Per Provider Order NWB  Activity Response Tolerated well  Mobility Referral Yes  Mobility visit 1 Mobility  Mobility Specialist Start Time (ACUTE ONLY) 1200  Mobility Specialist Stop Time (ACUTE ONLY) 1207  Mobility Specialist Time Calculation (min) (ACUTE ONLY) 7 min   Pt requested assistance to bathroom. Declined to ambulate in hallway at this time d/t lunch arriving. No AD required to ambulate to bathroom, SBA for safety. Tolerated well, left pt in bathroom, family member in room. Encouraged to use call light for assistance.    Feliciana Rossetti Mobility Specialist Please contact via Special educational needs teacher or  Rehab office at [phone]

## 2023-09-27 NOTE — Progress Notes (Signed)
CARDIAC REHAB PHASE I   Pt sitting in chair, feeling well today. Ambulating using front wheel walker for added balance, reports progressing well. Post OHS education competed. No questions or concerns. Referral for CRP2 sent to [facility].    Woodroe Chen, RN BSN 09/27/2023 2:14 PM

## 2023-09-27 NOTE — Progress Notes (Signed)
Mobility Specialist Progress Note:    09/27/23 1426  Mobility  Activity Ambulated with assistance in hallway  Level of Assistance Contact guard assist, steadying assist  Assistive Device None  Distance Ambulated (ft) 940 ft  RUE Weight Bearing Per Provider Order NWB  LUE Weight Bearing Per Provider Order NWB  Activity Response Tolerated well  Mobility Referral Yes  Mobility visit 1 Mobility  Mobility Specialist Start Time (ACUTE ONLY) 1415  Mobility Specialist Stop Time (ACUTE ONLY) 1425  Mobility Specialist Time Calculation (min) (ACUTE ONLY) 10 min   Pt received ambulating in room, eager for mobility session. Ambulated in hallway with CGA, no AD required. Sternal precautions followed. Max HR 110 bpm. Returned pt to room, son at bedside, all needs met.   Carlos Hayes Mobility Specialist Please contact via Special educational needs teacher or  Rehab office at [phone]

## 2023-09-28 ENCOUNTER — Other Ambulatory Visit (HOSPITAL_COMMUNITY): Payer: Self-pay

## 2023-09-28 MED ORDER — ASPIRIN 325 MG PO TBEC
325.0000 mg | DELAYED_RELEASE_TABLET | Freq: Every day | ORAL | 3 refills | Status: AC
  Filled 2023-09-28: qty 100, 100d supply, fill #0

## 2023-09-28 MED ORDER — METOPROLOL SUCCINATE ER 50 MG PO TB24
50.0000 mg | ORAL_TABLET | Freq: Every day | ORAL | 2 refills | Status: AC
  Filled 2023-09-28: qty 30, 30d supply, fill #0

## 2023-09-28 MED ORDER — ACETAMINOPHEN 325 MG PO TABS: 650.0000 mg | ORAL_TABLET | Freq: Four times a day (QID) | ORAL | Status: AC

## 2023-09-28 MED ORDER — OXYCODONE HCL 5 MG PO TABS
5.0000 mg | ORAL_TABLET | Freq: Four times a day (QID) | ORAL | 0 refills | Status: AC | PRN
  Filled 2023-09-28: qty 20, 5d supply, fill #0

## 2023-09-28 NOTE — Progress Notes (Signed)
Patient given discharge instructions medication list and follow up appointments. IV and tele were removed, TOC medications will be picked up prior to discharge. Will discharge home as ordered. Josanne Boerema, Randall An rN

## 2023-09-28 NOTE — Progress Notes (Signed)
      [address]       [place] [address]             [phone]      5 Days Post-Op Procedure(s) (LRB): AORTIC VALVE REPLACEMENT (AVR) USING INSPIRIS RESILIA AORTIC VALVE SIZE 27 MM (N/A) TRANSESOPHAGEAL ECHOCARDIOGRAM (TEE) (N/A) Subjective: Up in the chair, feels well and had a good day yesterday.  No new concerns.  Objective: Vital signs in last 24 hours: Temp:  [97.5 F (36.4 C)-98.7 F (37.1 C)] 98.7 F (37.1 C) (02/19 0414) Pulse Rate:  [91-97] 92 (02/19 0414) Cardiac Rhythm: Normal sinus rhythm (02/18 1900) Resp:  [13-20] 13 (02/19 0414) BP: (111-136)/(75-98) 111/75 (02/19 0414) SpO2:  [93 %-96 %] 94 % (02/19 0414) Weight:  [101.2 kg] 101.2 kg (02/19 0500)     Intake/Output from previous day: 02/18 0701 - 02/19 0700 In: 240 [P.O.:240] Out: -  Intake/Output this shift: No intake/output data recorded.  General appearance: alert, cooperative, and no distress Neurologic: intact Heart: SR, few PAC's Lungs: clear breath sounds, normal work of breathing on RA Extremities: no peripheral edema Wound: the sternotomy incision is clean and dry  Lab Results: Recent Labs    09/26/23 0431  WBC 11.7*  HGB 10.5*  HCT 32.4*  PLT 147*   BMET:  Recent Labs    09/26/23 0431  NA 137  K 3.8  CL 100  CO2 24  GLUCOSE 108*  BUN 24*  CREATININE 1.60*  CALCIUM 9.1    PT/INR: No results for input(s): "LABPROT", "INR" in the last 72 hours. ABG    Component Value Date/Time   PHART 7.387 09/23/2023 1734   HCO3 22.6 09/23/2023 1734   TCO2 24 09/23/2023 1734   ACIDBASEDEF 2.0 09/23/2023 1734   O2SAT 97 09/23/2023 1734   CBG (last 3)  Recent Labs    09/25/23 1642 09/25/23 2130 09/26/23 0756  GLUCAP 129* 100* 160*    Assessment/Plan: S/P Procedure(s) (LRB): AORTIC VALVE REPLACEMENT (AVR) USING INSPIRIS RESILIA AORTIC VALVE SIZE 27 MM (N/A) TRANSESOPHAGEAL ECHOCARDIOGRAM (TEE) (N/A)  -POD5 bioprosthetic AVR for severe AI.  Stable VS and  cardiac rhythm. On ASA, Toprol XL 50mg  daily.   -AKI stage 3: last creat 1.6, holding off on restarting Avapro, amlodipine, and chlorthalidone to avoid hypotension. Wt is ~6lbs below pre-op, no further diuresis needed.  -Disposition- discharge today. Instructions given and follow up has been arranged.    LOS: 5 days    Leary Roca, PA-C 09/28/2023

## 2023-09-28 NOTE — TOC Transition Note (Signed)
Transition of Care [facility]) - Discharge Note Donn Pierini RN, BSN Transitions of Care Unit 4E- RN Case Manager See Treatment Team for direct phone #   Patient Details  Name: Carlos Hayes MRN: [id number] Date of Birth: 04-15-1961  Transition of Care [facility]) CM/SW Contact:  Darrold Span, RN Phone Number: 09/28/2023, 9:52 AM   Clinical Narrative:    Pt stable for transition home today, No HH needs noted, DME- RW arranged with [employer] and has been delivered to room.   Family to provide transport home.   No further TOC needs noted.    Final next level of care: Home/Self Care Barriers to Discharge: Barriers Resolved   Patient Goals and CMS Choice Patient states their goals for this hospitalization and ongoing recovery are:: return home CMS Medicare.gov Compare Post Acute Care list provided to:: Patient        Discharge Placement               Home        Discharge Plan and Services Additional resources added to the After Visit Summary for     Discharge Planning Services: CM Consult Post Acute Care Choice: Durable Medical Equipment          DME Arranged: Dan Humphreys rolling DME Agency: [employer] Date DME Agency Contacted: 09/27/23 Time DME Agency Contacted: [phone] Representative spoke with at DME Agency: Ian Malkin HH Arranged: NA HH Agency: NA        Social Drivers of Health (SDOH) Interventions SDOH Screenings   Food Insecurity: No Food Insecurity (09/23/2023)  Housing: Low Risk  (09/23/2023)  Transportation Needs: No Transportation Needs (09/23/2023)  Utilities: Not At Risk (09/23/2023)  Depression (PHQ2-9): Low Risk  (07/08/2022)  Tobacco Use: Low Risk  (09/23/2023)     Readmission Risk Interventions    09/28/2023    9:52 AM  Readmission Risk Prevention Plan  Post Dischage Appt Complete  Medication Screening Complete  Transportation Screening Complete

## 2023-09-28 NOTE — Plan of Care (Signed)

## 2023-09-28 NOTE — Progress Notes (Signed)
CARDIAC REHAB PHASE I   Pt ready for discharge home. Post OHS education completed. Referral for CRP2 sent to [facility].   Woodroe Chen, RN BSN 09/28/2023 9:22 AM

## 2023-10-03 MED FILL — Sodium Chloride IV Soln 0.9%: INTRAVENOUS | Qty: 300 | Status: CN

## 2023-10-03 MED FILL — Sodium Chloride IV Soln 0.9%: INTRAVENOUS | Qty: 2000 | Status: AC

## 2023-10-03 MED FILL — Sodium Bicarbonate IV Soln 8.4%: INTRAVENOUS | Qty: 50 | Status: AC

## 2023-10-03 MED FILL — Electrolyte-R (PH 7.4) Solution: INTRAVENOUS | Qty: 4000 | Status: AC

## 2023-10-03 MED FILL — Mannitol IV Soln 20%: INTRAVENOUS | Qty: 500 | Status: AC

## 2023-10-03 MED FILL — Heparin Sodium (Porcine) Inj 1000 Unit/ML: INTRAMUSCULAR | Qty: 10 | Status: AC

## 2023-10-07 ENCOUNTER — Ambulatory Visit: Payer: Self-pay

## 2023-10-07 DIAGNOSIS — Z4802 Encounter for removal of sutures: Secondary | ICD-10-CM

## 2023-10-07 NOTE — Progress Notes (Signed)
 Patient arrived for nurse visit to remove suture/staples post- procedure AVR 09/23/23 with Dr. Laneta Simmers.  Incisions crusted over with dried blood. Two Sutures removed with no signs/ symptoms of infection noted.  Patient tolerated procedure well.  Patient/ family instructed to keep the incision sites clean and dry.  Patient/ family acknowledged instructions given.    Patient's family acknowledged that patient has been lifting things above weight limit and that is why he bled from suture sites. He was advised to not lift anything above 10 pound weight limit. He acknowledged receipt.

## 2023-10-10 ENCOUNTER — Ambulatory Visit: Payer: Medicare Other | Attending: Internal Medicine | Admitting: Internal Medicine

## 2023-10-10 ENCOUNTER — Encounter: Payer: Self-pay | Admitting: Internal Medicine

## 2023-10-10 VITALS — BP 124/84 | HR 108 | Ht 67.0 in | Wt 223.8 lb

## 2023-10-10 DIAGNOSIS — E785 Hyperlipidemia, unspecified: Secondary | ICD-10-CM | POA: Insufficient documentation

## 2023-10-10 DIAGNOSIS — I2510 Atherosclerotic heart disease of native coronary artery without angina pectoris: Secondary | ICD-10-CM | POA: Diagnosis present

## 2023-10-10 DIAGNOSIS — Z953 Presence of xenogenic heart valve: Secondary | ICD-10-CM | POA: Insufficient documentation

## 2023-10-10 MED ORDER — AMOXICILLIN 500 MG PO TABS: 2000.0000 mg | ORAL_TABLET | ORAL | 0 refills | Status: AC | PRN

## 2023-10-10 NOTE — Progress Notes (Signed)
 Cardiology Office Note:  .    Date:  10/10/2023  ID:  Garin Mata, DOB Apr 27, 1961, MRN [id number] PCP: Ivonne Andrew, NP  [facility] Providers Cardiologist:  Christell Constant, MD     CC: Follow up Valve surgery  History of Present Illness: .    Discussed the use of AI scribe software for clinical note transcription with the patient, who gave verbal consent to proceed.  Lorenza Winkleman is a 63 year old male who presents for follow-up after aortic valve replacement. He is accompanied by his son, Gay Filler.  He feels good post-surgery and notes that the procedure 'worked perfectly.' He recalls the last visit coinciding with an eclipse, during which he discussed his leaky valve and the need for surgery. Post-surgery, he has no chest pain and can take a full breath. No issues with the surgical scar or abnormal heart rhythms.  Prior to the valve replacement, he had severe aortic regurgitation without any heart remodeling, and his heart function was good. A preoperative evaluation revealed minor blockages or mild nonobstructive coronary artery disease during heart catheterization.  He mentions feeling 'stuffy' but otherwise great. He has been resting since the surgery and has not returned to his usual level of physical activity.  His current medications include rosuvastatin 20 mg and Zetia for hypercholesterolemia, with an LDL goal of under 70. He has an issue with atorvastatin in the past but none with ABX. He is instructed to take two grams of amoxicillin prior to dental work to prevent valve infection.  Relevant histories: .  Social  2022: Started on medical therapy and increased multiple medications.  LDL near goal, BP improved.  Did not get follow up imaging and was lost to f/u in 2023. 2024: Worsening AI 2025, s/p valve surgery.  Son now comes to visits. ROS: As per HPI.   Studies Reviewed: .   Cardiac Studies & Procedures    ______________________________________________________________________________________________ CARDIAC CATHETERIZATION  CARDIAC CATHETERIZATION 11/30/2022  Narrative 1.  Severe stenosis of the nondominant RCA 2.  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  Normal intracardiac hemodynamics with mean PA pressure 22, wedge pressure of 8, LVEDP of 12, RA pressure of 5, preserved cardiac output of 7.5 with index of 3.5, and no evidence of wide pulse pressure with an aortic pressure 133/76  Findings Coronary Findings Diagnostic  Dominance: Left  Left Main Dist LM to Ost LAD lesion is 30% stenosed.  Left Anterior Descending Mid LAD lesion is 40% stenosed.  Ramus Intermedius There is mild diffuse disease throughout the vessel.  Left Circumflex There is mild diffuse disease throughout the vessel.  Second Obtuse Marginal Branch Tortuous but patent vessel, no obstructive disease.  Right Coronary Artery There is mild diffuse disease throughout the vessel. Nondominant but moderate caliber RCA, tortuous vessel, tight focal 80% stenosis in the midportion not amenable to PCI.  The RCA supplies an acute marginal and a conus branch. Prox RCA lesion is 80% stenosed.  Intervention  No interventions have been documented.     ECHOCARDIOGRAM  ECHOCARDIOGRAM COMPLETE 08/11/2023  Narrative ECHOCARDIOGRAM REPORT    Patient Name:   ALEZANDER DIMAANO Date of Exam: 08/11/2023 Medical Rec #:  [id number]         Height:       67.0 in Accession #:    [id number]        Weight:       226.6 lb Date of  Birth:  1961/08/06         BSA:          2.132 m Patient Age:    62 years          BP:           136/60 mmHg Patient Gender: M                 HR:           70 bpm. Exam Location:  [address]  Procedure: 2D Echo, Cardiac Doppler, Color Doppler and Intracardiac Opacification Agent  Indications:    I35.1 Aortic Valve  Insufficiency  History:        Patient has prior history of Echocardiogram examinations, most recent 11/10/2022. TIA; Risk Factors:Dyslipidemia and Hypertension.  Sonographer:    Daphine Deutscher RDCS Referring Phys: [id number] Perlie Gold  IMPRESSIONS   1. Left ventricular ejection fraction, by estimation, is 60 to 65%. The left ventricle has normal function. The left ventricle has no regional wall motion abnormalities. There is mild concentric left ventricular hypertrophy. Left ventricular diastolic parameters are consistent with Grade I diastolic dysfunction (impaired relaxation). 2. Right ventricular systolic function is normal. The right ventricular size is normal. 3. The mitral valve is normal in structure. No evidence of mitral valve regurgitation. No evidence of mitral stenosis. 4. 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 The aortic valve is tricuspid. There is mild calcification of the aortic valve. Aortic valve regurgitation is severe. No aortic stenosis is present. 5. Aortic dilatation noted. There is borderline dilatation of the ascending aorta, measuring 38 mm. 6. The inferior vena cava is normal in size with greater than 50% respiratory variability, suggesting right atrial pressure of 3 mmHg.  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  FINDINGS Left Ventricle: Left ventricular ejection fraction, by estimation, is 60 to 65%. The left ventricle has normal function. The left ventricle has no regional wall motion abnormalities. Definity contrast agent was given IV to delineate the left ventricular endocardial borders. The left ventricular internal cavity size was normal in size. There is mild concentric left ventricular hypertrophy. Left ventricular diastolic parameters are consistent with Grade I diastolic dysfunction (impaired relaxation).  Right Ventricle: The right  ventricular size is normal. No increase in right ventricular wall thickness. Right ventricular systolic function is normal.  Left Atrium: Left atrial size was normal in size.  Right Atrium: Right atrial size was normal in size.  Pericardium: There is no evidence of pericardial effusion.  Mitral Valve: The mitral valve is normal in structure. No evidence of mitral valve regurgitation. No evidence of mitral valve stenosis.  Tricuspid Valve: The tricuspid valve is normal in structure. Tricuspid valve regurgitation is trivial. No evidence of tricuspid stenosis.  Aortic Valve: Severe eccentric AI with holodiastolic flow reversal in the descending aorta. The aortic valve is tricuspid. There is mild calcification of the aortic valve. Aortic valve regurgitation is severe. No aortic stenosis is present.  Pulmonic Valve: The pulmonic valve was normal in structure. Pulmonic valve regurgitation is mild. No evidence of pulmonic stenosis.  Aorta: Aortic dilatation noted. There is borderline dilatation of the ascending aorta, measuring 38 mm.  Venous: The inferior vena cava is normal in size with greater than 50% respiratory variability, suggesting right atrial pressure of 3 mmHg.  IAS/Shunts: No atrial level shunt detected by color flow Doppler.   LEFT VENTRICLE PLAX 2D LVIDd:  5.00 cm   Diastology LVIDs:         3.30 cm   LV e' medial:    6.04 cm/s LV PW:         1.10 cm   LV E/e' medial:  7.9 LV IVS:        1.10 cm   LV e' lateral:   6.86 cm/s LVOT diam:     2.30 cm   LV E/e' lateral: 6.9 LV SV:         91 LV SV Index:   43 LVOT Area:     4.15 cm   RIGHT VENTRICLE RV Basal diam:  4.20 cm RV S prime:     14.95 cm/s TAPSE (M-mode): 2.1 cm  LEFT ATRIUM             Index        RIGHT ATRIUM           Index LA diam:        4.00 cm 1.88 cm/m   RA Area:     13.60 cm LA Vol (A2C):   33.5 ml 15.71 ml/m  RA Volume:   34.60 ml  16.23 ml/m LA Vol (A4C):   41.4 ml 19.41 ml/m LA  Biplane Vol: 38.3 ml 17.96 ml/m AORTIC VALVE LVOT Vmax:   104.83 cm/s LVOT Vmean:  67.600 cm/s LVOT VTI:    0.218 m  AORTA Ao Root diam: 3.50 cm Ao Asc diam:  3.80 cm  MITRAL VALVE MV Area (PHT): 4.23 cm    SHUNTS MV Decel Time: 180 msec    Systemic VTI:  0.22 m MV E velocity: 47.60 cm/s  Systemic Diam: 2.30 cm MV A velocity: 58.10 cm/s MV E/A ratio:  0.82  Arvilla Meres MD Electronically signed by Arvilla Meres MD Signature Date/Time: 08/11/2023/6:28:25 PM    Final   TEE  ECHO INTRAOPERATIVE TEE 09/23/2023  Narrative *INTRAOPERATIVE TRANSESOPHAGEAL REPORT *    Patient Name:   SAMARION EHLE Date of Exam: 09/23/2023 Medical Rec #:  [id number]         Height:       67.0 in Accession #:    [id number]        Weight:       229.0 lb Date of Birth:  1961/06/13         BSA:          2.14 m Patient Age:    62 years          BP:           160/75 mmHg Patient Gender: M                 HR:           69 bpm. Exam Location:  Anesthesiology  Transesophogeal exam was perform intraoperatively during surgical procedure. Patient was closely monitored under general anesthesia during the entirety of examination.  Indications:     I35.1 Nonrheumatic aortic (valve) insufficiency Sonographer:     Irving Burton Senior RDCS Performing Phys: [id number] Alleen Borne Diagnosing Phys: Lewie Loron MD  Complications: No known complications during this procedure. POST-OP IMPRESSIONS _ Left Ventricle: The left ventricle is unchanged from pre-bypass. _ Right Ventricle: The right ventricle appears unchanged from pre-bypass. _ Aorta: The aorta appears unchanged from pre-bypass. _ Left Atrial Appendage: The left atrial appendage appears unchanged from pre-bypass. _ Aortic Valve: No stenosis present. A bioprosthetic valve was placed, leaflets thin and leaflets are freely mobile. There is  no regurgitation. _ Mitral Valve: The mitral valve appears unchanged from pre-bypass. _ Tricuspid Valve: The  tricuspid valve appears unchanged from pre-bypass. _ Pulmonic Valve: The pulmonic valve appears unchanged from pre-bypass. _ Interatrial Septum: The interatrial septum appears unchanged from pre-bypass.  PRE-OP FINDINGS Left Ventricle: The left ventricle has normal systolic function, with an ejection fraction of 60-65%. The cavity size was mildly dilated. There is severe concentric left ventricular hypertrophy.   Right Ventricle: The right ventricle has normal systolic function. The cavity was dialated. There is no increase in right ventricular wall thickness.  Left Atrium: Left atrial size was dilated. No left atrial/left atrial appendage thrombus was detected.  Right Atrium: Right atrial size was dilated.  Interatrial Septum: No atrial level shunt detected by color flow Doppler. There is right bowing of the interatrial septum, suggestive of elevated left atrial pressure.  Pericardium: A small pericardial effusion is present.  Mitral Valve: The mitral valve is normal in structure. Mitral valve regurgitation is trivial by color flow Doppler. There is no evidence of mitral valve vegetation.  Tricuspid Valve: The tricuspid valve was normal in structure. Tricuspid valve regurgitation is mild by color flow Doppler. There is no evidence of tricuspid valve vegetation.  Aortic Valve: The aortic valve is tricuspid Aortic valve regurgitation is severe by color flow Doppler. There is no stenosis of the aortic valve. There is no evidence of aortic valve vegetation.   Pulmonic Valve: The pulmonic valve was normal in structure. Pulmonic valve regurgitation is trivial by color flow Doppler.   Aorta: There is evidence of plaque in the descending aorta; Grade III, measuring 3-26mm in size.  +-------------+-----------++ AORTIC VALVE             +-------------+-----------++ AV Vmax:     139.50 cm/s +-------------+-----------++ AV Vmean:    95.400 cm/s +-------------+-----------++ AV  VTI:      0.304 m     +-------------+-----------++ AV Peak Grad:7.8 mmHg    +-------------+-----------++ AV Mean Grad:4.0 mmHg    +-------------+-----------++ AR PHT:      223 msec    +-------------+-----------++   Lewie Loron MD Electronically signed by Lewie Loron MD Signature Date/Time: 09/23/2023/2:22:26 PM    Final        ______________________________________________________________________________________________      Physical Exam:    VS:  BP 124/84   Pulse (!) 108   Ht 5\' 7"  (1.702 m)   Wt 223 lb 12.8 oz (101.5 kg)   SpO2 96%   BMI 35.05 kg/m    Wt Readings from Last 3 Encounters:  10/10/23 223 lb 12.8 oz (101.5 kg)  09/28/23 223 lb (101.2 kg)  09/21/23 229 lb 12.8 oz (104.2 kg)    Gen: no distress   Neck: No JVD Cardiac: No Rubs or Gallops, systolic murmur, regular tachycardia, +2 radial pulses Respiratory: Clear to auscultation bilaterally, normal effort, normal  respiratory rate GI: Soft, nontender, non-distended  MS: No  edema;  moves all extremities Integument: clean incision Neuro:  At time of evaluation, alert and oriented to person/place/time/situation  Psych: Normal affect, patient feels great   ASSESSMENT AND PLAN: .    Status Post Aortic Valve Replacement Mr. Taboada is status post aortic valve replacement for severe aortic regurgitation. Follow-up echocardiogram shows no heart remodeling, and heart function remains good. He reports no chest pain or shortness of breath. Physical exam reveals a well-healed scar, regular heart rate, and no signs of congestive heart failure. Slightly elevated heart rate likely due to deconditioning post-surgery.  Discussed importance of cardiac rehab and antibiotic prophylaxis before dental procedures to prevent endocarditis. - Refer to cardiac rehab - Continue current medications - Schedule follow-up echocardiogram - Administer 2 grams amoxicillin prior to dental work - Follow-up with Dr.  Laneta Simmers on October 24, 2023   Coronary Artery Disease Mr. Wessell has mild nonobstructive coronary artery disease identified during preoperative evaluation. He is on rosuvastatin 20 mg and Zetia with an LDL goal of under 70.  On ASA - Repeat cholesterol labs at next follow-up visit - Discuss potential use of PCSK9 inhibitors if atorvastatin intolerance persists  Hypercholesterolemia Mr. Sainvil is managed for hypercholesterolemia with rosuvastatin 20 mg and Zetia. LDL goal is under 70. - Repeat cholesterol labs at next follow-up visit - Discuss potential use of PCSK9 inhibitors if atorvastatin intolerance persists.  F/u - Follow-up with Perlie Gold in July 2025 - Annual follow-up with me in one year  Riley Lam, MD FASE [facility] Cardiologist [facility]  [address] [place], [address] [place], [place] [address] [phone]  5:04 PM

## 2023-10-10 NOTE — Patient Instructions (Signed)
 Medication Instructions:  Your physician has recommended you make the following change in your medication:  START: amoxicillin 2000 mg by mouth prior to all dental work   *If you need a refill on your cardiac medications before your next appointment, please call your pharmacy*   Lab Work: NONE  If you have labs (blood work) drawn today and your tests are completely normal, you will receive your results only by: MyChart Message (if you have MyChart) OR A paper copy in the mail If you have any lab test that is abnormal or we need to change your treatment, we will call you to review the results.   Testing/Procedures: NONE   Follow-Up: At [facility], you and your health needs are our priority.  As part of our continuing mission to provide you with exceptional heart care, we have created designated Provider Care Teams.  These Care Teams include your primary Cardiologist (physician) and Advanced Practice Providers (APPs -  Physician Assistants and Nurse Practitioners) who all work together to provide you with the care you need, when you need it.  We recommend signing up for the patient portal called "MyChart".  Sign up information is provided on this After Visit Summary.  MyChart is used to connect with patients for Virtual Visits (Telemedicine).  Patients are able to view lab/test results, encounter notes, upcoming appointments, etc.  Non-urgent messages can be sent to your provider as well.   To learn more about what you can do with MyChart, go to [email or web address].    Your next appointment:   4 month(s)  Provider:   Perlie Gold, PA-C

## 2023-10-11 ENCOUNTER — Telehealth (HOSPITAL_COMMUNITY): Payer: Self-pay

## 2023-10-11 NOTE — Telephone Encounter (Signed)
 Attempted to call patient in regards to Cardiac Rehab, unable to leave VM, VM box not set up.

## 2023-10-21 ENCOUNTER — Other Ambulatory Visit: Payer: Self-pay | Admitting: Surgery

## 2023-10-21 DIAGNOSIS — I351 Nonrheumatic aortic (valve) insufficiency: Secondary | ICD-10-CM

## 2023-10-24 ENCOUNTER — Ambulatory Visit (INDEPENDENT_AMBULATORY_CARE_PROVIDER_SITE_OTHER): Payer: Self-pay | Admitting: Surgical

## 2023-10-24 VITALS — BP 167/104 | HR 79 | Resp 18 | Ht 67.0 in | Wt 220.0 lb

## 2023-10-24 DIAGNOSIS — Z953 Presence of xenogenic heart valve: Secondary | ICD-10-CM

## 2023-10-24 NOTE — Progress Notes (Signed)
 [address]       [place] [address]             [phone]      [facility] Medical Record #[id number] Date of Birth: 01/28/1961  Referring: Ivonne Andrew, NP Primary Care: Ivonne Andrew, NP Primary Cardiologist: Christell Constant, MD   Chief Complaint:   POST OP FOLLOW UP  09/23/2023 Carlos Hayes [id number]   Surgeon:  Alleen Borne, MD   First Assistant: Jillyn Hidden, PA-C: An experienced assistant was required given the complexity of this surgery and the standard of surgical care. The assistant was needed for exposure, dissection, suctioning, retraction of delicate tissues and sutures, instrument exchange and for overall help during this procedure.      Preoperative Diagnosis:  Severe aortic insufficiency     Postoperative Diagnosis:  Same     Procedure:   Median Sternotomy Extracorporeal circulation 3.   Aortic valve replacement using a 27 mm Edwards INSPIRIS RESILIA pericardial valve.   Anesthesia:  General    History of Present Illness patient is a 63 year old male seen in the office on today's date status post the above described procedure.  Overall he reports doing well.  He denies chest pain or shortness of breath.  He is not having any difficulties with his incisions.  He is not having palpitations or lower extremity edema.  He is not having fevers, chills or other significant constitutional symptoms.      Past Medical History:  Diagnosis Date   Aortic insufficiency    Arthritis    Chronic kidney disease, stage 3 (HCC)    Gout    Heart murmur    Hyperlipidemia    Hypertension    Stroke (HCC)    TIA   TIA (transient ischemic attack)    Umbilical hernia      Social History   Tobacco Use  Smoking Status Never  Smokeless Tobacco Never    Social History   Substance and Sexual Activity  Alcohol Use Yes   Comment: occasionally - gin     Allergies  Allergen Reactions    Lipitor [Atorvastatin] Swelling    Causes lip to swell    Current Outpatient Medications  Medication Sig Dispense Refill   acetaminophen (TYLENOL) 325 MG tablet Take 2 tablets (650 mg total) by mouth every 6 (six) hours.     amoxicillin (AMOXIL) 500 MG tablet Take 4 tablets (2,000 mg total) by mouth as needed (take prior to dental work). 8 tablet 0   APPLE CIDER VINEGAR PO Take 800 mg by mouth daily. 400 mg each     aspirin EC 325 MG tablet Take 1 tablet (325 mg total) by mouth daily. 100 tablet 3   Cinnamon 500 MG capsule Take 500 mg by mouth daily.     colchicine 0.6 MG tablet TAKE 1 TABLET (0.6MG  TOTAL) BY MOUTH DAILY AS NEEDED FOR GOUT 90 tablet 1   cyanocobalamin (VITAMIN B12) 1000 MCG tablet Take 1,000 mcg by mouth daily.     ezetimibe (ZETIA) 10 MG tablet TAKE 1 TABLET BY MOUTH EVERY DAY 90 tablet 3   metoprolol succinate (TOPROL-XL) 50 MG 24 hr tablet Take 1 tablet (50 mg total) by mouth daily. Take with or immediately following a meal. 30 tablet 2   tetrahydrozoline 0.05 % ophthalmic solution Place 1 drop into both eyes daily as needed (Dry eyes).     rosuvastatin (CRESTOR) 20 MG tablet Take  1 tablet (20 mg total) by mouth daily. 90 tablet 3   No current facility-administered medications for this visit.       Physical Exam: BP (!) 167/104   Pulse 79   Resp 18   Ht 5\' 7"  (1.702 m)   Wt 220 lb (99.8 kg)   SpO2 96%   BMI 34.46 kg/m   General appearance: alert, cooperative, and no distress Heart: Regular rate and rhythm, no murmur, no rub Extremities: No edema Incisions: Well-healed without evidence of infection Pulmonary: Lungs clear throughout   Diagnostic Studies & Laboratory data:     Recent Radiology Findings: Patient did not get x-ray today  No results found.    Recent Lab Findings: Lab Results  Component Value Date   WBC 11.7 (H) 09/26/2023   HGB 10.5 (L) 09/26/2023   HCT 32.4 (L) 09/26/2023   PLT 147 (L) 09/26/2023   GLUCOSE 108 (H) 09/26/2023    CHOL 133 07/08/2022   TRIG 92 07/08/2022   HDL 41 07/08/2022   LDLDIRECT 74 06/28/2023   LDLCALC 74 07/08/2022   ALT 33 09/21/2023   AST 25 09/21/2023   NA 137 09/26/2023   K 3.8 09/26/2023   CL 100 09/26/2023   CREATININE 1.60 (H) 09/26/2023   BUN 24 (H) 09/26/2023   CO2 24 09/26/2023   TSH 1.130 06/30/2021   INR 1.5 (H) 09/23/2023   HGBA1C 6.0 (H) 09/21/2023      Assessment / Plan: Patient is making good ongoing overall progress.  His blood pressure is noted to be elevated on today's date.  He discussed keeping further observation of blood pressures with a diary.  He does have a blood pressure monitor at home and he appears to understand and knows he will need to follow this closely with his primary care doctor.  He did not realize he needed to obtain a chest x-ray today and did not stop at radiology.  Lung sounds are nice and clear and he is functioning very well.  He has already begun driving and I gave him further instructions regarding this.  Also gave him further instructions regarding activity progression including lifting restrictions.  I did not make any changes to his current medication regimen.  We will see the patient again on a as needed basis for any surgically related needs or at request.      Medication Changes: No orders of the defined types were placed in this encounter.     Rowe Clack, PA-C  10/24/2023 1:59 PM

## 2023-10-24 NOTE — Patient Instructions (Signed)
 Activity progression including lifting restrictions as discussed.  Driving progression as discussed. Keep a blood pressure diary as discussed.

## 2023-10-28 ENCOUNTER — Encounter (HOSPITAL_COMMUNITY): Payer: Self-pay

## 2023-10-28 ENCOUNTER — Telehealth (HOSPITAL_COMMUNITY): Payer: Self-pay

## 2023-10-28 NOTE — Telephone Encounter (Signed)
Attempted to call patient in regards to Cardiac Rehab - unable to leave VM, VM box full. °  °Mailed letter °

## 2023-11-04 ENCOUNTER — Ambulatory Visit (HOSPITAL_COMMUNITY): Payer: 59 | Attending: Internal Medicine

## 2023-11-04 DIAGNOSIS — Z9889 Other specified postprocedural states: Secondary | ICD-10-CM | POA: Insufficient documentation

## 2023-11-04 DIAGNOSIS — Z952 Presence of prosthetic heart valve: Secondary | ICD-10-CM | POA: Diagnosis not present

## 2023-11-04 LAB — ECHOCARDIOGRAM COMPLETE
AV Mean grad: 3.2 mmHg
AV Peak grad: 5.7 mmHg
Ao pk vel: 1.19 m/s
Area-P 1/2: 4.18 cm2
S' Lateral: 2.5 cm

## 2023-11-04 MED ORDER — PERFLUTREN LIPID MICROSPHERE
1.0000 mL | INTRAVENOUS | Status: AC | PRN
  Administered 2023-11-04: 1 mL via INTRAVENOUS

## 2023-11-21 ENCOUNTER — Telehealth (HOSPITAL_COMMUNITY): Payer: Self-pay

## 2023-11-21 NOTE — Telephone Encounter (Signed)
 No response from pt in regards cardiac rehab. Unable to leave message, no vm setup.  Closed referral.

## 2024-01-23 ENCOUNTER — Encounter: Payer: Self-pay | Admitting: Internal Medicine

## 2024-03-28 ENCOUNTER — Other Ambulatory Visit: Payer: Self-pay | Admitting: Internal Medicine

## 2024-07-03 ENCOUNTER — Telehealth: Payer: Self-pay | Admitting: Internal Medicine

## 2024-07-03 NOTE — Telephone Encounter (Signed)
 Paper Work Dropped Off: Handicap form  Date: 07/03/24   Location of paper:  Dr [facility] box Patient would like for form to be mailed when complete.

## 2024-07-04 NOTE — Telephone Encounter (Signed)
 Attempted to call patient to advise disabilty placard request form is signed and will be mailed back to him, he would now need to take to [facility]. No answer, no VM set up. No [facility] account.

## 2024-09-04 ENCOUNTER — Emergency Department (HOSPITAL_COMMUNITY)

## 2024-09-04 ENCOUNTER — Emergency Department (HOSPITAL_COMMUNITY): Admission: EM | Admit: 2024-09-04 | Discharge: 2024-09-04 | Disposition: A | Discharge status: Home / Self Care

## 2024-09-04 ENCOUNTER — Other Ambulatory Visit: Payer: Self-pay

## 2024-09-04 ENCOUNTER — Encounter (HOSPITAL_COMMUNITY): Payer: Self-pay

## 2024-09-04 DIAGNOSIS — Z952 Presence of prosthetic heart valve: Secondary | ICD-10-CM | POA: Diagnosis not present

## 2024-09-04 DIAGNOSIS — S0990XA Unspecified injury of head, initial encounter: Secondary | ICD-10-CM

## 2024-09-04 DIAGNOSIS — Z79899 Other long term (current) drug therapy: Secondary | ICD-10-CM | POA: Diagnosis not present

## 2024-09-04 DIAGNOSIS — Z7982 Long term (current) use of aspirin: Secondary | ICD-10-CM | POA: Insufficient documentation

## 2024-09-04 DIAGNOSIS — W0110XA Fall on same level from slipping, tripping and stumbling with subsequent striking against unspecified object, initial encounter: Secondary | ICD-10-CM | POA: Diagnosis not present

## 2024-09-04 DIAGNOSIS — I10 Essential (primary) hypertension: Secondary | ICD-10-CM | POA: Insufficient documentation

## 2024-09-04 DIAGNOSIS — S0101XA Laceration without foreign body of scalp, initial encounter: Secondary | ICD-10-CM | POA: Diagnosis not present

## 2024-09-04 DIAGNOSIS — Y9301 Activity, walking, marching and hiking: Secondary | ICD-10-CM | POA: Diagnosis not present

## 2024-09-04 LAB — CBC WITH DIFFERENTIAL/PLATELET
Abs Immature Granulocytes: 0.02 10*3/uL (ref 0.00–0.07)
Basophils Absolute: 0 10*3/uL (ref 0.0–0.1)
Basophils Relative: 1 %
Eosinophils Absolute: 0.1 10*3/uL (ref 0.0–0.5)
Eosinophils Relative: 2 %
HCT: 48.1 % (ref 39.0–52.0)
Hemoglobin: 15.2 g/dL (ref 13.0–17.0)
Immature Granulocytes: 0 %
Lymphocytes Relative: 28 %
Lymphs Abs: 1.4 10*3/uL (ref 0.7–4.0)
MCH: 25.9 pg — ABNORMAL LOW (ref 26.0–34.0)
MCHC: 31.6 g/dL (ref 30.0–36.0)
MCV: 81.9 fL (ref 80.0–100.0)
Monocytes Absolute: 0.5 10*3/uL (ref 0.1–1.0)
Monocytes Relative: 10 %
Neutro Abs: 2.9 10*3/uL (ref 1.7–7.7)
Neutrophils Relative %: 59 %
Platelets: 248 10*3/uL (ref 150–400)
RBC: 5.87 MIL/uL — ABNORMAL HIGH (ref 4.22–5.81)
RDW: 14.6 % (ref 11.5–15.5)
WBC: 4.8 10*3/uL (ref 4.0–10.5)
nRBC: 0 % (ref 0.0–0.2)

## 2024-09-04 LAB — I-STAT CHEM 8, ED
BUN: 21 mg/dL (ref 8–23)
Calcium, Ion: 1.15 mmol/L (ref 1.15–1.40)
Chloride: 105 mmol/L (ref 98–111)
Creatinine, Ser: 1.8 mg/dL — ABNORMAL HIGH (ref 0.61–1.24)
Glucose, Bld: 100 mg/dL — ABNORMAL HIGH (ref 70–99)
HCT: 48 % (ref 39.0–52.0)
Hemoglobin: 16.3 g/dL (ref 13.0–17.0)
Potassium: 3.8 mmol/L (ref 3.5–5.1)
Sodium: 142 mmol/L (ref 135–145)
TCO2: 23 mmol/L (ref 22–32)

## 2024-09-04 NOTE — ED Provider Triage Note (Signed)
 Emergency Medicine Provider Triage Evaluation Note  Carlos Hayes , a 64 y.o. male  was evaluated in triage.  Pt complains of fall. Today pt went out side, slipped on ice and fell backward striking the back of the head against ground.  No loc.  Endorse mild headache, no neck pain, no precipitating sxs prior to the fall.  Not on blood thinner.  Hx of HTN but not on any antihypertensive meds  Review of Systems  Positive: As above Negative: As above  Physical Exam  BP (!) 204/136 (BP Location: Right Arm)   Pulse 88   Temp 98.5 F (36.9 C)   Resp 17   Ht 5' 7 (1.702 m)   Wt 99.8 kg   SpO2 92%   BMI 34.46 kg/m  Gen:   Awake, no distress   Resp:  Normal effort  MSK:   Moves extremities without difficulty  Other:  Lac to occiput of scalp  Medical Decision Making  Medically screening exam initiated at 12:40 PM.  Appropriate orders placed.  Carlos Hayes was informed that the remainder of the evaluation will be completed by another provider, this initial triage assessment does not replace that evaluation, and the importance of remaining in the ED until their evaluation is complete.     Nivia Colon, PA-C 09/04/24 1241

## 2024-09-04 NOTE — ED Provider Notes (Signed)
 " [place] [facility] Provider Note   CSN: [id number] Arrival date & time: 09/04/24  1224     Patient presents with: Fall and Head Injury   Carlos Hayes is a 64 y.o. male.    Fall Pertinent negatives include no chest pain, no abdominal pain and no shortness of breath.  Head Injury Associated symptoms: no seizures and no vomiting     States he was walking whenever he fell and subsequently slid back hit the back of his head.  Did not lose consciousness.  No neck pain or back pain.  No chest pain or shortness of breath before or after the fall.  No shoulder pain.  His middle ambulate after the accident.  States he has a small laceration to the top of his head.     Previous medical history reviewed : Follows  with cardiology in the setting of previous aortic valve replacement.   Prior to Admission medications  Medication Sig Start Date End Date Taking? Authorizing Provider  acetaminophen  (TYLENOL ) 325 MG tablet Take 2 tablets (650 mg total) by mouth every 6 (six) hours. 09/28/23   Roddenberry, Myron G, PA-C  amoxicillin  (AMOXIL ) 500 MG tablet Take 4 tablets (2,000 mg total) by mouth as needed (take prior to dental work). 10/10/23   Chandrasekhar, Stanly LABOR, MD  APPLE CIDER VINEGAR PO Take 800 mg by mouth daily. 400 mg each    [provider]  aspirin  EC 325 MG tablet Take 1 tablet (325 mg total) by mouth daily. 09/28/23   Roddenberry, Myron G, PA-C  Cinnamon 500 MG capsule Take 500 mg by mouth daily.    [provider]  colchicine  0.6 MG tablet TAKE 1 TABLET (0.6MG  TOTAL) BY MOUTH DAILY AS NEEDED FOR GOUT 03/30/24   Chandrasekhar, Mahesh A, MD  cyanocobalamin (VITAMIN B12) 1000 MCG tablet Take 1,000 mcg by mouth daily.    [provider]  ezetimibe  (ZETIA ) 10 MG tablet TAKE 1 TABLET BY MOUTH EVERY DAY 09/15/23   Chandrasekhar, Mahesh A, MD  metoprolol  succinate (TOPROL -XL) 50 MG 24 hr tablet Take 1 tablet (50 mg total) by  mouth daily. Take with or immediately following a meal. 09/28/23   Roddenberry, Myron G, PA-C  rosuvastatin  (CRESTOR ) 20 MG tablet Take 1 tablet (20 mg total) by mouth daily. 07/04/23 10/10/23  Williams, Evan, PA-C  tetrahydrozoline 0.05 % ophthalmic solution Place 1 drop into both eyes daily as needed (Dry eyes).    [provider]    Allergies: Lipitor  [atorvastatin ]    Review of Systems  Constitutional:  Negative for chills and fever.  HENT:  Negative for ear pain and sore throat.   Eyes:  Negative for pain and visual disturbance.  Respiratory:  Negative for cough and shortness of breath.   Cardiovascular:  Negative for chest pain and palpitations.  Gastrointestinal:  Negative for abdominal pain and vomiting.  Genitourinary:  Negative for dysuria and hematuria.  Musculoskeletal:  Negative for arthralgias and back pain.  Skin:  Negative for color change and rash.  Neurological:  Negative for seizures and syncope.  All other systems reviewed and are negative.   Updated Vital Signs BP (!) 199/153 (BP Location: Left Arm)   Pulse 88   Temp 97.6 F (36.4 C) (Oral)   Resp 18   Ht 5' 7 (1.702 m)   Wt 99.8 kg   SpO2 97%   BMI 34.46 kg/m   Physical Exam Vitals and nursing note reviewed.  Constitutional:  General: He is not in acute distress.    Appearance: He is well-developed.  HENT:     Head: Normocephalic and atraumatic.   Eyes:     Conjunctiva/sclera: Conjunctivae normal.  Cardiovascular:     Rate and Rhythm: Normal rate and regular rhythm.     Heart sounds: No murmur heard. Pulmonary:     Effort: Pulmonary effort is normal. No respiratory distress.     Breath sounds: Normal breath sounds.  Abdominal:     Palpations: Abdomen is soft.     Tenderness: There is no abdominal tenderness.  Musculoskeletal:        General: No swelling.     Cervical back: Neck supple.  Skin:    General: Skin is warm and dry.     Capillary Refill: Capillary refill takes less  than 2 seconds.  Neurological:     Mental Status: He is alert.  Psychiatric:        Mood and Affect: Mood normal.     (all labs ordered are listed, but only abnormal results are displayed) Labs Reviewed  CBC WITH DIFFERENTIAL/PLATELET - Abnormal; Notable for the following components:      Result Value   RBC 5.87 (*)    MCH 25.9 (*)    All other components within normal limits  I-STAT CHEM 8, ED - Abnormal; Notable for the following components:   Creatinine, Ser 1.80 (*)    Glucose, Bld 100 (*)    All other components within normal limits    EKG: EKG Interpretation Date/Time:  Tuesday September 04 2024 13:18:20 EST Ventricular Rate:  85 PR Interval:  172 QRS Duration:  90 QT Interval:  380 QTC Calculation: 452 R Axis:   103  Text Interpretation: Sinus rhythm with occasional Premature ventricular complexes Rightward axis Abnormal ECG When compared with ECG of 24-Sep-2023 06:54, PREVIOUS ECG IS PRESENT Confirmed by Simon Rea [phone]) on 09/04/2024 2:08:17 PM  Radiology: CT Head Wo Contrast Result Date: 09/04/2024 EXAM: CT HEAD WITHOUT CONTRAST 09/04/2024 01:11:48 PM TECHNIQUE: CT of the head was performed without the administration of intravenous contrast. Automated exposure control, iterative reconstruction, and/or weight based adjustment of the mA/kV was utilized to reduce the radiation dose to as low as reasonably achievable. COMPARISON: CT head 09/26/2020 and MRI brain 09/26/2021. CLINICAL HISTORY: Polytrauma, blunt. FINDINGS: BRAIN AND VENTRICLES: No acute hemorrhage. No evidence of acute territorial infarct. No hydrocephalus. No extra-axial collection. No significant mass effect or midline shift. There is mild diffuse parenchymal volume loss, slightly advanced for the patient's stated age. There are moderate intermittent white matter changes, slightly progressive compared to prior exam. Increased conspicuity of a chronic left lenticulostriate and smaller age-indeterminate right  basal ganglia infarctions. 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 ORBITS: No acute abnormality. SINUSES: No acute abnormality. SOFT TISSUES AND SKULL: No acute soft tissue abnormality. No skull fracture. No calvarial fracture. IMPRESSION: 1. No acute intracranial hemorrhage or calvarial fracture. 2. Since 09/26/2020, progressive small vessel ischemic changes and increased conspicuity of bilateral basal ganglia infarctions. 3. 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 Recommend nonemergent MRI sella if not previously performed. Electronically signed by: Prentice Spade MD 09/04/2024 01:55 PM EST RP Workstation: [id number]     Procedures   Medications Ordered in the ED - No data to display  Medical Decision Making    HPI:   States he was walking whenever he fell and subsequently slid back hit the back of his head.  Did not lose consciousness.  No neck pain or back pain.  No chest pain or shortness of breath before or after the fall.  No shoulder pain.  His middle ambulate after the accident.  States he has a small laceration to the top of his head.     Previous medical history reviewed : Follows  with cardiology in the setting of previous aortic valve replacement.   MDM:   Upon examination, patient hemodynamically stable. A&O x 3 with GCS 15.  CT head obtained to rule out subdural epidural.  Is unremarkable.  No acute intracranial pathology was seen.  Does have some chronic changes.  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  Patient asymptomatic from the standpoint.  No vision changes.  No visual field cuts.  He can follow-up with neurosurgery outpatient for MRI imaging.  Head show small laceration to the occipital area.  Superficial.  No indication for staples.  No cervical thoracic or lumbar pain to palpation.  No indication for imaging at  this time.  No concerns any kind of vertebral fracture.  Labs unremarkable.  EKG sinus rhythm.  No concerns for any kind of syncope.  Mechanical fall.  EKG Interpreted by Me: sinus      I have independently interpreted the  CT  images and agree with the radiologist finding   Social Determinant of Health: denies alcohol    Disposition and Follow Up: NS       Final diagnoses:  Injury of head, initial encounter  Laceration of scalp, initial encounter    ED Discharge Orders     None          Simon Lavonia SAILOR, MD 09/04/24 1604  "

## 2024-09-04 NOTE — ED Notes (Signed)
 Patient found sitting upright, self supported in wheelchair without distress. Patient denied pain, headache or any vision disturbance. Patient confirmed he is comfortable in the wheelchair and is waiting for CT transport.

## 2024-09-04 NOTE — Discharge Instructions (Signed)
 Follow-up with neurosurgery for the reasons we discussed.  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  This needs to have an MRI done.  Please follow-up with neurosurgery.  They can help schedule this MRI brain.  Your blood pressure slightly high.  Please keep a good eye on your blood pressure at home.  Take it twice per day.  Write it down and take your information to your primary care physician so that way we know how to adjust your medication.  Please wash your head when you get home.  The wound does not need sutures.

## 2024-09-04 NOTE — ED Triage Notes (Signed)
 Pt. Stated, I was walking to the dumpster and slipped on ice and hit my head, it was bleeding.

## 2024-09-17 ENCOUNTER — Inpatient Hospital Stay: Payer: Self-pay | Admitting: Nurse Practitioner
